# Patient Record
Sex: Male | Born: 1945
Health system: Southern US, Community
[De-identification: ages and names within clinical notes are randomized; demographics above are authoritative.]

## PROBLEM LIST (undated history)

## (undated) DIAGNOSIS — N189 Chronic kidney disease, unspecified: Secondary | ICD-10-CM

## (undated) DIAGNOSIS — M1990 Unspecified osteoarthritis, unspecified site: Secondary | ICD-10-CM

## (undated) DIAGNOSIS — J189 Pneumonia, unspecified organism: Secondary | ICD-10-CM

## (undated) DIAGNOSIS — E785 Hyperlipidemia, unspecified: Secondary | ICD-10-CM

## (undated) DIAGNOSIS — I4891 Unspecified atrial fibrillation: Secondary | ICD-10-CM

## (undated) DIAGNOSIS — K219 Gastro-esophageal reflux disease without esophagitis: Secondary | ICD-10-CM

## (undated) DIAGNOSIS — Z87442 Personal history of urinary calculi: Secondary | ICD-10-CM

## (undated) DIAGNOSIS — R51 Headache: Secondary | ICD-10-CM

## (undated) DIAGNOSIS — J302 Other seasonal allergic rhinitis: Secondary | ICD-10-CM

## (undated) DIAGNOSIS — Z9889 Other specified postprocedural states: Secondary | ICD-10-CM

## (undated) DIAGNOSIS — I2510 Atherosclerotic heart disease of native coronary artery without angina pectoris: Secondary | ICD-10-CM

## (undated) DIAGNOSIS — I10 Essential (primary) hypertension: Secondary | ICD-10-CM

## (undated) DIAGNOSIS — N200 Calculus of kidney: Secondary | ICD-10-CM

## (undated) DIAGNOSIS — Z8711 Personal history of peptic ulcer disease: Secondary | ICD-10-CM

## (undated) DIAGNOSIS — I701 Atherosclerosis of renal artery: Secondary | ICD-10-CM

## (undated) DIAGNOSIS — R0602 Shortness of breath: Secondary | ICD-10-CM

## (undated) DIAGNOSIS — R519 Headache, unspecified: Secondary | ICD-10-CM

## (undated) DIAGNOSIS — I6529 Occlusion and stenosis of unspecified carotid artery: Secondary | ICD-10-CM

## (undated) HISTORY — DX: Calculus of kidney: N20.0

## (undated) HISTORY — DX: Hyperlipidemia, unspecified: E78.5

## (undated) HISTORY — DX: Occlusion and stenosis of unspecified carotid artery: I65.29

## (undated) HISTORY — PX: LITHOTRIPSY: SUR834

## (undated) HISTORY — DX: Atherosclerosis of renal artery: I70.1

## (undated) HISTORY — DX: Other specified postprocedural states: Z98.890

---

## 2006-02-03 ENCOUNTER — Ambulatory Visit (HOSPITAL_COMMUNITY): Admission: RE | Admit: 2006-02-03 | Discharge: 2006-02-03 | Payer: Self-pay | Admitting: Family Medicine

## 2007-04-14 ENCOUNTER — Ambulatory Visit (HOSPITAL_COMMUNITY): Admission: RE | Admit: 2007-04-14 | Discharge: 2007-04-14 | Payer: Self-pay | Admitting: Family Medicine

## 2007-04-19 ENCOUNTER — Ambulatory Visit (HOSPITAL_COMMUNITY): Admission: RE | Admit: 2007-04-19 | Discharge: 2007-04-19 | Payer: Self-pay | Admitting: Family Medicine

## 2008-05-12 HISTORY — PX: RENAL ARTERY STENT: SHX2321

## 2009-02-27 ENCOUNTER — Ambulatory Visit (HOSPITAL_COMMUNITY): Admission: RE | Admit: 2009-02-27 | Discharge: 2009-02-27 | Payer: Self-pay | Admitting: Cardiovascular Disease

## 2009-03-02 ENCOUNTER — Ambulatory Visit (HOSPITAL_COMMUNITY): Admission: RE | Admit: 2009-03-02 | Discharge: 2009-03-03 | Payer: Self-pay | Admitting: Cardiovascular Disease

## 2009-03-02 HISTORY — PX: CARDIAC CATHETERIZATION: SHX172

## 2009-03-28 ENCOUNTER — Observation Stay (HOSPITAL_COMMUNITY): Admission: RE | Admit: 2009-03-28 | Discharge: 2009-03-29 | Payer: Self-pay | Admitting: Cardiovascular Disease

## 2009-03-28 HISTORY — PX: CORONARY ANGIOPLASTY WITH STENT PLACEMENT: SHX49

## 2010-08-14 LAB — CBC
HCT: 37.9 % — ABNORMAL LOW (ref 39.0–52.0)
Hemoglobin: 13.2 g/dL (ref 13.0–17.0)
MCHC: 34.7 g/dL (ref 30.0–36.0)
RBC: 4.13 MIL/uL — ABNORMAL LOW (ref 4.22–5.81)
WBC: 10.7 10*3/uL — ABNORMAL HIGH (ref 4.0–10.5)

## 2010-08-14 LAB — BASIC METABOLIC PANEL
BUN: 15 mg/dL (ref 6–23)
CO2: 26 mEq/L (ref 19–32)
Creatinine, Ser: 1.01 mg/dL (ref 0.4–1.5)
GFR calc Af Amer: >60 mL/min (ref >60)
GFR calc non Af Amer: >60 mL/min (ref >60)
Sodium: 138 mEq/L (ref 135–145)

## 2010-08-14 LAB — TROPONIN I: Troponin I: 0.05 ng/mL (ref 0.00–0.06)

## 2010-08-15 LAB — CBC
HCT: 43 % (ref 39.0–52.0)
MCHC: 34.5 g/dL (ref 30.0–36.0)
RBC: 4.7 MIL/uL (ref 4.22–5.81)
WBC: 10.8 10*3/uL — ABNORMAL HIGH (ref 4.0–10.5)

## 2010-08-15 LAB — CARDIAC PANEL(CRET KIN+CKTOT+MB+TROPI)
CK, MB: 1.8 ng/mL (ref 0.3–4.0)
Total CK: 136 U/L (ref 7–232)
Troponin I: 0.05 ng/mL (ref 0.00–0.06)

## 2010-08-15 LAB — BASIC METABOLIC PANEL
CO2: 28 mEq/L (ref 19–32)
Chloride: 101 mEq/L (ref 96–112)

## 2010-09-24 NOTE — Procedures (Signed)
NAME:  Steven Scott, Steven Scott NO.:  [id number]   MEDICAL RECORD NO.:  [id number]          PATIENT TYPE:  INP   LOCATION:  2508                         FACILITY:  [facility]   PHYSICIAN:  Quay Burow, M.D.   DATE OF BIRTH:  April 25, 1946   DATE OF PROCEDURE:  DATE OF DISCHARGE:                    PERIPHERAL VASCULAR INVASIVE PROCEDURE   PROCEDURES:  Abdominal aortogram, selective right and left renal artery  angiogram, right and left renal artery percutaneous transluminal  angioplasty and stenting.   [name] is a 65 year old gentleman with persistent hypertension and  bilateral renal artery stenosis by duplex ultrasound.  He has multiple  vascular risk factors.  He underwent cardiac catheterization with  attempted RCA PCI and stenting unsuccessfully.  He presents now for  angiography and renal artery PTA and stenting for treatment of  renovascular hypertension.   PROCEDURE DESCRIPTION:  The patient was brought to the second floor  [facility] angiographic suite in the postabsorptive state.  He had  already completed his cardiac cath lab and has been treated with  aspirin, Plavix, Pepcid and Angiomax with an ACT of 373.  A 5-French  pigtail was used for abdominal aortography.  A 5-French short right  Judkins was used for selective right and left renal artery angiography.  Visipaque dye was used for the entirety of the case.  Retrograde aortic  pressures were monitored throughout the case.   ANGIOGRAPHIC RESULTS:  1. Abdominal aorta:      a.     Renal arteries - right renal artery; 80% ostial with a 90%       pullback gradient; left renal artery - 80% proximal with a 75 mm       pullback gradient.      b.     Infrarenal abdominal aorta - normal.      c.     Iliac bifurcation - normal.   IMPRESSION:  High grade bilateral renal artery stenosis.   PROCEDURE DESCRIPTION:  Using a 6-French short right Judkins guide  catheter along with an 014-190 stabilizer wire and a 4  mm x 2 cm Aviator  balloon dilatation was performed on the left renal artery after the  stabilizer wire was passed into the subsegmental branch.  Following  this, a 6 mm x 15 mm Genesis Aviator balloon stent premount was then  deployed at 10 atmospheres resulting in reduction of an 80% stenosis to  0% residual.  Attention was then focused on the right renal artery and  using the same stabilizer wire and 4 x 2 Aviator predilatation was  performed stenting with a 5 x 15 Genesis Aviator resulting in reduction  of an 80% stenosis to 0% residual.   IMPRESSION:  Successful bilateral renal artery PTA and stenting of high  grade renal artery stenosis with 0% residual bilaterally.  The patient  tolerated the procedure well.  The guidewire and catheter were removed.  __________place.  The patient left the operating room in stable  condition.  He will be gently hydrated overnight,  treated with aspirin and Plavix and discharged home in the morning.  He  will get follow up renal  Doppler studies and will see me back in the  office in follow up to arrange for him to undergo repeat attempt at  distal RCA PCI stenting with Dr. Ellouise Newer.      Quay Burow, M.D.  Electronically Signed     JB/MEDQ  D:  03/02/2009  T:  03/02/2009  Job:  [id number]   cc:   Minna Merritts, MD  Bonne Dolores, M.D.  [facility]

## 2010-11-27 ENCOUNTER — Encounter (INDEPENDENT_AMBULATORY_CARE_PROVIDER_SITE_OTHER): Payer: Self-pay | Admitting: Internal Medicine

## 2010-12-18 ENCOUNTER — Encounter (HOSPITAL_COMMUNITY)
Admission: RE | Disposition: A | Discharge status: Home / Self Care | Payer: Self-pay | Source: Ambulatory Visit | Attending: Internal Medicine

## 2010-12-18 ENCOUNTER — Ambulatory Visit (HOSPITAL_COMMUNITY)
Admission: RE | Admit: 2010-12-18 | Discharge: 2010-12-18 | Disposition: A | Discharge status: Home / Self Care | Payer: Medicare Other | Source: Ambulatory Visit | Attending: Internal Medicine | Admitting: Internal Medicine

## 2010-12-18 ENCOUNTER — Other Ambulatory Visit (INDEPENDENT_AMBULATORY_CARE_PROVIDER_SITE_OTHER): Payer: Self-pay | Admitting: Internal Medicine

## 2010-12-18 ENCOUNTER — Encounter (INDEPENDENT_AMBULATORY_CARE_PROVIDER_SITE_OTHER): Payer: Self-pay | Admitting: Internal Medicine

## 2010-12-18 ENCOUNTER — Encounter (HOSPITAL_COMMUNITY): Payer: Self-pay | Admitting: *Deleted

## 2010-12-18 DIAGNOSIS — Z1211 Encounter for screening for malignant neoplasm of colon: Secondary | ICD-10-CM

## 2010-12-18 DIAGNOSIS — K5730 Diverticulosis of large intestine without perforation or abscess without bleeding: Secondary | ICD-10-CM | POA: Insufficient documentation

## 2010-12-18 DIAGNOSIS — D126 Benign neoplasm of colon, unspecified: Secondary | ICD-10-CM

## 2010-12-18 DIAGNOSIS — K644 Residual hemorrhoidal skin tags: Secondary | ICD-10-CM | POA: Insufficient documentation

## 2010-12-18 DIAGNOSIS — I10 Essential (primary) hypertension: Secondary | ICD-10-CM | POA: Insufficient documentation

## 2010-12-18 DIAGNOSIS — Z79899 Other long term (current) drug therapy: Secondary | ICD-10-CM | POA: Insufficient documentation

## 2010-12-18 DIAGNOSIS — Z7982 Long term (current) use of aspirin: Secondary | ICD-10-CM | POA: Insufficient documentation

## 2010-12-18 DIAGNOSIS — Z800 Family history of malignant neoplasm of digestive organs: Secondary | ICD-10-CM | POA: Insufficient documentation

## 2010-12-18 HISTORY — DX: Essential (primary) hypertension: I10

## 2010-12-18 HISTORY — DX: Atherosclerotic heart disease of native coronary artery without angina pectoris: I25.10

## 2010-12-18 HISTORY — PX: COLONOSCOPY: SHX5424

## 2010-12-18 SURGERY — COLONOSCOPY
Anesthesia: Moderate Sedation

## 2010-12-18 MED ORDER — MIDAZOLAM HCL 5 MG/5ML IJ SOLN
INTRAMUSCULAR | Status: DC | PRN
  Administered 2010-12-18 (×3): 2 mg via INTRAVENOUS

## 2010-12-18 MED ORDER — MIDAZOLAM HCL 5 MG/5ML IJ SOLN
INTRAMUSCULAR | Status: AC
  Filled 2010-12-18: qty 10

## 2010-12-18 MED ORDER — SODIUM CHLORIDE 0.45 % IV SOLN
Freq: Once | INTRAVENOUS | Status: AC
  Administered 2010-12-18: 11:00:00 via INTRAVENOUS

## 2010-12-18 MED ORDER — MEPERIDINE HCL 50 MG/ML IJ SOLN
INTRAMUSCULAR | Status: AC
  Filled 2010-12-18: qty 1

## 2010-12-18 MED ORDER — MEPERIDINE HCL 50 MG/ML IJ SOLN
INTRAMUSCULAR | Status: DC | PRN
  Administered 2010-12-18 (×2): 25 mg via INTRAVENOUS

## 2010-12-18 NOTE — H&P (Signed)
Steven Scott is an 65 y.o. male.   Chief Complaint: For colonoscopy HPI: Patient is 65 year old Caucasian male who is here for screening colonoscopy. This is his first exam. He denies abdominal pain rectal bleeding or diarrhea. He has intermittent constipation which uses MOM on prn basis. His mother had colon carcinoma in her late 80s and died of unrelated causes.  Past Medical History  Diagnosis Date  . Coronary artery disease   . Hypertension     Past Surgical History  Procedure Date  . Coronary angioplasty   . Kidney stone surgery     History reviewed. No pertinent family history. Social History:  reports that he has been smoking.  He does not have any smokeless tobacco history on file. He reports that he does not drink alcohol or use illicit drugs.  Allergies:  Allergies  Allergen Reactions  . Penicillins Rash    Medications Prior to Admission  Medication Dose Route Frequency Provider Last Rate Last Dose  . 0.45 % sodium chloride infusion   Intravenous Once Steven Houston, MD 20 mL/hr at 12/18/10 1126    . meperidine (DEMEROL) 50 MG/ML injection           . midazolam (VERSED) 5 MG/5ML injection            Medications Prior to Admission  Medication Sig Dispense Refill  . aspirin 81 MG tablet Take 81 mg by mouth daily.        . clopidogrel (PLAVIX) 75 MG tablet Take 75 mg by mouth daily.        . nebivolol (BYSTOLIC) 5 MG tablet Take 5 mg by mouth daily.        . Olmesartan-Amlodipine-HCTZ 20-5-12.5 MG TABS Take by mouth.        . simvastatin (ZOCOR) 20 MG tablet Take 20 mg by mouth at bedtime.        . nitroGLYCERIN (NITROSTAT) 0.4 MG SL tablet Place 0.4 mg under the tongue every 5 (five) minutes as needed.          No results found for this or any previous visit (from the past 48 hour(s)). No results found.  Review of Systems  Constitutional: Negative for weight loss.  Gastrointestinal: Positive for constipation (intermittent; takes MOM prn). Negative for  abdominal pain, diarrhea, blood in stool and melena.    Blood pressure 133/60, pulse 62, temperature 98 F (36.7 C), temperature source Oral, resp. rate 18, height 5\' 11"  (1.803 m), weight 232 lb (105.235 kg), SpO2 100.00%. Physical Exam  Constitutional: He appears well-developed and well-nourished.  HENT:  Mouth/Throat: Oropharynx is clear and moist.  Eyes: Conjunctivae are normal. No scleral icterus.  Neck: Neck supple. No thyromegaly present.  Cardiovascular: Normal rate, regular rhythm and normal heart sounds.   No murmur heard. Respiratory: Breath sounds normal.  GI: Soft. He exhibits no mass. There is no tenderness. There is no rebound.       protuberant  Musculoskeletal: He exhibits no edema.  Lymphadenopathy:    He has no cervical adenopathy.  Neurological: He is alert.  Skin: Skin is warm and dry.     Assessment/Plan Screening colonoscopy. Mother had CRC; she was in her late 102s. Procedure and risks reviewed with the patient and informed consent obtained  Steven Scott U 12/18/2010, 11:43 AM   4 colonoscopy

## 2010-12-18 NOTE — Op Note (Signed)
COLONOSCOPY PROCEDURE REPORT  PATIENT:  Steven Scott  MR#:  [id number] Birthdate:  1945/08/31, 65 y.o., male Endoscopist:  Dr. Rogene Houston, MD Referred By:  Dr. Elsie Lincoln. Procedure Date: 12/18/2010  Procedure:   Colonoscopy  Indications:  Screening colonoscopy. Mother had colon cancer in her 42s.  Informed Consent: The seizure and risks were reviewed with the patient. Questions were answered and informed consent was obtained.  Medications:  Demerol 50mg  IV Versed 6 mg IV  Description of procedure:  After a digital rectal exam was performed, that colonoscope was advanced from the anus through the rectum and colon to the area of the cecum, ileocecal valve and appendiceal orifice. The cecum was deeply intubated. These structures were well-seen and photographed for the record. From the level of the cecum and ileocecal valve, the scope was slowly and cautiously withdrawn. The mucosal surfaces were carefully surveyed utilizing scope tip to flexion to facilitate fold flattening as needed. The scope was pulled down into the rectum where a thorough exam including retroflexion was performed.  Findings:   Prep satisfactory. 10 mm sessile polyp at cecum; located behind a fold next to ileocecal valve. Saline assisted piecemeal polypectomy performed. Polypectomy complete. Attempted to place Hemoclip at polypectomy site but unable to do so; 2 clips were opened. Another small cecal polyp ablated via cold biopsy. Another small polyp ablated via cold biopsy from proximal transverse colon. Moderate number of diverticula at sigmoid colon. External hemorrhoids.  Therapeutic/Diagnostic Maneuvers Performed:  See above.  Complications:  None  Cecal Withdrawal Time:  39 minutes  Impression:  10 mm sessile polyp snared from cecum as described above. Polypectomy complete. 2 small polyps ablated via cold biopsy; one from the cecum and second one from the transverse colon. Moderate sigmoid  diverticulosis. External hemorrhoids.  Recommendations:  Resume aspirin on 12/19/2010. Resume Plavix on 12/21/2010. High fiber diet. Physician will contact you with biopsy results.  [name] U  12/18/2010 12:42 PM  CC: Dr.Graysin MCGOUGH.

## 2010-12-18 NOTE — Brief Op Note (Signed)
BP on arrival to post op 106/47.

## 2010-12-24 ENCOUNTER — Encounter (INDEPENDENT_AMBULATORY_CARE_PROVIDER_SITE_OTHER): Payer: Self-pay | Admitting: *Deleted

## 2010-12-25 ENCOUNTER — Encounter (HOSPITAL_COMMUNITY): Payer: Self-pay | Admitting: Internal Medicine

## 2011-02-21 ENCOUNTER — Other Ambulatory Visit (HOSPITAL_COMMUNITY): Payer: Self-pay | Admitting: Cardiovascular Disease

## 2011-02-21 ENCOUNTER — Ambulatory Visit (HOSPITAL_COMMUNITY)
Admission: RE | Admit: 2011-02-21 | Discharge: 2011-02-21 | Disposition: A | Discharge status: Home / Self Care | Payer: Medicare Other | Source: Ambulatory Visit | Attending: Cardiovascular Disease | Admitting: Cardiovascular Disease

## 2011-02-21 DIAGNOSIS — J449 Chronic obstructive pulmonary disease, unspecified: Secondary | ICD-10-CM | POA: Insufficient documentation

## 2011-02-21 DIAGNOSIS — Z01811 Encounter for preprocedural respiratory examination: Secondary | ICD-10-CM

## 2011-02-21 DIAGNOSIS — J4489 Other specified chronic obstructive pulmonary disease: Secondary | ICD-10-CM | POA: Insufficient documentation

## 2011-02-21 DIAGNOSIS — Z01818 Encounter for other preprocedural examination: Secondary | ICD-10-CM | POA: Insufficient documentation

## 2011-02-27 ENCOUNTER — Ambulatory Visit (HOSPITAL_COMMUNITY)
Admission: RE | Admit: 2011-02-27 | Discharge: 2011-02-28 | Disposition: A | Discharge status: Home / Self Care | Payer: Medicare Other | Source: Ambulatory Visit | Attending: Cardiovascular Disease | Admitting: Cardiovascular Disease

## 2011-02-27 DIAGNOSIS — Z9861 Coronary angioplasty status: Secondary | ICD-10-CM | POA: Insufficient documentation

## 2011-02-27 DIAGNOSIS — F17200 Nicotine dependence, unspecified, uncomplicated: Secondary | ICD-10-CM | POA: Insufficient documentation

## 2011-02-27 DIAGNOSIS — I10 Essential (primary) hypertension: Secondary | ICD-10-CM | POA: Insufficient documentation

## 2011-02-27 DIAGNOSIS — I701 Atherosclerosis of renal artery: Secondary | ICD-10-CM | POA: Insufficient documentation

## 2011-02-27 DIAGNOSIS — R112 Nausea with vomiting, unspecified: Secondary | ICD-10-CM | POA: Insufficient documentation

## 2011-02-27 DIAGNOSIS — I2510 Atherosclerotic heart disease of native coronary artery without angina pectoris: Secondary | ICD-10-CM | POA: Insufficient documentation

## 2011-02-27 DIAGNOSIS — Z9889 Other specified postprocedural states: Secondary | ICD-10-CM

## 2011-02-27 DIAGNOSIS — E785 Hyperlipidemia, unspecified: Secondary | ICD-10-CM | POA: Insufficient documentation

## 2011-02-27 HISTORY — DX: Other specified postprocedural states: Z98.890

## 2011-02-28 LAB — BASIC METABOLIC PANEL
BUN: 25 mg/dL — ABNORMAL HIGH (ref 6–23)
CO2: 27 mEq/L (ref 19–32)
Chloride: 104 mEq/L (ref 96–112)
Creatinine, Ser: 1.2 mg/dL (ref 0.50–1.35)
Glucose, Bld: 105 mg/dL — ABNORMAL HIGH (ref 70–99)

## 2011-02-28 LAB — CBC
HCT: 41.7 % (ref 39.0–52.0)
Hemoglobin: 14.3 g/dL (ref 13.0–17.0)
MCV: 89.5 fL (ref 78.0–100.0)
RBC: 4.66 MIL/uL (ref 4.22–5.81)
WBC: 12 10*3/uL — ABNORMAL HIGH (ref 4.0–10.5)

## 2011-03-14 NOTE — Procedures (Signed)
NAME:  NERY, DASTRUP NO.:  [id number]  MEDICAL RECORD NO.:  [id number]  LOCATION:  2505                         FACILITY:  [facility]  PHYSICIAN:  Quay Burow, M.D.   DATE OF BIRTH:  1945/09/01  DATE OF PROCEDURE:  02/27/2011 DATE OF DISCHARGE:                   PERIPHERAL VASCULAR INVASIVE PROCEDURE   PROCEDURE:  Abdominal aortogram, selective right and left renal artery angiogram, right and left renal artery cutting balloon atherectomy.  SURGEON:  Quay Burow, MD  HISTORY:  Mr. Steven Scott is a very pleasant 65 year old mildly overweight married Caucasian male, father of 2, who I last saw in the office on February 12, 2011.  He has a history of persistent hypertension with bilateral renal artery stenosis and a positive Myoview with selective heart catheterization revealing high-grade distal dominant RCA stenosis. I stented both his renal arteries successfully but was unable to cross the distal right coronary artery which Dr. Claiborne Billings subsequently performed successfully on March 28, 2008, using Xience drug-eluting stents.  He clinically improved.  His lipid profile was excellent.  Recent Dopplers showed progression of renal artery stenosis suggesting high-grade in- stent restenosis which has been progressive.  He presents now for angiography and potential reintervention for renal preservation.  PROCEDURE DESCRIPTION #1:  The patient was brought to the second floor [facility] angiographic suite in the postabsorptive state.  He was premedicated with p.o. Valium, IV Versed, and fentanyl.  His right groin was prepped and shaved in the usual sterile fashion.  Xylocaine 1% was used for local anesthesia.  A 5-French sheath was inserted into the right femoral artery using standard Seldinger technique.  A 5-French tennis racket catheter was used for abdominal aortography.  A 5-French short right Judkins catheter was used for selective right and left renal artery  angiography.  Visipaque dye was used for the entirety of the case.  Retrograde aortic pressures were monitored during the case. Pullback gradients were performed across the right and left renal artery (40 mm right, 16 mm left).  ANGIOGRAPHIC RESULTS: 1. Right renal artery; 90% fairly focal right renal artery in-stent     restenosis with a 16 mm pullback gradient. 2. Left renal artery; 70% left renal artery "in-stent restenosis" with     a 40 mm pullback gradient.  PROCEDURE DESCRIPTION #2:  The patient received 5000 units of heparin intravenously with an ACT of 243.  Total of 94 mL contrast was used for the case.  Using a 6-French short JR-4 guide catheter with an 0.014, 300 mm long Spartacore wire and a 5 mm x 2 cm long angioscoped atherectomy catheter cutting balloon atherectomy was performed of both renal artery stents for "in-stent restenosis."  The balloons were inflated to 10 and 12 atmospheres, in each renal artery.  The final angiographic result was reduction of a 90% fairly focal left renal artery in-stent restenosis to less than 20% residual and a 70% left to less than 20% "in-stent restenosis" residual.  No complications.  The guidewire and catheter were removed.  The patient left the lab in stable condition.  Plans will be to hydrate him overnight, discharge him in the morning.  He will be treated with aspirin and Plavix.  Followup renal Dopplers will be obtained  and the patient will be seen back in the office.  He left the lab in stable condition.     Quay Burow, M.D.     JB/MEDQ  D:  02/27/2011  T:  02/27/2011  Job:  [id number]  cc:   Second Floor [facility] Leonides Grills, M.D.  Electronically Signed by Quay Burow M.D. on 03/14/2011 05:23:51 PM

## 2011-03-14 NOTE — Discharge Summary (Signed)
NAME:  Steven Scott, MAREZ NO.:  [id number]  MEDICAL RECORD NO.:  [id number]  LOCATION:  2505                         FACILITY:  [facility]  PHYSICIAN:  Quay Burow, M.D.   DATE OF BIRTH:  1945/09/11  DATE OF ADMISSION:  02/27/2011 DATE OF DISCHARGE:  02/28/2011                              DISCHARGE SUMMARY   DISCHARGE DIAGNOSES: 1. Renal artery disease, now with in-stent restenosis by Dopplers.     a.     Right renal artery with 90% focal in-stent restenosis      undergoing cutting balloon atherectomy for renal preservation.     b.     Left renal artery stenosis of 70% in-stent restenosis      undergoing cutting balloon atherectomy for renal preservation. 2. Coronary artery disease with history of stent to the right coronary     artery. 3. History of resistant hypertension. 4. Dyslipidemia, treated. 5. Nausea and vomiting last p.m., most likely for medications received     after cardiac cath, improved today. 6. Tobacco use with tobacco cessation consult.  DISCHARGE CONDITION:  Good.  DISCHARGE MEDICATIONS:  See medication reconciliation sheet, but no medication changes as the patient is on both aspirin and Plavix.  PROCEDURE:  PV angiogram, February 27, 2011 by Dr. Gwenlyn Found.  February 27, 2011, cutting balloon atherectomy of the bilateral renal artery stenosis, right renal artery 90% reduced to less than 20, left renal artery 70% stenosis reduced to less than 20.  HOSPITAL COURSE:  A 65 year old white male with a history of resistant hypertension and bilateral renal artery stenosis and a recently positive Myoview stress test leading to initial cardiac catheterization and Xience drug-eluting stent was placed to the RCA stenosis.  This was done back in 2009.  Recent renal Dopplers performed September 21 revealed progression of disease in both renal arteries suggesting high-grade in-stent restenosis within the right renal and moderate disease within the left.  He  was then set up for outpatient renal angiogram and potential Reintervention for renal preservation.  The patient presented, underwent procedure, cutting balloon atherectomy bilaterally.  Tolerated procedure well. Overnight, he had no complaints except for some nausea and vomiting.  By the morning of June 30, 2010, he was stable and ready for discharge. He was seen and evaluated by Dr. Debara Pickett.  LABORATORY DATA:  Sodium was 141, potassium 3.9, BUN 25, creatinine 1.20, glucose 105, calcium 9.4. Hemoglobin 14.3, hematocrit 41.7, WBC was 12, and platelets were 175.  PHYSICAL EXAMINATION:  VITAL SIGNS:  Blood pressure 144/64 pulse 67, respirations 16, temp 98.1, oxygen saturation 100% on room air. HEART:  Regular rate and rhythm, S1, S2. LUNGS:  Clear. ABDOMEN:  Soft, nontender, positive bowel sounds. EXTREMITIES:  Groin with mild ecchymosis, but no hematoma.  No bruits. The patient was stable for discharge.  DISCHARGE MEDICATIONS:  As stated.  DISCHARGE INSTRUCTIONS:  Increase activity slowly.  May shower.  Nolifting for 1 week.  No driving for 2 days.  Low-sodium, heart healthy diet.  Wash cath site with soap and water.  Call if any bleeding, swelling, or drainage.  1. Follow up with Dr. Gwenlyn Found in [place] on March 21, 2011 at 4:15  am. 2. Follow up Doppler of kidneys on March 12, 2011, at 11:30 am in     [place].     Otilio Carpen. Dorene Ar, N.P.   ______________________________ Quay Burow, M.D.    LRI/MEDQ  D:  02/28/2011  T:  02/28/2011  Job:  [id number]  cc:   Leonides Grills, M.D.  Electronically Signed by Cecilie Kicks N.P. on 02/28/2011 06:10:57 PM Electronically Signed by Quay Burow M.D. on 03/14/2011 05:23:48 PM

## 2011-06-17 DIAGNOSIS — I701 Atherosclerosis of renal artery: Secondary | ICD-10-CM | POA: Diagnosis not present

## 2011-06-17 DIAGNOSIS — I10 Essential (primary) hypertension: Secondary | ICD-10-CM | POA: Diagnosis not present

## 2011-08-14 DIAGNOSIS — I2510 Atherosclerotic heart disease of native coronary artery without angina pectoris: Secondary | ICD-10-CM | POA: Diagnosis not present

## 2011-08-14 DIAGNOSIS — I10 Essential (primary) hypertension: Secondary | ICD-10-CM | POA: Diagnosis not present

## 2011-08-14 DIAGNOSIS — E785 Hyperlipidemia, unspecified: Secondary | ICD-10-CM | POA: Diagnosis not present

## 2011-08-14 DIAGNOSIS — Z6834 Body mass index (BMI) 34.0-34.9, adult: Secondary | ICD-10-CM | POA: Diagnosis not present

## 2011-09-17 DIAGNOSIS — E782 Mixed hyperlipidemia: Secondary | ICD-10-CM | POA: Diagnosis not present

## 2011-09-17 DIAGNOSIS — I701 Atherosclerosis of renal artery: Secondary | ICD-10-CM | POA: Diagnosis not present

## 2011-09-17 DIAGNOSIS — I2510 Atherosclerotic heart disease of native coronary artery without angina pectoris: Secondary | ICD-10-CM | POA: Diagnosis not present

## 2011-09-17 DIAGNOSIS — I10 Essential (primary) hypertension: Secondary | ICD-10-CM | POA: Diagnosis not present

## 2012-01-02 DIAGNOSIS — I701 Atherosclerosis of renal artery: Secondary | ICD-10-CM | POA: Diagnosis not present

## 2012-01-20 DIAGNOSIS — L570 Actinic keratosis: Secondary | ICD-10-CM | POA: Diagnosis not present

## 2012-01-20 DIAGNOSIS — D485 Neoplasm of uncertain behavior of skin: Secondary | ICD-10-CM | POA: Diagnosis not present

## 2012-01-20 DIAGNOSIS — L905 Scar conditions and fibrosis of skin: Secondary | ICD-10-CM | POA: Diagnosis not present

## 2012-01-20 DIAGNOSIS — L408 Other psoriasis: Secondary | ICD-10-CM | POA: Diagnosis not present

## 2012-01-27 DIAGNOSIS — E785 Hyperlipidemia, unspecified: Secondary | ICD-10-CM | POA: Diagnosis not present

## 2012-01-27 DIAGNOSIS — Z7182 Exercise counseling: Secondary | ICD-10-CM | POA: Diagnosis not present

## 2012-01-27 DIAGNOSIS — Z713 Dietary counseling and surveillance: Secondary | ICD-10-CM | POA: Diagnosis not present

## 2012-01-27 DIAGNOSIS — I10 Essential (primary) hypertension: Secondary | ICD-10-CM | POA: Diagnosis not present

## 2012-02-06 DIAGNOSIS — Z23 Encounter for immunization: Secondary | ICD-10-CM | POA: Diagnosis not present

## 2012-04-19 DIAGNOSIS — I2510 Atherosclerotic heart disease of native coronary artery without angina pectoris: Secondary | ICD-10-CM | POA: Diagnosis not present

## 2012-04-19 DIAGNOSIS — I10 Essential (primary) hypertension: Secondary | ICD-10-CM | POA: Diagnosis not present

## 2012-04-19 DIAGNOSIS — I701 Atherosclerosis of renal artery: Secondary | ICD-10-CM | POA: Diagnosis not present

## 2012-04-19 DIAGNOSIS — E782 Mixed hyperlipidemia: Secondary | ICD-10-CM | POA: Diagnosis not present

## 2012-04-29 DIAGNOSIS — E782 Mixed hyperlipidemia: Secondary | ICD-10-CM | POA: Diagnosis not present

## 2012-04-29 DIAGNOSIS — Z79899 Other long term (current) drug therapy: Secondary | ICD-10-CM | POA: Diagnosis not present

## 2012-09-07 DIAGNOSIS — E669 Obesity, unspecified: Secondary | ICD-10-CM | POA: Diagnosis not present

## 2012-09-07 DIAGNOSIS — Z713 Dietary counseling and surveillance: Secondary | ICD-10-CM | POA: Diagnosis not present

## 2012-09-07 DIAGNOSIS — I10 Essential (primary) hypertension: Secondary | ICD-10-CM | POA: Diagnosis not present

## 2012-09-07 DIAGNOSIS — J019 Acute sinusitis, unspecified: Secondary | ICD-10-CM | POA: Diagnosis not present

## 2012-09-07 DIAGNOSIS — E785 Hyperlipidemia, unspecified: Secondary | ICD-10-CM | POA: Diagnosis not present

## 2012-09-07 DIAGNOSIS — Z7182 Exercise counseling: Secondary | ICD-10-CM | POA: Diagnosis not present

## 2012-09-26 ENCOUNTER — Encounter: Payer: Self-pay | Admitting: *Deleted

## 2012-10-20 ENCOUNTER — Other Ambulatory Visit (HOSPITAL_COMMUNITY): Payer: Self-pay | Admitting: Nephrology

## 2012-10-20 DIAGNOSIS — R809 Proteinuria, unspecified: Secondary | ICD-10-CM | POA: Diagnosis not present

## 2012-10-20 DIAGNOSIS — G4730 Sleep apnea, unspecified: Secondary | ICD-10-CM | POA: Diagnosis not present

## 2012-10-20 DIAGNOSIS — N289 Disorder of kidney and ureter, unspecified: Secondary | ICD-10-CM

## 2012-10-20 DIAGNOSIS — I10 Essential (primary) hypertension: Secondary | ICD-10-CM | POA: Diagnosis not present

## 2012-11-04 ENCOUNTER — Ambulatory Visit (HOSPITAL_COMMUNITY)
Admission: RE | Admit: 2012-11-04 | Discharge: 2012-11-04 | Disposition: A | Discharge status: Home / Self Care | Payer: Medicare Other | Source: Ambulatory Visit | Attending: Nephrology | Admitting: Nephrology

## 2012-11-04 DIAGNOSIS — N189 Chronic kidney disease, unspecified: Secondary | ICD-10-CM | POA: Insufficient documentation

## 2012-11-04 DIAGNOSIS — E559 Vitamin D deficiency, unspecified: Secondary | ICD-10-CM | POA: Diagnosis not present

## 2012-11-04 DIAGNOSIS — R809 Proteinuria, unspecified: Secondary | ICD-10-CM | POA: Diagnosis not present

## 2012-11-04 DIAGNOSIS — N289 Disorder of kidney and ureter, unspecified: Secondary | ICD-10-CM

## 2012-11-04 DIAGNOSIS — I10 Essential (primary) hypertension: Secondary | ICD-10-CM | POA: Diagnosis not present

## 2012-11-04 DIAGNOSIS — Z79899 Other long term (current) drug therapy: Secondary | ICD-10-CM | POA: Diagnosis not present

## 2012-11-04 DIAGNOSIS — I259 Chronic ischemic heart disease, unspecified: Secondary | ICD-10-CM | POA: Diagnosis not present

## 2012-11-05 ENCOUNTER — Ambulatory Visit (INDEPENDENT_AMBULATORY_CARE_PROVIDER_SITE_OTHER): Payer: Medicare Other | Admitting: Cardiovascular Disease

## 2012-11-05 ENCOUNTER — Encounter: Payer: Self-pay | Admitting: Cardiovascular Disease

## 2012-11-05 VITALS — BP 152/88 | HR 60 | Ht 71.0 in | Wt 253.0 lb

## 2012-11-05 DIAGNOSIS — E785 Hyperlipidemia, unspecified: Secondary | ICD-10-CM | POA: Diagnosis not present

## 2012-11-05 DIAGNOSIS — I2510 Atherosclerotic heart disease of native coronary artery without angina pectoris: Secondary | ICD-10-CM | POA: Insufficient documentation

## 2012-11-05 DIAGNOSIS — I10 Essential (primary) hypertension: Secondary | ICD-10-CM | POA: Diagnosis not present

## 2012-11-05 NOTE — Assessment & Plan Note (Signed)
Status post distal RCA PCI and stenting using his eye and struggling stent by Dr.Tom Claiborne Billings 03/28/2009. He had no significant residual disease.he denies chest pain or shortness of breath.

## 2012-11-05 NOTE — Progress Notes (Signed)
11/05/2012 Steven Scott   [birth year]  [id number]  Primary Physician Leonides Grills, MD Primary Cardiologist: Steven Harp MD Steven Scott   HPI:  The patient is a very pleasant 67 year old, moderately overweight, married Caucasian male, father of 2 who I last saw in the office 6 months ago. He has a history of persistent hypertension status post bilateral renal artery PTA and stenting with a positive Myoview, as well as heart catheterization that showed a high-grade distal RCA stenosis. I stented both his renal arteries successfully but was unable to cross his distal RCA, which Dr. Claiborne Billings subsequently performed successfully on March 28, 2008, with a Xience V drug-eluting stent with an excellent result. He clinically improved. He had an excellent lipid profile. Renal Dopplers performed January 31, 2011, showed progression of disease in both renal arteries suggesting high "in-stent restenosis." I performed angiography on him October 18 revealing 90% right and 70% left in-stent restenosis with 60 and 40-mm gradients, respectively. He underwent AngioSculpt cutting balloon atherectomy of both renal arteries with an excellent angiographic and followup Doppler result. His blood pressure has remained stable. He is otherwise asymptomatic. He was changed to generic antihypertensive medications off of Tribenzor and apparently his blood pressures have been more difficult to control since that time. He does admit to dietary indiscretion with regards to salt as well.   He apparently was sent to a nephrologist because of renal insufficiency. It sounds like renal ultrasound performed to rule out obstruction His last renal Doppler study in our office performed 01/02/12 revealed patent stents with normal renal sizes.      Current Outpatient Prescriptions  Medication Sig Dispense Refill  . aspirin 81 MG tablet Take 81 mg by mouth daily.      . clopidogrel (PLAVIX) 75 MG tablet Take 75 mg by  mouth daily.      . nebivolol (BYSTOLIC) 5 MG tablet Take 5 mg by mouth daily.        . nitroGLYCERIN (NITROSTAT) 0.4 MG SL tablet Place 0.4 mg under the tongue every 5 (five) minutes as needed.        . Olmesartan-Amlodipine-HCTZ (TRIBENZOR) 40-10-25 MG TABS Take 1 tablet by mouth daily.      . simvastatin (ZOCOR) 20 MG tablet Take 20 mg by mouth at bedtime.         No current facility-administered medications for this visit.    Allergies  Allergen Reactions  . Penicillins Rash    History   Social History  . Marital Status: Married    Spouse Name: N/A    Number of Children: N/A  . Years of Education: N/A   Occupational History  . Not on file.   Social History Main Topics  . Smoking status: Former Smoker -- 1.00 packs/day    Quit date: 11/06/2011  . Smokeless tobacco: Not on file  . Alcohol Use: No  . Drug Use: No  . Sexually Active: Not on file   Other Topics Concern  . Not on file   Social History Narrative  . No narrative on file     Review of Systems: General: negative for chills, fever, night sweats or weight changes.  Cardiovascular: negative for chest pain, dyspnea on exertion, edema, orthopnea, palpitations, paroxysmal nocturnal dyspnea or shortness of breath Dermatological: negative for rash Respiratory: negative for cough or wheezing Urologic: negative for hematuria Abdominal: negative for nausea, vomiting, diarrhea, bright red blood per rectum, melena, or hematemesis Neurologic: negative for visual changes, syncope, or dizziness All  other systems reviewed and are otherwise negative except as noted above.    Blood pressure 152/88, pulse 60, height 5\' 11"  (1.803 m), weight 253 lb (114.76 kg).  General appearance: alert and no distress Neck: no adenopathy, no carotid bruit, no JVD, supple, symmetrical, trachea midline and thyroid not enlarged, symmetric, no tenderness/mass/nodules Lungs: clear to auscultation bilaterally Heart: regular rate and rhythm,  S1, S2 normal, no murmur, click, rub or gallop Extremities: extremities normal, atraumatic, no cyanosis or edema  EKG sinus rhythm at 60 with lateral T-wave inversion unchanged from prior EKGs  ASSESSMENT AND PLAN:   Coronary artery disease Status post distal RCA PCI and stenting using his eye and struggling stent by Dr.Tom Claiborne Billings 03/28/2009. He had no significant residual disease.he denies chest pain or shortness of breath.  Essential hypertension Under good control on current medications  Hyperlipidemia On statin therapy followed by PCP      Steven Harp MD [facility] 11/05/2012 11:26 AM

## 2012-11-05 NOTE — Assessment & Plan Note (Signed)
On statin therapy followed by PCP

## 2012-11-05 NOTE — Patient Instructions (Addendum)
  We will see you back in follow up in 6 months with an extender and 12 months with Dr Gwenlyn Found  Dr Gwenlyn Found has ordered renal dopplers (ultrasound of your kidney arteries)

## 2012-11-05 NOTE — Assessment & Plan Note (Signed)
Under good control on current medications 

## 2012-11-17 DIAGNOSIS — G4730 Sleep apnea, unspecified: Secondary | ICD-10-CM | POA: Diagnosis not present

## 2012-11-17 DIAGNOSIS — N289 Disorder of kidney and ureter, unspecified: Secondary | ICD-10-CM | POA: Diagnosis not present

## 2012-11-17 DIAGNOSIS — H908 Mixed conductive and sensorineural hearing loss, unspecified: Secondary | ICD-10-CM | POA: Diagnosis not present

## 2012-11-17 DIAGNOSIS — H6690 Otitis media, unspecified, unspecified ear: Secondary | ICD-10-CM | POA: Diagnosis not present

## 2012-11-19 ENCOUNTER — Ambulatory Visit (HOSPITAL_COMMUNITY)
Admission: RE | Admit: 2012-11-19 | Discharge: 2012-11-19 | Disposition: A | Discharge status: Home / Self Care | Payer: Medicare Other | Source: Ambulatory Visit | Attending: Cardiovascular Disease | Admitting: Cardiovascular Disease

## 2012-11-19 DIAGNOSIS — I10 Essential (primary) hypertension: Secondary | ICD-10-CM

## 2012-11-19 NOTE — Progress Notes (Signed)
Renal Artery Duplex Completed. °Steven Scott ° °

## 2012-11-21 DIAGNOSIS — N189 Chronic kidney disease, unspecified: Secondary | ICD-10-CM | POA: Diagnosis not present

## 2012-11-21 DIAGNOSIS — R259 Unspecified abnormal involuntary movements: Secondary | ICD-10-CM | POA: Diagnosis not present

## 2012-11-21 DIAGNOSIS — G4761 Periodic limb movement disorder: Secondary | ICD-10-CM | POA: Diagnosis not present

## 2012-11-21 DIAGNOSIS — G4730 Sleep apnea, unspecified: Secondary | ICD-10-CM | POA: Diagnosis not present

## 2012-11-21 DIAGNOSIS — Z79899 Other long term (current) drug therapy: Secondary | ICD-10-CM | POA: Diagnosis not present

## 2012-11-21 DIAGNOSIS — Z87891 Personal history of nicotine dependence: Secondary | ICD-10-CM | POA: Diagnosis not present

## 2012-11-21 DIAGNOSIS — I129 Hypertensive chronic kidney disease with stage 1 through stage 4 chronic kidney disease, or unspecified chronic kidney disease: Secondary | ICD-10-CM | POA: Diagnosis not present

## 2012-11-21 DIAGNOSIS — Z6839 Body mass index (BMI) 39.0-39.9, adult: Secondary | ICD-10-CM | POA: Diagnosis not present

## 2012-11-21 DIAGNOSIS — G4710 Hypersomnia, unspecified: Secondary | ICD-10-CM | POA: Diagnosis not present

## 2012-11-29 DIAGNOSIS — R259 Unspecified abnormal involuntary movements: Secondary | ICD-10-CM | POA: Diagnosis not present

## 2012-11-29 DIAGNOSIS — G4710 Hypersomnia, unspecified: Secondary | ICD-10-CM | POA: Diagnosis not present

## 2012-12-01 DIAGNOSIS — R894 Abnormal immunological findings in specimens from other organs, systems and tissues: Secondary | ICD-10-CM | POA: Diagnosis not present

## 2012-12-01 DIAGNOSIS — I10 Essential (primary) hypertension: Secondary | ICD-10-CM | POA: Diagnosis not present

## 2012-12-01 DIAGNOSIS — I701 Atherosclerosis of renal artery: Secondary | ICD-10-CM | POA: Diagnosis not present

## 2012-12-08 ENCOUNTER — Encounter (HOSPITAL_COMMUNITY): Payer: Self-pay | Admitting: Pharmacy Technician

## 2012-12-08 ENCOUNTER — Encounter: Payer: Self-pay | Admitting: Cardiovascular Disease

## 2012-12-08 ENCOUNTER — Ambulatory Visit (INDEPENDENT_AMBULATORY_CARE_PROVIDER_SITE_OTHER): Payer: Medicare Other | Admitting: Cardiovascular Disease

## 2012-12-08 VITALS — BP 136/76 | HR 66 | Ht 71.0 in | Wt 255.5 lb

## 2012-12-08 DIAGNOSIS — I701 Atherosclerosis of renal artery: Secondary | ICD-10-CM | POA: Diagnosis not present

## 2012-12-08 DIAGNOSIS — Z79899 Other long term (current) drug therapy: Secondary | ICD-10-CM

## 2012-12-08 DIAGNOSIS — I2510 Atherosclerotic heart disease of native coronary artery without angina pectoris: Secondary | ICD-10-CM

## 2012-12-08 DIAGNOSIS — D689 Coagulation defect, unspecified: Secondary | ICD-10-CM

## 2012-12-08 DIAGNOSIS — I10 Essential (primary) hypertension: Secondary | ICD-10-CM

## 2012-12-08 DIAGNOSIS — Z01810 Encounter for preprocedural cardiovascular examination: Secondary | ICD-10-CM

## 2012-12-08 NOTE — Assessment & Plan Note (Signed)
Status post bilateral renal artery stenting and subsequent cutting balloon atherectomy for "in-stent restenosis. Recent renal Dopplers performed 11/19/12 suggest progression of disease on the left. His creatinine has increased to 2. Based on this as well as labile hypertension I'm going to admit him the night before his procedure for hydration and hold his try benzoyl or prior to that. We will attempt revascularization of his left renal artery. I may implant a "covered stent".

## 2012-12-08 NOTE — Assessment & Plan Note (Signed)
Status post stenting of his distal RCA by Dr. Nona Dell 03/28/09. He denies chest pain or shortness of breath.

## 2012-12-08 NOTE — Patient Instructions (Addendum)
Your physician has requested that you have a peripheral vascular angiogram. This exam is performed at the hospital. During this exam IV contrast is used to look at arterial blood flow. Please review the information sheet given for details.  Your physician recommends that you return for lab work in:  7 days before procedure  A chest x-ray takes a picture of the organs and structures inside the chest, including the heart, lungs, and blood vessels. This test can show several things, including, whether the heart is enlarges; whether fluid is building up in the lungs; and whether pacemaker / defibrillator leads are still in place.  Go to hospital night before for hydration

## 2012-12-08 NOTE — Progress Notes (Signed)
12/08/2012 Steven Scott   10/21/1945  SN:[id number]  Primary Physician Leonides Grills, MD Primary Cardiologist: [name] MD [name]  HPI:  The patient is a very pleasant 67 year old, moderately overweight, married Caucasian male, father of 2 who I last saw in the office 6 months ago. He has a history of persistent hypertension status post bilateral renal artery PTA and stenting with a positive Myoview, as well as heart catheterization that showed a high-grade distal RCA stenosis. I stented both his renal arteries successfully but was unable to cross his distal RCA, which Dr. Claiborne Billings subsequently performed successfully on March 28, 2008, with a Xience V drug-eluting stent with an excellent result. He clinically improved. He had an excellent lipid profile. Renal Dopplers performed January 31, 2011, showed progression of disease in both renal arteries suggesting high "in-stent restenosis." I performed angiography on him October 18 revealing 90% right and 70% left in-stent restenosis with 60 and 40-mm gradients, respectively. He underwent AngioSculpt cutting balloon atherectomy of both renal arteries with an excellent angiographic and followup Doppler result. His blood pressure has remained stable. He is otherwise asymptomatic. He was changed to generic antihypertensive medications off of Tribenzor and apparently his blood pressures have been more difficult to control since that time. He does admit to dietary indiscretion with regards to salt as well.   I saw [name] 04/19/12. He denies chest pain or shortness of breath. Apparently his creatinine has slowly increased now to close to 2. Recent renal Dopplers performed in our office 11/19/12 suggest rapid progression of the in-stent restenosis and throat within the left renal artery stent with a renal/aortic ratio of 7.33. Based on this, I will arrange for him to be admitted the day before his upcoming procedure for hydration.  We'll hold his TriBenzor  and attempt revascularization of his left renal artery.      Current Outpatient Prescriptions  Medication Sig Dispense Refill  . aspirin 81 MG tablet Take 81 mg by mouth daily.      . clopidogrel (PLAVIX) 75 MG tablet Take 75 mg by mouth daily.      . nebivolol (BYSTOLIC) 5 MG tablet Take 5 mg by mouth daily.        . nitroGLYCERIN (NITROSTAT) 0.4 MG SL tablet Place 0.4 mg under the tongue every 5 (five) minutes as needed.        Marland Kitchen ofloxacin (FLOXIN) 0.3 % otic solution Place 10 drops into the right ear daily.      . Olmesartan-Amlodipine-HCTZ (TRIBENZOR) 40-10-25 MG TABS Take 1 tablet by mouth daily.      . simvastatin (ZOCOR) 20 MG tablet Take 20 mg by mouth at bedtime.         No current facility-administered medications for this visit.    Allergies  Allergen Reactions  . Penicillins Rash    History   Social History  . Marital Status: Married    Spouse Name: N/A    Number of Children: N/A  . Years of Education: N/A   Occupational History  . Not on file.   Social History Main Topics  . Smoking status: Former Smoker -- 2.00 packs/day for 50 years    Types: Cigarettes    Quit date: 02/24/2011  . Smokeless tobacco: Never Used  . Alcohol Use: No  . Drug Use: No  . Sexually Active: Not on file   Other Topics Concern  . Not on file   Social History Narrative  . No narrative on file  Review of Systems: General: negative for chills, fever, night sweats or weight changes.  Cardiovascular: negative for chest pain, dyspnea on exertion, edema, orthopnea, palpitations, paroxysmal nocturnal dyspnea or shortness of breath Dermatological: negative for rash Respiratory: negative for cough or wheezing Urologic: negative for hematuria Abdominal: negative for nausea, vomiting, diarrhea, bright red blood per rectum, melena, or hematemesis Neurologic: negative for visual changes, syncope, or dizziness All other systems reviewed and are otherwise  negative except as noted above.    Blood pressure 136/76, pulse 66, height 5\' 11"  (1.803 m), weight 255 lb 8 oz (115.894 kg).  General appearance: alert and no distress Neck: no adenopathy, no carotid bruit, no JVD, supple, symmetrical, trachea midline and thyroid not enlarged, symmetric, no tenderness/mass/nodules Lungs: clear to auscultation bilaterally Heart: regular rate and rhythm, S1, S2 normal, no murmur, click, rub or gallop Extremities: extremities normal, atraumatic, no cyanosis or edema  EKG not performed today  ASSESSMENT AND PLAN:   Renal artery stenosis Status post bilateral renal artery stenting and subsequent cutting balloon atherectomy for "in-stent restenosis. Recent renal Dopplers performed 11/19/12 suggest progression of disease on the left. His creatinine has increased to 2. Based on this as well as labile hypertension I'm going to admit him the night before his procedure for hydration and hold his try benzoyl or prior to that. We will attempt revascularization of his left renal artery. I may implant a "covered stent".  Coronary artery disease Status post stenting of his distal RCA by Dr. Nona Dell 03/28/09. He denies chest pain or shortness of breath.      [name] MD FACP,FACC,FAHA, [facility] 12/08/2012 1:26 PM

## 2012-12-09 ENCOUNTER — Ambulatory Visit (HOSPITAL_COMMUNITY)
Admission: RE | Admit: 2012-12-09 | Discharge: 2012-12-09 | Disposition: A | Discharge status: Home / Self Care | Payer: Medicare Other | Source: Ambulatory Visit | Attending: Cardiovascular Disease | Admitting: Cardiovascular Disease

## 2012-12-09 DIAGNOSIS — Z01810 Encounter for preprocedural cardiovascular examination: Secondary | ICD-10-CM

## 2012-12-09 DIAGNOSIS — J438 Other emphysema: Secondary | ICD-10-CM | POA: Diagnosis not present

## 2012-12-09 DIAGNOSIS — Z01818 Encounter for other preprocedural examination: Secondary | ICD-10-CM | POA: Insufficient documentation

## 2012-12-09 DIAGNOSIS — Z79899 Other long term (current) drug therapy: Secondary | ICD-10-CM | POA: Diagnosis not present

## 2012-12-09 DIAGNOSIS — D689 Coagulation defect, unspecified: Secondary | ICD-10-CM | POA: Diagnosis not present

## 2012-12-09 LAB — CBC
HCT: 40.2 % (ref 39.0–52.0)
Hemoglobin: 13.9 g/dL (ref 13.0–17.0)
RDW: 14.7 % (ref 11.5–15.5)
WBC: 8.6 10*3/uL (ref 4.0–10.5)

## 2012-12-09 LAB — COMPREHENSIVE METABOLIC PANEL
Albumin: 4.1 g/dL (ref 3.5–5.2)
BUN: 25 mg/dL — ABNORMAL HIGH (ref 6–23)
CO2: 29 mEq/L (ref 19–32)
Calcium: 9.4 mg/dL (ref 8.4–10.5)
Chloride: 106 mEq/L (ref 96–112)
Creat: 1.74 mg/dL — ABNORMAL HIGH (ref 0.50–1.35)
Potassium: 5 mEq/L (ref 3.5–5.3)

## 2012-12-10 LAB — PROTIME-INR
INR: 1.12 (ref <1.50)
Prothrombin Time: 14.4 seconds (ref 11.6–15.2)

## 2012-12-10 LAB — APTT: aPTT: 41 seconds — ABNORMAL HIGH (ref 24–37)

## 2012-12-13 ENCOUNTER — Telehealth: Payer: Self-pay | Admitting: Cardiovascular Disease

## 2012-12-13 ENCOUNTER — Ambulatory Visit (HOSPITAL_COMMUNITY)
Admission: RE | Admit: 2012-12-13 | Discharge: 2012-12-15 | Disposition: A | Discharge status: Home / Self Care | Payer: Medicare Other | Source: Ambulatory Visit | Attending: Cardiovascular Disease | Admitting: Cardiovascular Disease

## 2012-12-13 ENCOUNTER — Encounter (HOSPITAL_COMMUNITY): Payer: Self-pay | Admitting: General Practice

## 2012-12-13 DIAGNOSIS — I10 Essential (primary) hypertension: Secondary | ICD-10-CM | POA: Diagnosis not present

## 2012-12-13 DIAGNOSIS — I701 Atherosclerosis of renal artery: Principal | ICD-10-CM | POA: Insufficient documentation

## 2012-12-13 DIAGNOSIS — E785 Hyperlipidemia, unspecified: Secondary | ICD-10-CM | POA: Insufficient documentation

## 2012-12-13 DIAGNOSIS — Y831 Surgical operation with implant of artificial internal device as the cause of abnormal reaction of the patient, or of later complication, without mention of misadventure at the time of the procedure: Secondary | ICD-10-CM | POA: Insufficient documentation

## 2012-12-13 DIAGNOSIS — Z79899 Other long term (current) drug therapy: Secondary | ICD-10-CM | POA: Insufficient documentation

## 2012-12-13 DIAGNOSIS — I2510 Atherosclerotic heart disease of native coronary artery without angina pectoris: Secondary | ICD-10-CM

## 2012-12-13 DIAGNOSIS — I70209 Unspecified atherosclerosis of native arteries of extremities, unspecified extremity: Secondary | ICD-10-CM | POA: Insufficient documentation

## 2012-12-13 DIAGNOSIS — E663 Overweight: Secondary | ICD-10-CM | POA: Insufficient documentation

## 2012-12-13 HISTORY — DX: Shortness of breath: R06.02

## 2012-12-13 MED ORDER — SODIUM CHLORIDE 0.9 % IJ SOLN
3.0000 mL | Freq: Two times a day (BID) | INTRAMUSCULAR | Status: DC
  Administered 2012-12-13: 3 mL via INTRAVENOUS

## 2012-12-13 MED ORDER — SODIUM CHLORIDE 0.9 % IJ SOLN: 3.0000 mL | INTRAMUSCULAR | Status: DC | PRN

## 2012-12-13 MED ORDER — NITROGLYCERIN 0.4 MG SL SUBL: 0.4000 mg | SUBLINGUAL_TABLET | SUBLINGUAL | Status: DC | PRN

## 2012-12-13 MED ORDER — SIMVASTATIN 20 MG PO TABS
20.0000 mg | ORAL_TABLET | Freq: Every day | ORAL | Status: DC
Start: 2012-12-13 — End: 2012-12-15
  Administered 2012-12-13 – 2012-12-14 (×2): 20 mg via ORAL
  Filled 2012-12-13 (×4): qty 1

## 2012-12-13 MED ORDER — ASPIRIN 81 MG PO CHEW
324.0000 mg | CHEWABLE_TABLET | ORAL | Status: AC
  Administered 2012-12-14: 324 mg via ORAL
  Filled 2012-12-13: qty 4

## 2012-12-13 MED ORDER — OFLOXACIN 0.3 % OT SOLN: 10.0000 [drp] | Freq: Every day | OTIC | Status: DC

## 2012-12-13 MED ORDER — CLOPIDOGREL BISULFATE 75 MG PO TABS
75.0000 mg | ORAL_TABLET | Freq: Every day | ORAL | Status: DC
  Administered 2012-12-13 – 2012-12-14 (×2): 75 mg via ORAL
  Filled 2012-12-13 (×2): qty 1

## 2012-12-13 MED ORDER — OFLOXACIN 0.3 % OT SOLN
5.0000 [drp] | Freq: Every day | OTIC | Status: DC
  Administered 2012-12-14: 5 [drp] via OTIC
  Filled 2012-12-13: qty 5

## 2012-12-13 MED ORDER — SODIUM CHLORIDE 0.9 % IV SOLN
INTRAVENOUS | Status: DC
  Administered 2012-12-13: 22:00:00 via INTRAVENOUS

## 2012-12-13 MED ORDER — ASPIRIN EC 81 MG PO TBEC
81.0000 mg | DELAYED_RELEASE_TABLET | Freq: Every day | ORAL | Status: DC
  Administered 2012-12-14: 81 mg via ORAL
  Filled 2012-12-13 (×3): qty 1

## 2012-12-13 MED ORDER — HYDRALAZINE HCL 20 MG/ML IJ SOLN
10.0000 mg | Freq: Four times a day (QID) | INTRAMUSCULAR | Status: DC | PRN
  Filled 2012-12-13: qty 1

## 2012-12-13 MED ORDER — NEBIVOLOL HCL 5 MG PO TABS
5.0000 mg | ORAL_TABLET | Freq: Every day | ORAL | Status: DC
  Administered 2012-12-14 – 2012-12-15 (×2): 5 mg via ORAL
  Filled 2012-12-13 (×4): qty 1

## 2012-12-13 MED ORDER — SODIUM CHLORIDE 0.9 % IV SOLN: 250.0000 mL | INTRAVENOUS | Status: DC | PRN

## 2012-12-13 MED ORDER — SODIUM CHLORIDE 0.9 % IJ SOLN
3.0000 mL | Freq: Two times a day (BID) | INTRAMUSCULAR | Status: DC
  Administered 2012-12-14: 3 mL via INTRAVENOUS

## 2012-12-13 NOTE — Telephone Encounter (Signed)
Having procedure tomorrow-question about how to take his medicine.

## 2012-12-13 NOTE — Telephone Encounter (Signed)
Returned call.  Pt stated he has a procedure in the morning.  Pt wanted to know if he can eat or drink anything tonight and about his medicines.  Reviewed letter for procedure.  Pt informed he is NOT to eat or drink anything after midnight and can take his meds w/ a sip of water.  Pt stated he is not diabetic or taking any diabetic meds.  Stated he does have a note NOT to take Tribenzor the day of the surgery and advised to comply.  Stated that was written in on the letter.  Pt advised to follow instruction on letter he has.  Pt verbalized understanding and agreed w/ plan.

## 2012-12-13 NOTE — Progress Notes (Signed)
    Subjective: The patient currently denies nausea, vomiting, fever, chest pain, shortness of breath, orthopnea, dizziness, PND, cough, congestion, abdominal pain, hematochezia, melena, lower extremity edema.   Objective: Vital signs in last 24 hours: Temp:  [98.3 F (36.8 C)] 98.3 F (36.8 C) (08/04 1817) Pulse Rate:  [71] 71 (08/04 1817) Resp:  [18] 18 (08/04 1817) BP: (187)/(84) 187/84 mmHg (08/04 1817) SpO2:  [100 %] 100 % (08/04 1817) Weight:  [255 lb 8 oz (115.894 kg)] 255 lb 8 oz (115.894 kg) (08/04 1817)    Intake/Output from previous day:   Intake/Output this shift:    Medications Current Facility-Administered Medications  Medication Dose Route Frequency Provider Last Rate Last Dose  . clopidogrel (PLAVIX) tablet 75 mg  75 mg Oral QHS Tarri Fuller, PA-C      . hydrALAZINE (APRESOLINE) injection 10 mg  10 mg Intravenous Q6H PRN Tarri Fuller, PA-C        PE: General appearance: alert, cooperative and no distress Lungs: mild rhonchi left side, cleared with cough. Heart: regular rate and rhythm, S1, S2 normal, no murmur, click, rub or gallop Extremities: No LEE Pulses: 2+ and symmetric Skin: Warma nad dry Neurologic: Grossly normal     Assessment/Plan  Active Problems:   Hyperlipidemia   Essential hypertension   Renal artery stenosis  Plan:  Patient admitted for hydration-138ml/hr NS.  Renal angiogram and possible intervention tomorrow.  Will add IV hydralazine, 10mg  Q6hr PRN for elevated BP.  NPO after MN.    LOS: 0 days    Steven Scott 12/13/2012 6:34 PM

## 2012-12-14 ENCOUNTER — Encounter (HOSPITAL_COMMUNITY)
Admission: RE | Disposition: A | Discharge status: Home / Self Care | Payer: Self-pay | Source: Ambulatory Visit | Attending: Cardiovascular Disease

## 2012-12-14 ENCOUNTER — Other Ambulatory Visit: Payer: Self-pay

## 2012-12-14 DIAGNOSIS — I2510 Atherosclerotic heart disease of native coronary artery without angina pectoris: Secondary | ICD-10-CM

## 2012-12-14 DIAGNOSIS — I701 Atherosclerosis of renal artery: Secondary | ICD-10-CM

## 2012-12-14 DIAGNOSIS — I10 Essential (primary) hypertension: Secondary | ICD-10-CM | POA: Diagnosis not present

## 2012-12-14 DIAGNOSIS — E785 Hyperlipidemia, unspecified: Secondary | ICD-10-CM | POA: Diagnosis not present

## 2012-12-14 DIAGNOSIS — E663 Overweight: Secondary | ICD-10-CM | POA: Diagnosis not present

## 2012-12-14 HISTORY — PX: RENAL ANGIOGRAM: SHX5509

## 2012-12-14 HISTORY — PX: PERCUTANEOUS STENT INTERVENTION: SHX5500

## 2012-12-14 LAB — CBC
HCT: 38.9 % — ABNORMAL LOW (ref 39.0–52.0)
Platelets: 188 10*3/uL (ref 150–400)
RBC: 4.44 MIL/uL (ref 4.22–5.81)
RDW: 14 % (ref 11.5–15.5)
WBC: 9.2 10*3/uL (ref 4.0–10.5)

## 2012-12-14 LAB — BASIC METABOLIC PANEL
Chloride: 105 mEq/L (ref 96–112)
Creatinine, Ser: 1.73 mg/dL — ABNORMAL HIGH (ref 0.50–1.35)
GFR calc Af Amer: 45 mL/min — ABNORMAL LOW (ref >90)
Sodium: 140 mEq/L (ref 135–145)

## 2012-12-14 LAB — POCT ACTIVATED CLOTTING TIME
Activated Clotting Time: 211 seconds
Activated Clotting Time: 252 seconds

## 2012-12-14 SURGERY — RENAL ANGIOGRAM
Anesthesia: LOCAL

## 2012-12-14 MED ORDER — CLOPIDOGREL BISULFATE 75 MG PO TABS: 75.0000 mg | ORAL_TABLET | Freq: Every day | ORAL | Status: DC

## 2012-12-14 MED ORDER — SODIUM CHLORIDE 0.9 % IV SOLN: INTRAVENOUS | Status: AC

## 2012-12-14 MED ORDER — ASPIRIN EC 325 MG PO TBEC
325.0000 mg | DELAYED_RELEASE_TABLET | Freq: Every day | ORAL | Status: DC
  Administered 2012-12-15: 09:00:00 325 mg via ORAL
  Filled 2012-12-14: qty 1

## 2012-12-14 MED ORDER — ONDANSETRON HCL 4 MG/2ML IJ SOLN: 4.0000 mg | Freq: Four times a day (QID) | INTRAMUSCULAR | Status: DC | PRN

## 2012-12-14 MED ORDER — ONDANSETRON HCL 4 MG/2ML IJ SOLN
4.0000 mg | Freq: Four times a day (QID) | INTRAMUSCULAR | Status: DC | PRN
  Filled 2012-12-14: qty 2

## 2012-12-14 MED ORDER — HYDRALAZINE HCL 20 MG/ML IJ SOLN
10.0000 mg | INTRAMUSCULAR | Status: DC
  Administered 2012-12-14 (×2): via INTRAVENOUS
  Filled 2012-12-14: qty 1

## 2012-12-14 MED ORDER — LIDOCAINE HCL (PF) 1 % IJ SOLN
INTRAMUSCULAR | Status: AC
  Filled 2012-12-14: qty 30

## 2012-12-14 MED ORDER — MIDAZOLAM HCL 2 MG/2ML IJ SOLN
INTRAMUSCULAR | Status: AC
  Filled 2012-12-14: qty 2

## 2012-12-14 MED ORDER — ACETAMINOPHEN 325 MG PO TABS
650.0000 mg | ORAL_TABLET | ORAL | Status: DC | PRN
  Administered 2012-12-14: 650 mg via ORAL
  Filled 2012-12-14: qty 2

## 2012-12-14 MED ORDER — HEPARIN SODIUM (PORCINE) 1000 UNIT/ML IJ SOLN
INTRAMUSCULAR | Status: AC
  Filled 2012-12-14: qty 1

## 2012-12-14 MED ORDER — FENTANYL CITRATE 0.05 MG/ML IJ SOLN
INTRAMUSCULAR | Status: AC
  Filled 2012-12-14: qty 2

## 2012-12-14 NOTE — H&P (Signed)
   Pt was reexamined and existing H & P reviewed. No changes found.  [name], MD [facility] 12/14/2012 1:25 PM

## 2012-12-14 NOTE — CV Procedure (Signed)
Steven Scott is a 67 y.o. male    SN:[id number] LOCATION:  FACILITY: [facility]  PHYSICIAN: Quay Burow, M.D. [birth year]   DATE OF PROCEDURE:  12/14/2012  DATE OF DISCHARGE:   CARDIAC CATHETERIZATION     History obtained from chart review.The patient is a very pleasant 67 year old, moderately overweight, married Caucasian male, father of 2 who I last saw in the office 6 months ago. He has a history of persistent hypertension status post bilateral renal artery PTA and stenting with a positive Myoview, as well as heart catheterization that showed a high-grade distal RCA stenosis. I stented both his renal arteries successfully but was unable to cross his distal RCA, which Dr. Claiborne Billings subsequently performed successfully on March 28, 2008, with a Xience V drug-eluting stent with an excellent result. He clinically improved. He had an excellent lipid profile. Renal Dopplers performed January 31, 2011, showed progression of disease in both renal arteries suggesting high "in-stent restenosis." I performed angiography on him October 18 revealing 90% right and 70% left in-stent restenosis with 60 and 40-mm gradients, respectively. He underwent AngioSculpt cutting balloon atherectomy of both renal arteries with an excellent angiographic and followup Doppler result. His blood pressure has remained stable. He is otherwise asymptomatic. He was changed to generic antihypertensive medications off of Tribenzor and apparently his blood pressures have been more difficult to control since that time. He does admit to dietary indiscretion with regards to salt as well.  I saw Mr. Allman 04/19/12. He denies chest pain or shortness of breath. Apparently his creatinine has slowly increased now to close to 2. Recent renal Dopplers performed in our office 11/19/12 suggest rapid progression of the in-stent restenosis and throat within the left renal artery stent with a renal/aortic ratio of 7.33. Based on this, I will arrange for  him to be admitted the day before his upcoming procedure for hydration. We'll hold his TriBenzor and attempt revascularization of his left renal artery.    PROCEDURE DESCRIPTION:    The patient was brought to the second floor [facility] cath lab in the postabsorptive state. He was premedicated with Valium 5 mg by mouth, IV Versed and fentanyl. His right groinwas prepped and shaved in usual sterile fashion. Xylocaine 1% was used for local anesthesia. A 5 French sheath was inserted into the right common femoral  artery using standard Seldinger technique. The patient received 5000 units  of heparin  intravenously.  The ending ACT was 211. Total of 54 cc of contrast was administered to the patient.    HEMODYNAMICS:    AO SYSTOLIC/AO DIASTOLIC: 123XX123   Left renal artery: 100/78  ANGIOGRAPHIC RESULTS:   1. Right renal artery-60-70% ostial  2: Left renal artery-80-90% mid in-stent restenosis  IMPRESSION:bilateral renal artery in-stent restenosis by angiography with a normal left renal artery duplex, worsening renal function and difficult to control hypertension. We'll proceed with PTA and stenting using an iCast  covered stent.  Procedure description: The 5 French sheath was exchanged for a 6 [place] [facility] [place] 45 cm sheath. I was able to get across the left renal artery stenosis with a 5 [place] JR 4 catheter and a [place] core wire. I then performed PTCA with a 5 mm x 20 mm long angiosculpt  balloon at nominal pressures with an obvious waist that was relieved at high pressure. Following this I removed the diagnostic right Judkins catheter and placed a long 5 Rosen wire across the lesion into the distal main renal artery. I withdrew the  [place] wire and carefully placed a 5 mm x 16 mm long iCast  covered stent across the previously stented segment and deployed at 12-14 atmospheres. A postoperative 6 mm x 2 cm balloon up to 16 atmospheres resulting reduction of an 80-90% in-stent restenosis  and to 0% residual. The patient tolerated the procedure well. The guidewire was removed. The long sheath was exchanged for a short 6 [place] sheath. The patient left the lab in stable condition.   Overall impression: Successful left renal artery PTA and presenting with an iCast  covered stent for physiologically significant left renal artery in-stent restenosis in the setting of worsening renal function and difficult to control hypertension. Patient will be hydrated overnight and discharged home in the morning on aspirin Plavix. Will repeat his renal Doppler studies efforts you have any.  [name] MD, [facility] 12/14/2012 2:32 PM

## 2012-12-14 NOTE — Progress Notes (Signed)
    Subjective: No complaints.  Objective: Vital signs in last 24 hours: Temp:  [97.8 F (36.6 C)-98.3 F (36.8 C)] 98.1 F (36.7 C) (08/05 0502) Pulse Rate:  [61-71] 61 (08/05 0502) Resp:  [18] 18 (08/05 0502) BP: (149-187)/(60-84) 170/61 mmHg (08/05 0502) SpO2:  [98 %-100 %] 99 % (08/05 0502) Weight:  [252 lb 6.4 oz (114.488 kg)-255 lb 8 oz (115.894 kg)] 252 lb 6.4 oz (114.488 kg) (08/05 0502)    Intake/Output from previous day: 08/04 0701 - 08/05 0700 In: 3 [I.V.:3] Out: 400 [Urine:400] Intake/Output this shift:    Medications Current Facility-Administered Medications  Medication Dose Route Frequency Provider Last Rate Last Dose  . 0.9 %  sodium chloride infusion  250 mL Intravenous PRN Tarri Fuller, PA-C      . 0.9 %  sodium chloride infusion   Intravenous Continuous Tarri Fuller, PA-C 100 mL/hr at 12/13/12 2143    . aspirin EC tablet 81 mg  81 mg Oral Q breakfast Tarri Fuller, PA-C      . clopidogrel (PLAVIX) tablet 75 mg  75 mg Oral QHS Tarri Fuller, PA-C   75 mg at 12/13/12 2039  . hydrALAZINE (APRESOLINE) injection 10 mg  10 mg Intravenous Q6H PRN Tarri Fuller, PA-C      . nebivolol (BYSTOLIC) tablet 5 mg  5 mg Oral Q breakfast Tarri Fuller, PA-C      . nitroGLYCERIN (NITROSTAT) SL tablet 0.4 mg  0.4 mg Sublingual Q5 min PRN Tarri Fuller, PA-C      . ofloxacin (FLOXIN) 0.3 % otic solution 5 drop  5 drop Right Ear Daily [name], MD      . simvastatin (ZOCOR) tablet 20 mg  20 mg Oral QHS Tarri Fuller, PA-C   20 mg at 12/13/12 2040  . sodium chloride 0.9 % injection 3 mL  3 mL Intravenous Q12H Tarri Fuller, PA-C      . sodium chloride 0.9 % injection 3 mL  3 mL Intravenous Q12H Tarri Fuller, PA-C   3 mL at 12/13/12 2144  . sodium chloride 0.9 % injection 3 mL  3 mL Intravenous PRN Tarri Fuller, PA-C        PE: General appearance: alert, cooperative and no distress  Lungs: mild rhonchi left side, cleared with cough.  Heart: regular rate and rhythm, S1, S2 normal, no  murmur, click, rub or gallop  Extremities: No LEE  Pulses: 2+ and symmetric  Skin: Warma nad dry  Neurologic: Grossly normal   Lab Results:   Recent Labs  12/14/12 0600  WBC 9.2  HGB 13.3  HCT 38.9*  PLT 188   BMET  Recent Labs  12/14/12 0600  NA 140  K 3.9  CL 105  CO2 23  GLUCOSE 96  BUN 34*  CREATININE 1.73*  CALCIUM 8.9    Assessment/Plan   Active Problems:   Hyperlipidemia   Essential hypertension   Renal artery stenosis  Plan:  Renal angiogram today with possible intervention.  BP elevated. Hydralazine ordered.  Hydrated overnight with 161ml/hr NS.  SCr stable.     LOS: 1 day    HAGER, BRYAN 12/14/2012 7:52 AM   Agree with note written by Luisa Dago [facility]  Renal fxn stable. For Renal angio +/- re intervention today. Admitted last night for hydration because of CRI.   [name] 12/14/2012 7:59 AM

## 2012-12-15 ENCOUNTER — Other Ambulatory Visit: Payer: Self-pay | Admitting: Physician Assistant

## 2012-12-15 ENCOUNTER — Telehealth (HOSPITAL_COMMUNITY): Payer: Self-pay | Admitting: Physician Assistant

## 2012-12-15 DIAGNOSIS — I701 Atherosclerosis of renal artery: Secondary | ICD-10-CM | POA: Diagnosis not present

## 2012-12-15 DIAGNOSIS — I10 Essential (primary) hypertension: Secondary | ICD-10-CM | POA: Diagnosis not present

## 2012-12-15 DIAGNOSIS — I2510 Atherosclerotic heart disease of native coronary artery without angina pectoris: Secondary | ICD-10-CM | POA: Diagnosis not present

## 2012-12-15 LAB — BASIC METABOLIC PANEL
BUN: 29 mg/dL — ABNORMAL HIGH (ref 6–23)
Chloride: 107 mEq/L (ref 96–112)
GFR calc Af Amer: 49 mL/min — ABNORMAL LOW (ref >90)
Potassium: 4.4 mEq/L (ref 3.5–5.1)

## 2012-12-15 LAB — CBC
HCT: 37.9 % — ABNORMAL LOW (ref 39.0–52.0)
Hemoglobin: 13 g/dL (ref 13.0–17.0)
WBC: 9.9 10*3/uL (ref 4.0–10.5)

## 2012-12-15 MED ORDER — ASPIRIN 325 MG PO TBEC: 325.0000 mg | DELAYED_RELEASE_TABLET | Freq: Every day | ORAL | Status: DC

## 2012-12-15 NOTE — Progress Notes (Signed)
Subjective:  No CP/SOB or groin pain.  Objective:  Temp:  [97.4 F (36.3 C)-98.3 F (36.8 C)] 97.7 F (36.5 C) (08/06 0821) Pulse Rate:  [66-103] 67 (08/06 0821) Resp:  [15-19] 18 (08/06 0821) BP: (105-190)/(49-98) 169/55 mmHg (08/06 0821) SpO2:  [94 %-99 %] 98 % (08/06 0821) Weight:  [253 lb 8.5 oz (115 kg)] 253 lb 8.5 oz (115 kg) (08/06 0019) Weight change: -1 lb 15.5 oz (-0.894 kg)  Intake/Output from previous day: 08/05 0701 - 08/06 0700 In: 1721.3 [P.O.:840; I.V.:881.3] Out: 600 [Urine:600]  Intake/Output from this shift:    Physical Exam: General appearance: alert and no distress Neck: no adenopathy, no carotid bruit, no JVD, supple, symmetrical, trachea midline and thyroid not enlarged, symmetric, no tenderness/mass/nodules Lungs: clear to auscultation bilaterally Heart: regular rate and rhythm, S1, S2 normal, no murmur, click, rub or gallop Extremities: extremities normal, atraumatic, no cyanosis or edema and Right groin OK  Lab Results: Results for orders placed during the hospital encounter of 12/13/12 (from the past 48 hour(s))  BASIC METABOLIC PANEL     Status: Abnormal   Collection Time    12/14/12  6:00 AM      Result Value Range   Sodium 140  135 - 145 mEq/L   Potassium 3.9  3.5 - 5.1 mEq/L   Chloride 105  96 - 112 mEq/L   CO2 23  19 - 32 mEq/L   Glucose, Bld 96  70 - 99 mg/dL   BUN 34 (*) 6 - 23 mg/dL   Creatinine, Ser 1.73 (*) 0.50 - 1.35 mg/dL   Calcium 8.9  8.4 - 10.5 mg/dL   GFR calc non Af Amer 39 (*) >90 mL/min   GFR calc Af Amer 45 (*) >90 mL/min   Comment:            The eGFR has been calculated     using the CKD EPI equation.     This calculation has not been     validated in all clinical     situations.     eGFR's persistently     <90 mL/min signify     possible Chronic Kidney Disease.  CBC     Status: Abnormal   Collection Time    12/14/12  6:00 AM      Result Value Range   WBC 9.2  4.0 - 10.5 K/uL   RBC 4.44  4.22 - 5.81  MIL/uL   Hemoglobin 13.3  13.0 - 17.0 g/dL   HCT 38.9 (*) 39.0 - 52.0 %   MCV 87.6  78.0 - 100.0 fL   MCH 30.0  26.0 - 34.0 pg   MCHC 34.2  30.0 - 36.0 g/dL   RDW 14.0  11.5 - 15.5 %   Platelets 188  150 - 400 K/uL  POCT ACTIVATED CLOTTING TIME     Status: None   Collection Time    12/14/12  1:50 PM      Result Value Range   Activated Clotting Time 252    POCT ACTIVATED CLOTTING TIME     Status: None   Collection Time    12/14/12  2:16 PM      Result Value Range   Activated Clotting Time 211    POCT ACTIVATED CLOTTING TIME     Status: None   Collection Time    12/14/12  4:06 PM      Result Value Range   Activated Clotting Time [id number]    BASIC METABOLIC  PANEL     Status: Abnormal   Collection Time    12/15/12  4:34 AM      Result Value Range   Sodium 140  135 - 145 mEq/L   Potassium 4.4  3.5 - 5.1 mEq/L   Chloride 107  96 - 112 mEq/L   CO2 23  19 - 32 mEq/L   Glucose, Bld 89  70 - 99 mg/dL   BUN 29 (*) 6 - 23 mg/dL   Creatinine, Ser 1.61 (*) 0.50 - 1.35 mg/dL   Calcium 8.9  8.4 - 10.5 mg/dL   GFR calc non Af Amer 42 (*) >90 mL/min   GFR calc Af Amer 49 (*) >90 mL/min   Comment:            The eGFR has been calculated     using the CKD EPI equation.     This calculation has not been     validated in all clinical     situations.     eGFR's persistently     <90 mL/min signify     possible Chronic Kidney Disease.  CBC     Status: Abnormal   Collection Time    12/15/12  4:34 AM      Result Value Range   WBC 9.9  4.0 - 10.5 K/uL   RBC 4.33  4.22 - 5.81 MIL/uL   Hemoglobin 13.0  13.0 - 17.0 g/dL   HCT 37.9 (*) 39.0 - 52.0 %   MCV 87.5  78.0 - 100.0 fL   MCH 30.0  26.0 - 34.0 pg   MCHC 34.3  30.0 - 36.0 g/dL   RDW 14.1  11.5 - 15.5 %   Platelets 197  150 - 400 K/uL    Imaging: Imaging results have been reviewed  Assessment/Plan:   1. Active Problems: 2.   Hyperlipidemia 3.   Essential hypertension 4.   Renal artery stenosis 5.   Time Spent Directly with  Patient:  20 minutes  Length of Stay:  LOS: 2 days   S/P Left renal re PTA and stenting for ISR using an iCast covered stent. Exam benign, labs OK (Scr actually better), BP still borderline. OK for D/C home. Needs renal dopplers then ROV with me.  [name] 12/15/2012, 8:39 AM

## 2012-12-15 NOTE — Discharge Summary (Signed)
Physician Discharge Summary  Patient ID: Steven Scott MRN: [id number] DOB/AGE: October 11, 1945 67 y.o.  Admit date: 12/13/2012 Discharge date: 12/15/2012  Admission Diagnoses:  Renal artery stenosis  Discharge Diagnoses:  Active Problems:   Hyperlipidemia   Essential hypertension   Renal artery stenosis   Discharged Condition: stable  Hospital Course:   The patient is a very pleasant 67 year old, moderately overweight, married Caucasian male, father of 2 who was last seen in the office 68 months ago by Dr. Gwenlyn Found. He has a history of persistent hypertension status post bilateral renal artery PTA and stenting with a positive Myoview, as well as heart catheterization that showed a high-grade distal RCA stenosis. Dr. Gwenlyn Found stented both his renal arteries successfully but was unable to cross his distal RCA, which Dr. Claiborne Billings subsequently performed successfully on March 28, 2008, with a Xience V drug-eluting stent with an excellent result. He clinically improved. He had an excellent lipid profile. Renal Dopplers performed January 31, 2011, showed progression of disease in both renal arteries suggesting high "in-stent restenosis." Dr. Gwenlyn Found performed angiography on him October 18 revealing 90% right and 70% left in-stent restenosis with 60 and 40-mm gradients, respectively. He underwent AngioSculpt cutting balloon atherectomy of both renal arteries with an excellent angiographic and followup Doppler result. His blood pressure has remained stable. He is otherwise asymptomatic. He was changed to generic antihypertensive medications off of Tribenzor and apparently his blood pressures have been more difficult to control since that time. He does admit to dietary indiscretion with regards to salt as well.  He denies chest pain or shortness of breath. Apparently his creatinine has slowly increased now to close to 2. Recent renal Dopplers performed in our office 11/19/12 suggest rapid progression of the in-stent  restenosis and throat within the left renal artery stent with a renal/aortic ratio of 7.33.     The patient was admitted for attempt at revascularization of his left renal artery.  TriBenzor was held prior.  He was hydrated with NS the night before and then underwent successful left renal artery PTA and presenting with an iCast covered stent for physiologically significant left renal artery in-stent restenosis in the setting of worsening renal function and difficult to control hypertension.  He will have folllow up renal dopplers in about two weeks and then see Dr. Gwenlyn Found.  The patient was seen by Dr. Gwenlyn Found who felt he was stable for DC home.   Consults: None  Significant Diagnostic Studies:  PROCEDURE DESCRIPTION:  The patient was brought to the second floor  Cardiac cath lab in the postabsorptive state. He was premedicated with Valium 5 mg by mouth, IV Versed and fentanyl. His right groinwas prepped and shaved in usual sterile fashion. Xylocaine 1% was used for local anesthesia. A 5 French sheath was inserted into the right common femoral  artery using standard Seldinger technique. The patient received 5000 units of heparin intravenously. The ending ACT was 211. Total of 54 cc of contrast was administered to the patient.  HEMODYNAMICS:  AO SYSTOLIC/AO DIASTOLIC: 123XX123  Left renal artery: 100/78  ANGIOGRAPHIC RESULTS:  1. Right renal artery-60-70% ostial  2: Left renal artery-80-90% mid in-stent restenosis  IMPRESSION:bilateral renal artery in-stent restenosis by angiography with a normal left renal artery duplex, worsening renal function and difficult to control hypertension. We'll proceed with PTA and stenting using an iCast covered stent.  Procedure description: The 5 French sheath was exchanged for a 6 [place] [facility] [place] 45 cm sheath. I was able to get  across the left renal artery stenosis with a 5 [place] JR 4 catheter and a [place] core wire. I then performed PTCA with a 5 mm x 20 mm  long angiosculpt balloon at nominal pressures with an obvious waist that was relieved at high pressure. Following this I removed the diagnostic right Judkins catheter and placed a long 5 Rosen wire across the lesion into the distal main renal artery. I withdrew the [place] wire and carefully placed a 5 mm x 16 mm long iCast covered stent across the previously stented segment and deployed at 12-14 atmospheres. A postoperative 6 mm x 2 cm balloon up to 16 atmospheres resulting reduction of an 80-90% in-stent restenosis and to 0% residual. The patient tolerated the procedure well. The guidewire was removed. The long sheath was exchanged for a short 6 [place] sheath. The patient left the lab in stable condition.  Overall impression: Successful left renal artery PTA and presenting with an iCast covered stent for physiologically significant left renal artery in-stent restenosis in the setting of worsening renal function and difficult to control hypertension. Patient will be hydrated overnight and discharged home in the morning on aspirin Plavix. Will repeat his renal Doppler studies efforts you have any.  [name] MD, [facility]  12/14/2012   Treatments: See above  Discharge Exam: Blood pressure 169/55, pulse 67, temperature 97.7 F (36.5 C), temperature source Oral, resp. rate 18, height 5\' 11"  (1.803 m), weight 253 lb 8.5 oz (115 kg), SpO2 98.00%.   Disposition: 01-Home or Self Care      Discharge Orders   Future Orders Complete By Expires     Diet - low sodium heart healthy  As directed     Discharge instructions  As directed     Comments:      No lifting more than a half gallon of milk or driving for three days.    Increase activity slowly  As directed         Medication List         aspirin 325 MG EC tablet  Take 1 tablet (325 mg total) by mouth daily.     clobetasol cream 0.05 %  Commonly known as:  TEMOVATE  Apply 1 application topically daily as needed (Psoriasis).      clopidogrel 75 MG tablet  Commonly known as:  PLAVIX  Take 75 mg by mouth at bedtime.     nebivolol 5 MG tablet  Commonly known as:  BYSTOLIC  Take 5 mg by mouth daily with breakfast.     nitroGLYCERIN 0.4 MG SL tablet  Commonly known as:  NITROSTAT  Place 0.4 mg under the tongue every 5 (five) minutes as needed for chest pain.     ofloxacin 0.3 % otic solution  Commonly known as:  FLOXIN  Place 10 drops into the right ear daily.     simvastatin 20 MG tablet  Commonly known as:  ZOCOR  Take 20 mg by mouth at bedtime.     TRIBENZOR 40-10-25 MG Tabs  Generic drug:  Olmesartan-Amlodipine-HCTZ  Take 1 tablet by mouth daily with breakfast.       Greater than 27mins was spent completing Discharge.  SignedTarri Fuller 12/15/2012, 9:01 AM

## 2012-12-22 ENCOUNTER — Ambulatory Visit (HOSPITAL_COMMUNITY)
Admission: RE | Admit: 2012-12-22 | Discharge: 2012-12-22 | Disposition: A | Discharge status: Home / Self Care | Payer: Medicare Other | Source: Ambulatory Visit | Attending: Physician Assistant | Admitting: Physician Assistant

## 2012-12-22 DIAGNOSIS — I701 Atherosclerosis of renal artery: Secondary | ICD-10-CM | POA: Insufficient documentation

## 2012-12-22 NOTE — Progress Notes (Signed)
Renal Duplex Limited completed status post LRA stent. Oda Cogan, BS, RDMS, RVT

## 2012-12-23 IMAGING — CR DG CHEST 2V
2 series · 2 of 2 positions shown · non-contrast
Comparison: 02/27/2009

CLINICAL DATA: Preop renal angiogram

CHEST - 2 VIEW

[view not recorded (1 of 2)]
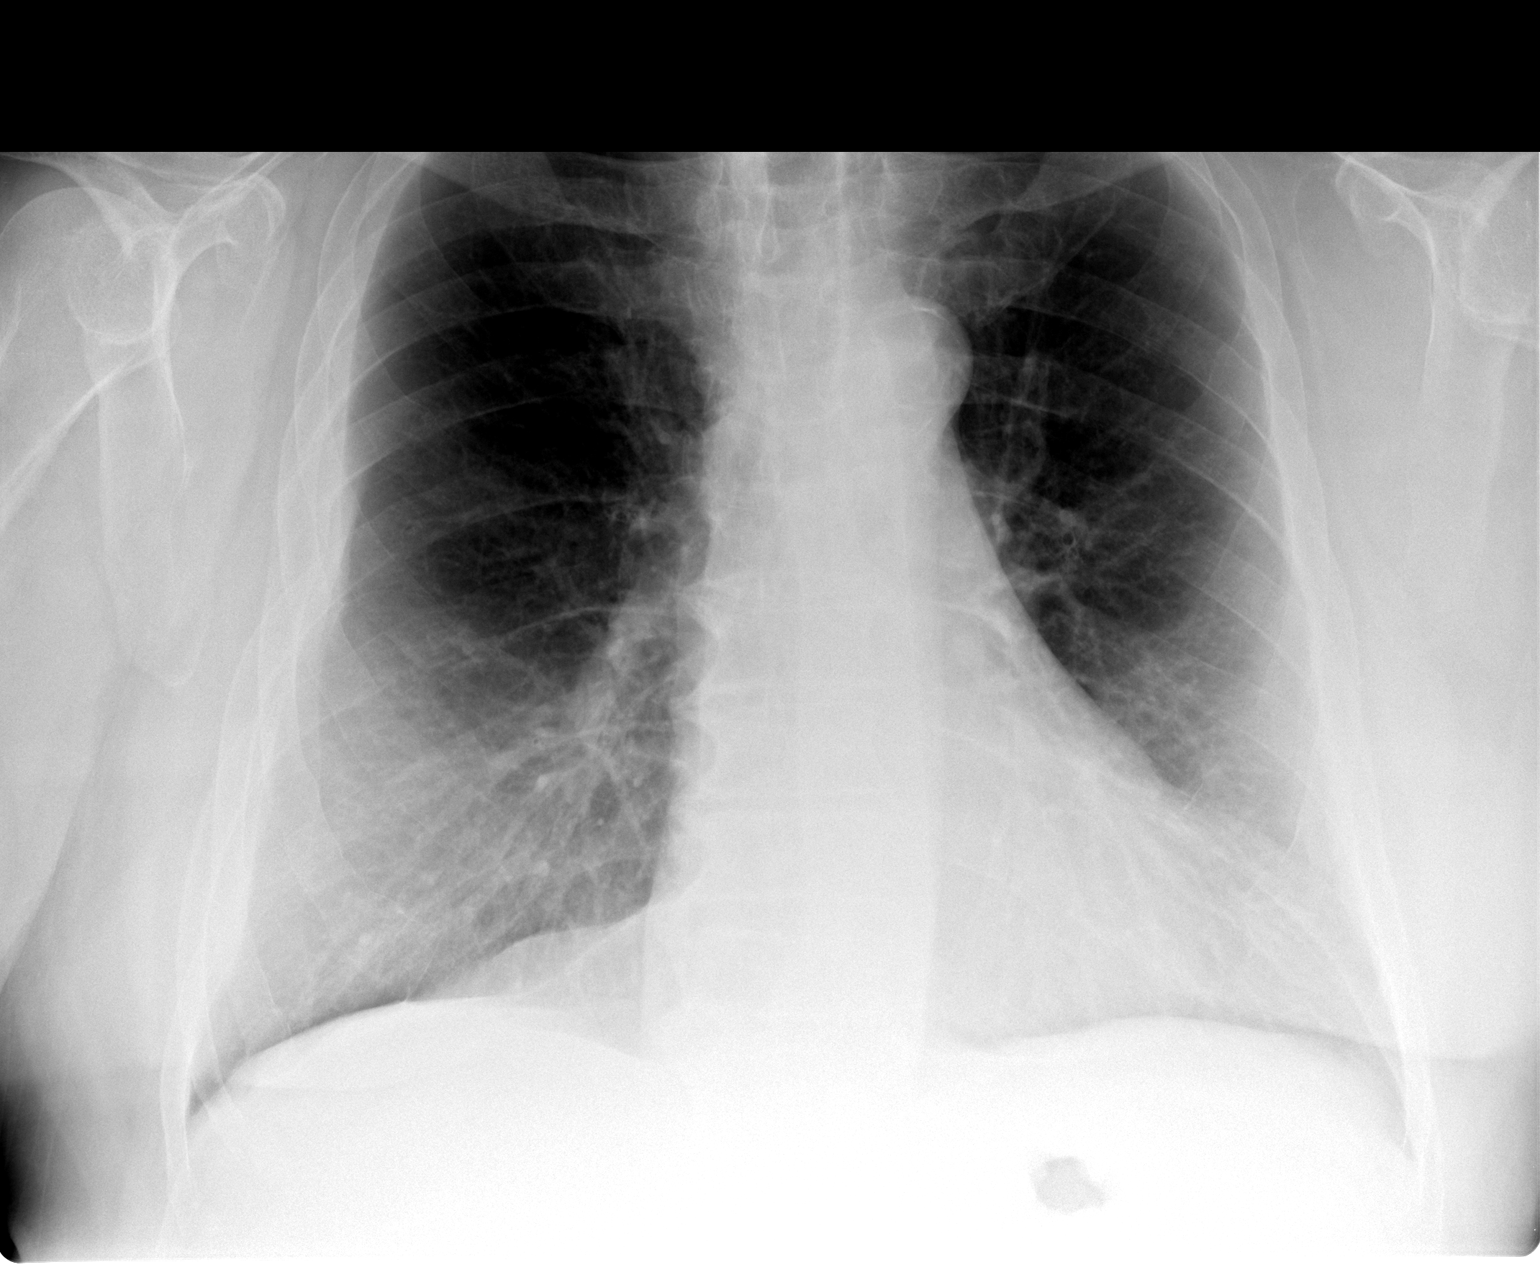

[view not recorded (2 of 2)]
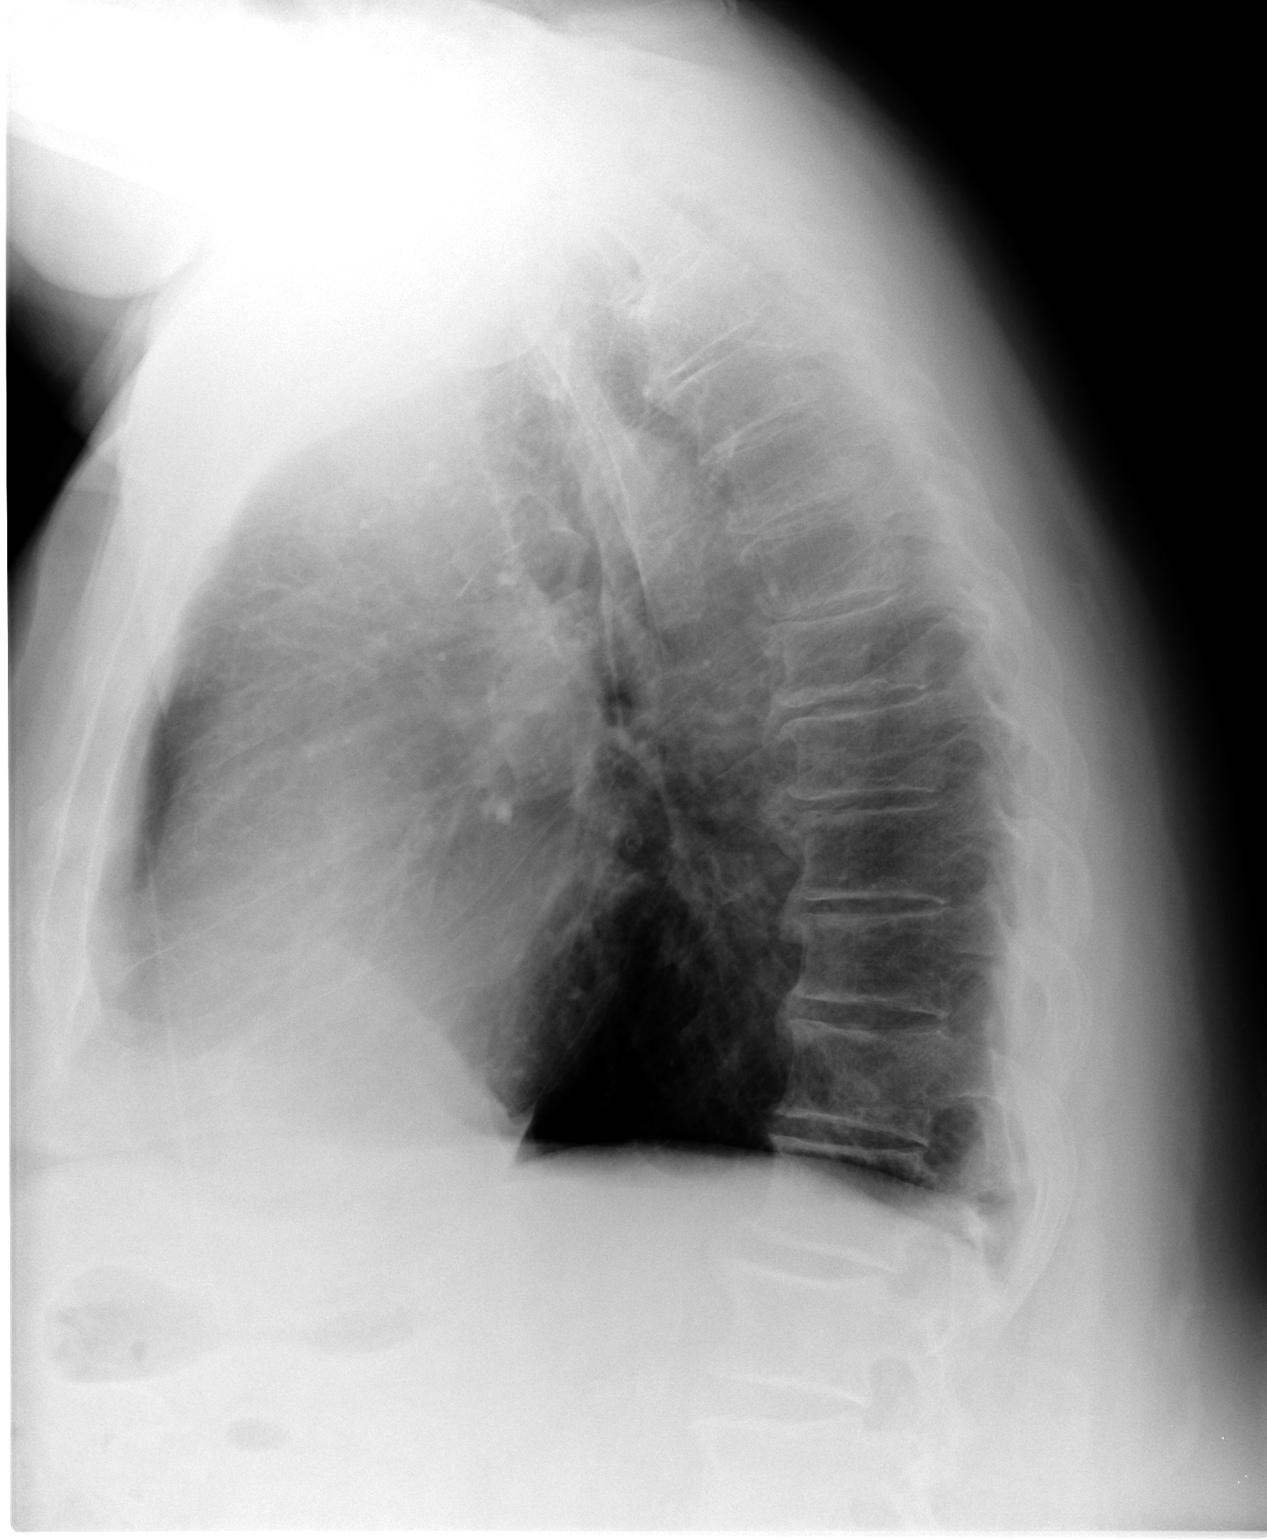

[2 of 2 positions shown; findings below may reference images not displayed]

FINDINGS: COPD with hyperinflation of the lungs.  The heart size
upper normal.  Negative for heart failure.  Negative for pneumonia
or mass lesion.  Lungs are clear.
IMPRESSION: COPD.  No active cardiopulmonary disease.

## 2012-12-27 ENCOUNTER — Other Ambulatory Visit: Payer: Self-pay | Admitting: *Deleted

## 2012-12-27 MED ORDER — OLMESARTAN-AMLODIPINE-HCTZ 40-10-25 MG PO TABS: 1.0000 | ORAL_TABLET | Freq: Every day | ORAL | Status: DC

## 2012-12-27 NOTE — Telephone Encounter (Signed)
Rx was sent to pharmacy electronically. 

## 2012-12-30 ENCOUNTER — Telehealth: Payer: Self-pay | Admitting: *Deleted

## 2012-12-30 ENCOUNTER — Encounter: Payer: Self-pay | Admitting: *Deleted

## 2012-12-30 DIAGNOSIS — I701 Atherosclerosis of renal artery: Secondary | ICD-10-CM

## 2012-12-30 NOTE — Telephone Encounter (Signed)
Message copied by Chauncy Lean on Thu Dec 30, 2012 12:24 PM ------      Message from: [name]      Created: Tue Dec 28, 2012  8:24 PM       Improved velocities Left renal artery s/p stent. Repeat 6 months ------

## 2013-01-12 DIAGNOSIS — I10 Essential (primary) hypertension: Secondary | ICD-10-CM | POA: Diagnosis not present

## 2013-01-12 DIAGNOSIS — Z79899 Other long term (current) drug therapy: Secondary | ICD-10-CM | POA: Diagnosis not present

## 2013-01-12 DIAGNOSIS — D649 Anemia, unspecified: Secondary | ICD-10-CM | POA: Diagnosis not present

## 2013-01-12 DIAGNOSIS — R809 Proteinuria, unspecified: Secondary | ICD-10-CM | POA: Diagnosis not present

## 2013-01-12 DIAGNOSIS — E559 Vitamin D deficiency, unspecified: Secondary | ICD-10-CM | POA: Diagnosis not present

## 2013-01-19 DIAGNOSIS — D649 Anemia, unspecified: Secondary | ICD-10-CM | POA: Diagnosis not present

## 2013-01-19 DIAGNOSIS — I509 Heart failure, unspecified: Secondary | ICD-10-CM | POA: Diagnosis not present

## 2013-01-19 DIAGNOSIS — R809 Proteinuria, unspecified: Secondary | ICD-10-CM | POA: Diagnosis not present

## 2013-01-25 DIAGNOSIS — L570 Actinic keratosis: Secondary | ICD-10-CM | POA: Diagnosis not present

## 2013-01-25 DIAGNOSIS — L408 Other psoriasis: Secondary | ICD-10-CM | POA: Diagnosis not present

## 2013-01-28 ENCOUNTER — Other Ambulatory Visit: Payer: Self-pay | Admitting: *Deleted

## 2013-01-28 MED ORDER — SIMVASTATIN 20 MG PO TABS: 20.0000 mg | ORAL_TABLET | Freq: Every day | ORAL | Status: DC

## 2013-02-01 DIAGNOSIS — Z23 Encounter for immunization: Secondary | ICD-10-CM | POA: Diagnosis not present

## 2013-02-07 ENCOUNTER — Telehealth: Payer: Self-pay | Admitting: *Deleted

## 2013-02-07 NOTE — Telephone Encounter (Signed)
Dr Gwenlyn Found reviewed the chart on Steven Scott and did give permission for asa and plavix to be held 7-10 days prior to the biopsy.  Form was signed and faxed back

## 2013-02-08 ENCOUNTER — Encounter: Payer: Self-pay | Admitting: Cardiovascular Disease

## 2013-02-15 DIAGNOSIS — Z130 Encounter for screening for diseases of the blood and blood-forming organs and certain disorders involving the immune mechanism: Secondary | ICD-10-CM | POA: Diagnosis not present

## 2013-02-15 DIAGNOSIS — R809 Proteinuria, unspecified: Secondary | ICD-10-CM | POA: Diagnosis not present

## 2013-02-15 DIAGNOSIS — N183 Chronic kidney disease, stage 3 unspecified: Secondary | ICD-10-CM | POA: Diagnosis not present

## 2013-02-17 DIAGNOSIS — I129 Hypertensive chronic kidney disease with stage 1 through stage 4 chronic kidney disease, or unspecified chronic kidney disease: Secondary | ICD-10-CM | POA: Diagnosis not present

## 2013-02-17 DIAGNOSIS — I10 Essential (primary) hypertension: Secondary | ICD-10-CM | POA: Diagnosis not present

## 2013-02-17 DIAGNOSIS — N183 Chronic kidney disease, stage 3 unspecified: Secondary | ICD-10-CM | POA: Diagnosis not present

## 2013-02-17 DIAGNOSIS — I7090 Unspecified atherosclerosis: Secondary | ICD-10-CM | POA: Diagnosis not present

## 2013-02-17 DIAGNOSIS — N189 Chronic kidney disease, unspecified: Secondary | ICD-10-CM | POA: Diagnosis not present

## 2013-02-17 DIAGNOSIS — Z130 Encounter for screening for diseases of the blood and blood-forming organs and certain disorders involving the immune mechanism: Secondary | ICD-10-CM | POA: Diagnosis not present

## 2013-02-17 DIAGNOSIS — R809 Proteinuria, unspecified: Secondary | ICD-10-CM | POA: Diagnosis not present

## 2013-02-17 DIAGNOSIS — I2510 Atherosclerotic heart disease of native coronary artery without angina pectoris: Secondary | ICD-10-CM | POA: Diagnosis not present

## 2013-02-18 DIAGNOSIS — I129 Hypertensive chronic kidney disease with stage 1 through stage 4 chronic kidney disease, or unspecified chronic kidney disease: Secondary | ICD-10-CM | POA: Diagnosis not present

## 2013-02-25 ENCOUNTER — Other Ambulatory Visit: Payer: Self-pay | Admitting: *Deleted

## 2013-02-25 MED ORDER — CLOPIDOGREL BISULFATE 75 MG PO TABS: 75.0000 mg | ORAL_TABLET | Freq: Every day | ORAL | Status: DC

## 2013-02-25 NOTE — Telephone Encounter (Signed)
Rx was sent to pharmacy electronically. 

## 2013-03-09 DIAGNOSIS — I129 Hypertensive chronic kidney disease with stage 1 through stage 4 chronic kidney disease, or unspecified chronic kidney disease: Secondary | ICD-10-CM | POA: Diagnosis not present

## 2013-03-09 DIAGNOSIS — N189 Chronic kidney disease, unspecified: Secondary | ICD-10-CM | POA: Diagnosis not present

## 2013-03-09 DIAGNOSIS — R809 Proteinuria, unspecified: Secondary | ICD-10-CM | POA: Diagnosis not present

## 2013-03-09 DIAGNOSIS — Z79899 Other long term (current) drug therapy: Secondary | ICD-10-CM | POA: Diagnosis not present

## 2013-03-09 DIAGNOSIS — D649 Anemia, unspecified: Secondary | ICD-10-CM | POA: Diagnosis not present

## 2013-03-09 DIAGNOSIS — E559 Vitamin D deficiency, unspecified: Secondary | ICD-10-CM | POA: Diagnosis not present

## 2013-03-12 DIAGNOSIS — E669 Obesity, unspecified: Secondary | ICD-10-CM | POA: Diagnosis not present

## 2013-03-12 DIAGNOSIS — I10 Essential (primary) hypertension: Secondary | ICD-10-CM | POA: Diagnosis not present

## 2013-03-12 DIAGNOSIS — R809 Proteinuria, unspecified: Secondary | ICD-10-CM | POA: Diagnosis not present

## 2013-05-03 ENCOUNTER — Encounter: Payer: Self-pay | Admitting: Cardiology

## 2013-05-03 ENCOUNTER — Ambulatory Visit (INDEPENDENT_AMBULATORY_CARE_PROVIDER_SITE_OTHER): Payer: Medicare Other | Admitting: Cardiology

## 2013-05-03 VITALS — BP 156/80 | HR 63 | Ht 71.0 in | Wt 241.0 lb

## 2013-05-03 DIAGNOSIS — I10 Essential (primary) hypertension: Secondary | ICD-10-CM | POA: Diagnosis not present

## 2013-05-03 DIAGNOSIS — N183 Chronic kidney disease, stage 3 unspecified: Secondary | ICD-10-CM | POA: Diagnosis not present

## 2013-05-03 DIAGNOSIS — I2510 Atherosclerotic heart disease of native coronary artery without angina pectoris: Secondary | ICD-10-CM | POA: Diagnosis not present

## 2013-05-03 DIAGNOSIS — E785 Hyperlipidemia, unspecified: Secondary | ICD-10-CM | POA: Diagnosis not present

## 2013-05-03 DIAGNOSIS — I701 Atherosclerosis of renal artery: Secondary | ICD-10-CM

## 2013-05-03 NOTE — Assessment & Plan Note (Addendum)
Last Scr 1.6,  Aug 2014

## 2013-05-03 NOTE — Progress Notes (Signed)
05/03/2013 Steven Scott   25-Jan-1946  [id number]  Primary Physicia Leonides Grills, MD Primary Cardiologist: Dr Gwenlyn Found  HPI:  67 y/o followed by Dr Gwenlyn Found with CAD and PVD. He recently had ISR of his Lt RA and this was stented in August. Follow up dopplers showed patent renal arteries. He was due for a 6 month follow up but he received a letter asking him to come now. He has had no problems. I believe this letter was an error. He will see Dr Judithann Sauger in [place] after his 6 month dopplers. The pt was not charged for today's visit.   Current Outpatient Prescriptions  Medication Sig Dispense Refill  . aspirin EC 325 MG EC tablet Take 1 tablet (325 mg total) by mouth daily.  30 tablet  0  . clobetasol cream (TEMOVATE) [id number] % Apply 1 application topically daily as needed (Psoriasis).      . clopidogrel (PLAVIX) 75 MG tablet Take 1 tablet (75 mg total) by mouth at bedtime.  30 tablet  10  . nebivolol (BYSTOLIC) 5 MG tablet Take 5 mg by mouth daily with breakfast.       . nitroGLYCERIN (NITROSTAT) 0.4 MG SL tablet Place 0.4 mg under the tongue every 5 (five) minutes as needed for chest pain.       Marland Kitchen ofloxacin (FLOXIN) 0.3 % otic solution Place 10 drops into the right ear daily.      . Olmesartan-Amlodipine-HCTZ (TRIBENZOR) 40-10-25 MG TABS Take 1 tablet by mouth daily with breakfast.  30 tablet  11  . simvastatin (ZOCOR) 20 MG tablet Take 1 tablet (20 mg total) by mouth at bedtime.  30 tablet  11   No current facility-administered medications for this visit.    Allergies  Allergen Reactions  . Penicillins Rash    History   Social History  . Marital Status: Married    Spouse Name: N/A    Number of Children: N/A  . Years of Education: N/A   Occupational History  . Not on file.   Social History Main Topics  . Smoking status: Former Smoker -- 2.00 packs/day for 50 years    Types: Cigarettes    Quit date: 02/24/2011  . Smokeless tobacco: Never Used  . Alcohol Use: Yes     Comment:  12/13/2012 "last alcohol was ~ 4 yr ago"  . Drug Use: No  . Sexual Activity: Not Currently   Other Topics Concern  . Not on file   Social History Narrative  . No narrative on file      Blood pressure 156/80, pulse 63, height 5\' 11"  (1.803 m), weight 241 lb (109.317 kg).  Not done    ASSESSMENT AND PLAN:   Renal artery stenosis- Bilat PTA 10/10, 10/12, and Lt RA PTA 8/14 S/P Lt RA PTA 12/14/12. Follow up dopplers 12/22/12 patent.  Essential hypertension .  Chronic renal insufficiency, stage III (moderate) Last Scr 1.6,  Aug 2014  Coronary artery disease- RCA DES 11/10 No angina  Hyperlipidemia Primary follows, last LDL 47 April 2014   PLAN  No charge. Dopplers in February then see Dr Dellia Beckwith KPA-C 05/03/2013 1:38 PM

## 2013-05-03 NOTE — Patient Instructions (Signed)
Your physician recommends that you schedule a follow-up appointment in: 2 months after your renal dopplers

## 2013-05-03 NOTE — Assessment & Plan Note (Signed)
S/P Lt RA PTA 12/14/12. Follow up dopplers 12/22/12 patent.

## 2013-05-03 NOTE — Assessment & Plan Note (Signed)
Primary follows, last LDL 47 April 2014

## 2013-05-03 NOTE — Assessment & Plan Note (Signed)
No angina 

## 2013-05-04 ENCOUNTER — Encounter: Payer: Self-pay | Admitting: Cardiology

## 2013-05-27 ENCOUNTER — Encounter (HOSPITAL_COMMUNITY): Payer: Self-pay | Admitting: *Deleted

## 2013-06-20 ENCOUNTER — Other Ambulatory Visit: Payer: Self-pay

## 2013-06-20 MED ORDER — SIMVASTATIN 20 MG PO TABS: 20.0000 mg | ORAL_TABLET | Freq: Every day | ORAL | Status: DC

## 2013-06-20 MED ORDER — CLOPIDOGREL BISULFATE 75 MG PO TABS: 75.0000 mg | ORAL_TABLET | Freq: Every day | ORAL | Status: DC

## 2013-06-20 MED ORDER — OLMESARTAN-AMLODIPINE-HCTZ 40-10-25 MG PO TABS: 1.0000 | ORAL_TABLET | Freq: Every day | ORAL | Status: DC

## 2013-06-20 NOTE — Telephone Encounter (Signed)
Rx(s) was sent to pharmacy electronically.

## 2013-07-05 ENCOUNTER — Encounter (HOSPITAL_COMMUNITY): Payer: Medicare Other

## 2013-07-12 ENCOUNTER — Ambulatory Visit (HOSPITAL_COMMUNITY)
Admission: RE | Admit: 2013-07-12 | Discharge: 2013-07-12 | Disposition: A | Discharge status: Home / Self Care | Payer: Medicare Other | Source: Ambulatory Visit | Attending: Cardiovascular Disease | Admitting: Cardiovascular Disease

## 2013-07-12 DIAGNOSIS — I701 Atherosclerosis of renal artery: Secondary | ICD-10-CM | POA: Diagnosis not present

## 2013-07-12 DIAGNOSIS — I10 Essential (primary) hypertension: Secondary | ICD-10-CM | POA: Diagnosis not present

## 2013-07-12 NOTE — Progress Notes (Signed)
Renal Duplex Completed. Steven Scott, BS, RDMS, RVT  

## 2013-07-14 DIAGNOSIS — Z79899 Other long term (current) drug therapy: Secondary | ICD-10-CM | POA: Diagnosis not present

## 2013-07-14 DIAGNOSIS — E559 Vitamin D deficiency, unspecified: Secondary | ICD-10-CM | POA: Diagnosis not present

## 2013-07-14 DIAGNOSIS — N189 Chronic kidney disease, unspecified: Secondary | ICD-10-CM | POA: Diagnosis not present

## 2013-07-14 DIAGNOSIS — D649 Anemia, unspecified: Secondary | ICD-10-CM | POA: Diagnosis not present

## 2013-07-14 DIAGNOSIS — R809 Proteinuria, unspecified: Secondary | ICD-10-CM | POA: Diagnosis not present

## 2013-07-19 ENCOUNTER — Telehealth: Payer: Self-pay | Admitting: *Deleted

## 2013-07-19 DIAGNOSIS — I701 Atherosclerosis of renal artery: Secondary | ICD-10-CM

## 2013-07-19 NOTE — Telephone Encounter (Signed)
Message copied by Chauncy Lean on Tue Jul 19, 2013  1:13 PM ------      Message from: [name]      Created: Sun Jul 17, 2013  5:58 PM       Right renal arteries velocities increased. Repeat in 6 months ------

## 2013-07-19 NOTE — Telephone Encounter (Signed)
Order placed for repeat renal dopplers in 6 months

## 2013-07-20 DIAGNOSIS — E559 Vitamin D deficiency, unspecified: Secondary | ICD-10-CM | POA: Diagnosis not present

## 2013-07-20 DIAGNOSIS — N183 Chronic kidney disease, stage 3 unspecified: Secondary | ICD-10-CM | POA: Diagnosis not present

## 2013-07-20 DIAGNOSIS — E669 Obesity, unspecified: Secondary | ICD-10-CM | POA: Diagnosis not present

## 2013-07-20 DIAGNOSIS — I10 Essential (primary) hypertension: Secondary | ICD-10-CM | POA: Diagnosis not present

## 2013-09-01 ENCOUNTER — Ambulatory Visit (INDEPENDENT_AMBULATORY_CARE_PROVIDER_SITE_OTHER): Payer: Medicare Other | Admitting: Cardiovascular Disease

## 2013-09-01 ENCOUNTER — Encounter: Payer: Self-pay | Admitting: Cardiovascular Disease

## 2013-09-01 VITALS — BP 172/90 | HR 58 | Ht 71.0 in | Wt 231.7 lb

## 2013-09-01 DIAGNOSIS — I10 Essential (primary) hypertension: Secondary | ICD-10-CM

## 2013-09-01 DIAGNOSIS — E785 Hyperlipidemia, unspecified: Secondary | ICD-10-CM | POA: Diagnosis not present

## 2013-09-01 DIAGNOSIS — I2510 Atherosclerotic heart disease of native coronary artery without angina pectoris: Secondary | ICD-10-CM | POA: Diagnosis not present

## 2013-09-01 DIAGNOSIS — Z79899 Other long term (current) drug therapy: Secondary | ICD-10-CM

## 2013-09-01 DIAGNOSIS — I701 Atherosclerosis of renal artery: Secondary | ICD-10-CM

## 2013-09-01 NOTE — Patient Instructions (Signed)
  We will see you back in follow up in 6 months with [name] and Dr Gwenlyn Found in 1 year.   Dr Gwenlyn Found has ordered blood work to be done, fasting.

## 2013-09-01 NOTE — Assessment & Plan Note (Signed)
History of coronary artery disease status post distal RCA stenting by Dr. Nona Dell using a Xience V drug-eluting stent 04/27/08 after I was unable to cross this. He's done well since. He denies chest pain or shortness of breath.

## 2013-09-01 NOTE — Assessment & Plan Note (Addendum)
His blood pressure is elevated today but usually at home he measures it on a weekly basis it ranges 130- 150/70-80.

## 2013-09-01 NOTE — Assessment & Plan Note (Signed)
On statin therapy. We will recheck a lipid and liver profile 

## 2013-09-01 NOTE — Assessment & Plan Note (Signed)
Post remote bilateral renal artery stenting with re\re intervention because of "in-stent restenosis with cutting balloons in 2012 ultimately restenting of his left renal artery with an ICast  covered stent in August of last year.his most recent renal Dopplers performed last month reveal a widely patent left renal artery stent with moderate progression in his right renal artery suggesting in-stent restenosis. We'll continue to follow this on a semiannual basis.

## 2013-09-01 NOTE — Progress Notes (Signed)
09/01/2013 Steven Scott   1946/03/22  [id number]  Primary Physician Steven Grills, MD Primary Cardiologist: Steven Harp MD [name]   HPI:  The patient is a very pleasant 68 year old, moderately overweight, married Caucasian male, father of 2 who I last saw in the office 6 months ago. He has a history of persistent hypertension status post bilateral renal artery PTA and stenting with a positive Myoview, as well as heart catheterization that showed a high-grade distal RCA stenosis. I stented both his renal arteries successfully but was unable to cross his distal RCA, which Steven Scott subsequently performed successfully on March 28, 2008, with a Xience V drug-eluting stent with an excellent result. He clinically improved. He had an excellent lipid profile. Renal Dopplers performed January 31, 2011, showed progression of disease in both renal arteries suggesting high "in-stent restenosis." I performed angiography on him October 18 revealing 90% right and 70% left in-stent restenosis with 60 and 40-mm gradients, respectively. He underwent AngioSculpt cutting balloon atherectomy of both renal arteries with an excellent angiographic and followup Doppler result. His blood pressure has remained stable. He is otherwise asymptomatic. He was changed to generic antihypertensive medications off of Tribenzor and apparently his blood pressures have been more difficult to control since that time. He does admit to dietary indiscretion with regards to salt as well.  I saw Steven Scott 04/19/12. He denies chest pain or shortness of breath. Apparently his creatinine has slowly increased now to close to 2. Recent renal Dopplers performed in our office 11/19/12 suggest rapid progression of the in-stent restenosis and throat within the left renal artery stent with a renal/aortic ratio of 7.33. Based on this, I age oriented him on 12/14/12 revealing an 80-90% mid in-stent restenosis" within the left  renal artery stent which I restented with a high cast covered stent. His right renal artery had 60-70% ostial stenosis at that time. Followup Dopplers performed last month revealed normalization of his left renal artery to aortic ratio with mild progression of disease on the right.    Current Outpatient Prescriptions  Medication Sig Dispense Refill  . aspirin EC 325 MG EC tablet Take 1 tablet (325 mg total) by mouth daily.  30 tablet  0  . cholecalciferol (VITAMIN D) 1000 UNITS tablet Take 1,000 Units by mouth daily.      . clobetasol cream (TEMOVATE) [id number] % Apply 1 application topically daily as needed (Psoriasis).      . clopidogrel (PLAVIX) 75 MG tablet Take 1 tablet (75 mg total) by mouth at bedtime.  90 tablet  3  . nebivolol (BYSTOLIC) 5 MG tablet Take 5 mg by mouth daily with breakfast.       . nitroGLYCERIN (NITROSTAT) 0.4 MG SL tablet Place 0.4 mg under the tongue every 5 (five) minutes as needed for chest pain.       . Olmesartan-Amlodipine-HCTZ (TRIBENZOR) 40-10-25 MG TABS Take 1 tablet by mouth daily with breakfast.  90 tablet  3  . simvastatin (ZOCOR) 20 MG tablet Take 1 tablet (20 mg total) by mouth at bedtime.  90 tablet  3   No current facility-administered medications for this visit.    Allergies  Allergen Reactions  . Penicillins Rash    History   Social History  . Marital Status: Married    Spouse Name: N/A    Number of Children: N/A  . Years of Education: N/A   Occupational History  . Not on file.   Social History Main  Topics  . Smoking status: Former Smoker -- 2.00 packs/day for 50 years    Types: Cigarettes    Quit date: 02/24/2011  . Smokeless tobacco: Never Used  . Alcohol Use: Yes     Comment: 12/13/2012 "last alcohol was ~ 4 yr ago"  . Drug Use: No  . Sexual Activity: Not Currently   Other Topics Concern  . Not on file   Social History Narrative  . No narrative on file     Review of Systems: General: negative for chills, fever, night sweats  or weight changes.  Cardiovascular: negative for chest pain, dyspnea on exertion, edema, orthopnea, palpitations, paroxysmal nocturnal dyspnea or shortness of breath Dermatological: negative for rash Respiratory: negative for cough or wheezing Urologic: negative for hematuria Abdominal: negative for nausea, vomiting, diarrhea, bright red blood per rectum, melena, or hematemesis Neurologic: negative for visual changes, syncope, or dizziness All other systems reviewed and are otherwise negative except as noted above.    Blood pressure 172/90, pulse 58, height 5\' 11"  (1.803 m), weight 231 lb 11.2 oz (105.098 kg).  General appearance: alert and no distress Neck: no adenopathy, no carotid bruit, no JVD, supple, symmetrical, trachea midline and thyroid not enlarged, symmetric, no tenderness/mass/nodules Lungs: clear to auscultation bilaterally Heart: regular rate and rhythm, S1, S2 normal, no murmur, click, rub or gallop Extremities: extremities normal, atraumatic, no cyanosis or edema  EKG not performed today  ASSESSMENT AND PLAN:   Renal artery stenosis- Bilat PTA 10/10, 10/12, and Lt RA PTA 8/14 Post remote bilateral renal artery stenting with re\re intervention because of "in-stent restenosis with cutting balloons in 2012 ultimately restenting of his left renal artery with an ICast  covered stent in August of last year.his most recent renal Dopplers performed last month reveal a widely patent left renal artery stent with moderate progression in his right renal artery suggesting in-stent restenosis. We'll continue to follow this on a semiannual basis.  Essential hypertension His blood pressure is elevated today but usually at home he measures it on a weekly basis it ranges 130- 150/70-80.  Hyperlipidemia On statin therapy. We will recheck a lipid and liver profile  Coronary artery disease- RCA DES 11/10 History of coronary artery disease status post distal RCA stenting by Steven Scott  using a Xience V drug-eluting stent 04/27/08 after I was unable to cross this. He's done well since. He denies chest pain or shortness of breath.      Steven Harp MD FACP,FACC,FAHA, [facility] 09/01/2013 9:44 AM

## 2013-09-20 DIAGNOSIS — Z79899 Other long term (current) drug therapy: Secondary | ICD-10-CM | POA: Diagnosis not present

## 2013-09-20 DIAGNOSIS — E785 Hyperlipidemia, unspecified: Secondary | ICD-10-CM | POA: Diagnosis not present

## 2013-09-20 LAB — LIPID PANEL
CHOL/HDL RATIO: 2.6 ratio
CHOLESTEROL: 93 mg/dL (ref 0–200)
HDL: 36 mg/dL — AB (ref >39)
LDL Cholesterol: 46 mg/dL (ref 0–99)
TRIGLYCERIDES: 57 mg/dL (ref <150)
VLDL: 11 mg/dL (ref 0–40)

## 2013-09-20 LAB — HEPATIC FUNCTION PANEL
ALBUMIN: 4 g/dL (ref 3.5–5.2)
ALK PHOS: 70 U/L (ref 39–117)
ALT: 11 U/L (ref 0–53)
AST: 13 U/L (ref 0–37)
BILIRUBIN TOTAL: 0.5 mg/dL (ref 0.2–1.2)
Bilirubin, Direct: 0.1 mg/dL (ref 0.0–0.3)
Indirect Bilirubin: 0.4 mg/dL (ref 0.2–1.2)
TOTAL PROTEIN: 6.3 g/dL (ref 6.0–8.3)

## 2013-09-21 ENCOUNTER — Encounter: Payer: Self-pay | Admitting: *Deleted

## 2013-10-20 DIAGNOSIS — I2510 Atherosclerotic heart disease of native coronary artery without angina pectoris: Secondary | ICD-10-CM | POA: Diagnosis not present

## 2013-10-20 DIAGNOSIS — I10 Essential (primary) hypertension: Secondary | ICD-10-CM | POA: Diagnosis not present

## 2013-10-20 DIAGNOSIS — N183 Chronic kidney disease, stage 3 unspecified: Secondary | ICD-10-CM | POA: Diagnosis not present

## 2013-10-20 DIAGNOSIS — Z6832 Body mass index (BMI) 32.0-32.9, adult: Secondary | ICD-10-CM | POA: Diagnosis not present

## 2013-11-28 DIAGNOSIS — N189 Chronic kidney disease, unspecified: Secondary | ICD-10-CM | POA: Diagnosis not present

## 2013-11-28 DIAGNOSIS — E559 Vitamin D deficiency, unspecified: Secondary | ICD-10-CM | POA: Diagnosis not present

## 2013-11-28 DIAGNOSIS — D649 Anemia, unspecified: Secondary | ICD-10-CM | POA: Diagnosis not present

## 2013-11-28 DIAGNOSIS — R809 Proteinuria, unspecified: Secondary | ICD-10-CM | POA: Diagnosis not present

## 2013-11-30 DIAGNOSIS — E559 Vitamin D deficiency, unspecified: Secondary | ICD-10-CM | POA: Diagnosis not present

## 2013-11-30 DIAGNOSIS — N183 Chronic kidney disease, stage 3 unspecified: Secondary | ICD-10-CM | POA: Diagnosis not present

## 2013-11-30 DIAGNOSIS — I10 Essential (primary) hypertension: Secondary | ICD-10-CM | POA: Diagnosis not present

## 2014-01-11 ENCOUNTER — Telehealth: Payer: Self-pay | Admitting: Cardiovascular Disease

## 2014-01-11 MED ORDER — OLMESARTAN-AMLODIPINE-HCTZ 40-10-25 MG PO TABS: 1.0000 | ORAL_TABLET | Freq: Every day | ORAL | Status: DC

## 2014-01-11 MED ORDER — NEBIVOLOL HCL 5 MG PO TABS: 5.0000 mg | ORAL_TABLET | Freq: Every day | ORAL | Status: DC

## 2014-01-11 MED ORDER — CLOPIDOGREL BISULFATE 75 MG PO TABS: 75.0000 mg | ORAL_TABLET | Freq: Every day | ORAL | Status: DC

## 2014-01-11 MED ORDER — SIMVASTATIN 20 MG PO TABS: 20.0000 mg | ORAL_TABLET | Freq: Every day | ORAL | Status: DC

## 2014-01-11 NOTE — Telephone Encounter (Signed)
Rx was sent to pharmacy electronically. Patient notified.  

## 2014-01-11 NOTE — Telephone Encounter (Signed)
Would like to change pharmacy,he will no longer use the mail order pharmacy.Please call his  Clopidogrel 75 mg #30,Simvastatin 20 mg #30,Tribenzor 40/10/25 mg [id number] and Bystolic 5 mg [id number] to [place].Please call this in today if possible.

## 2014-01-17 ENCOUNTER — Telehealth (HOSPITAL_COMMUNITY): Payer: Self-pay | Admitting: *Deleted

## 2014-01-19 ENCOUNTER — Ambulatory Visit (HOSPITAL_COMMUNITY)
Admission: RE | Admit: 2014-01-19 | Discharge: 2014-01-19 | Disposition: A | Discharge status: Home / Self Care | Payer: Medicare Other | Source: Ambulatory Visit | Attending: Cardiovascular Disease | Admitting: Cardiovascular Disease

## 2014-01-19 DIAGNOSIS — R141 Gas pain: Secondary | ICD-10-CM | POA: Diagnosis not present

## 2014-01-19 DIAGNOSIS — I701 Atherosclerosis of renal artery: Secondary | ICD-10-CM | POA: Insufficient documentation

## 2014-01-19 DIAGNOSIS — R9439 Abnormal result of other cardiovascular function study: Secondary | ICD-10-CM | POA: Insufficient documentation

## 2014-01-19 DIAGNOSIS — R142 Eructation: Secondary | ICD-10-CM

## 2014-01-19 DIAGNOSIS — R143 Flatulence: Secondary | ICD-10-CM

## 2014-01-19 NOTE — Progress Notes (Signed)
Renal Artery Duplex Completed. °Brianna L Mazza,RVT °

## 2014-01-24 DIAGNOSIS — D485 Neoplasm of uncertain behavior of skin: Secondary | ICD-10-CM | POA: Diagnosis not present

## 2014-01-24 DIAGNOSIS — L820 Inflamed seborrheic keratosis: Secondary | ICD-10-CM | POA: Diagnosis not present

## 2014-01-24 DIAGNOSIS — L408 Other psoriasis: Secondary | ICD-10-CM | POA: Diagnosis not present

## 2014-01-24 DIAGNOSIS — L570 Actinic keratosis: Secondary | ICD-10-CM | POA: Diagnosis not present

## 2014-01-27 DIAGNOSIS — Z23 Encounter for immunization: Secondary | ICD-10-CM | POA: Diagnosis not present

## 2014-01-31 ENCOUNTER — Encounter: Payer: Self-pay | Admitting: Cardiology

## 2014-01-31 ENCOUNTER — Telehealth: Payer: Self-pay | Admitting: Cardiovascular Disease

## 2014-01-31 ENCOUNTER — Ambulatory Visit (INDEPENDENT_AMBULATORY_CARE_PROVIDER_SITE_OTHER): Payer: Medicare Other | Admitting: Cardiology

## 2014-01-31 ENCOUNTER — Encounter: Payer: Self-pay | Admitting: Cardiovascular Disease

## 2014-01-31 VITALS — BP 152/60 | HR 64 | Ht 71.0 in | Wt 228.7 lb

## 2014-01-31 DIAGNOSIS — E669 Obesity, unspecified: Secondary | ICD-10-CM

## 2014-01-31 DIAGNOSIS — I2510 Atherosclerotic heart disease of native coronary artery without angina pectoris: Secondary | ICD-10-CM | POA: Diagnosis not present

## 2014-01-31 DIAGNOSIS — I701 Atherosclerosis of renal artery: Secondary | ICD-10-CM

## 2014-01-31 DIAGNOSIS — N183 Chronic kidney disease, stage 3 unspecified: Secondary | ICD-10-CM

## 2014-01-31 DIAGNOSIS — Z01812 Encounter for preprocedural laboratory examination: Secondary | ICD-10-CM

## 2014-01-31 DIAGNOSIS — E785 Hyperlipidemia, unspecified: Secondary | ICD-10-CM

## 2014-01-31 DIAGNOSIS — I10 Essential (primary) hypertension: Secondary | ICD-10-CM

## 2014-01-31 DIAGNOSIS — D689 Coagulation defect, unspecified: Secondary | ICD-10-CM

## 2014-01-31 NOTE — Assessment & Plan Note (Signed)
Controlled.  

## 2014-01-31 NOTE — Assessment & Plan Note (Signed)
On low dose statin

## 2014-01-31 NOTE — Assessment & Plan Note (Signed)
Last SCr was Aug 2014- 1.6

## 2014-01-31 NOTE — Assessment & Plan Note (Signed)
No angina 

## 2014-01-31 NOTE — Patient Instructions (Signed)
Dr. Gwenlyn Found has ordered a peripheral angiogram (renal) to be done at [facility].  This procedure is going to look at the bloodflow in your lower extremities.  If Dr. Gwenlyn Found is able to open up the arteries, you will have to spend one night in the hospital.  If he is not able to open the arteries, you will be able to go home that same day.    After the procedure, you will not be allowed to drive for 3 days or push, pull, or lift anything greater than 10 lbs for one week.    You will be required to have the following tests prior to the procedure:  1. Blood work-the blood work can be done no more than 7 days prior to the procedure.  It can be done at any [facility] lab.  There is one downstairs on the first floor of this building and one in the [place] ([address])  2. Chest Xray-the chest xray order has already been placed at the [place].     *[employer]

## 2014-01-31 NOTE — Progress Notes (Signed)
01/31/2014 Steven Scott   [birth year]  SN:[id number]  Primary Physicia Rocky Morel, MD Primary Cardiologist: Dr Gwenlyn Found  HPI: Pt is a pleasant 68 year old, moderately overweight, married Caucasian male, father of 2 from [place] who is followed by Dr Gwenlyn Found. He has a history of persistent hypertension status post bilateral renal artery PTA and stenting in 2009. At that time he also had a positive Myoview. Heart catheterization then that showed a high-grade distal RCA stenosis. Dr. Claiborne Billings subsequently performed successfully on March 28, 2008, with a Xience V drug-eluting stent with an excellent result. Myoview 2010 was low risk and he has not had recurrent angina.          Renal Dopplers performed in September 2014, showed progression of disease in both renal arteries suggesting LRA "in-stent restenosis." Dr Gwenlyn Found performed angiography on him February 27 2011 revealing 90% right and 70% left in-stent restenosis with 60 and 40-mm gradients, respectively. He underwent AngioSculpt cutting balloon atherectomy of both renal arteries with an excellent angiographic and followup Doppler result. In Oct 2014 he had Lt RA ISR treated with successful PTA and stenting 12/14/12. Dopplers in March were OK but dopplers done 01/19/14 showed 60-99% bilateral RA ISR.     Current Outpatient Prescriptions  Medication Sig Dispense Refill  . aspirin EC 325 MG EC tablet Take 1 tablet (325 mg total) by mouth daily.  30 tablet  0  . cholecalciferol (VITAMIN D) 1000 UNITS tablet Take 1,000 Units by mouth daily.      . clobetasol cream (TEMOVATE) AB-123456789 % Apply 1 application topically daily as needed (Psoriasis).      . clopidogrel (PLAVIX) 75 MG tablet Take 1 tablet (75 mg total) by mouth at bedtime.  30 tablet  7  . nebivolol (BYSTOLIC) 5 MG tablet Take 1 tablet (5 mg total) by mouth daily with breakfast.  30 tablet  7  . nitroGLYCERIN (NITROSTAT) 0.4 MG SL tablet Place 0.4 mg under the tongue every 5 (five) minutes  as needed for chest pain.       . Olmesartan-Amlodipine-HCTZ (TRIBENZOR) 40-10-25 MG TABS Take 1 tablet by mouth daily with breakfast.  30 tablet  7  . simvastatin (ZOCOR) 20 MG tablet Take 1 tablet (20 mg total) by mouth at bedtime.  30 tablet  7   No current facility-administered medications for this visit.    Allergies  Allergen Reactions  . Penicillins Rash    History   Social History  . Marital Status: Married    Spouse Name: N/A    Number of Children: N/A  . Years of Education: N/A   Occupational History  . Not on file.   Social History Main Topics  . Smoking status: Former Smoker -- 2.00 packs/day for 50 years    Types: Cigarettes    Quit date: 02/24/2011  . Smokeless tobacco: Never Used  . Alcohol Use: Yes     Comment: 12/13/2012 "last alcohol was ~ 4 yr ago"  . Drug Use: No  . Sexual Activity: Not Currently   Other Topics Concern  . Not on file   Social History Narrative  . No narrative on file     Review of Systems: General: negative for chills, fever, night sweats or weight changes.  Cardiovascular: negative for chest pain, dyspnea on exertion, edema, orthopnea, palpitations, paroxysmal nocturnal dyspnea or shortness of breath Dermatological: negative for rash Respiratory: negative for cough or wheezing Urologic: negative for hematuria Abdominal: negative for nausea, vomiting, diarrhea, bright  red blood per rectum, melena, or hematemesis Neurologic: negative for visual changes, syncope, or dizziness All other systems reviewed and are otherwise negative except as noted above.    Blood pressure 152/60, pulse 64, height 5\' 11"  (1.803 m), weight 228 lb 11.2 oz (103.738 kg).  General appearance: alert, cooperative, no distress and moderately obese Neck: no carotid bruit and no JVD Lungs: clear to auscultation bilaterally Heart: regular rate and rhythm Abdomen: truncal obesity Extremities: no edema Pulses: diminnished pulses Skin: cool pale and  dry Neurologic: Grossly normal  EKG NSR, NSST changes  ASSESSMENT AND PLAN:   Renal artery stenosis- Bilat PTA 10/10, 10/12, and Lt RA PTA 8/14 Lt RA ISR suggested by recent dopplers  Chronic renal insufficiency, stage III (moderate) Last SCr was Aug 2014- 1.6  Essential hypertension Controlled  Coronary artery disease- RCA DES 11/10 No angina  Obesity BMI 32- negative sleep study in the past  Hyperlipidemia On low dose statin   PLAN  Pt was seen by Dr Gwenlyn Found and myself in the office today. He will need to be set up for an OP PVA. He may require overnight admission secondary pending his labs to renal insufficiency.   Torrence Branagan KPA-C 01/31/2014 11:46 AM

## 2014-01-31 NOTE — Assessment & Plan Note (Signed)
BMI 32- negative sleep study in the past

## 2014-01-31 NOTE — Assessment & Plan Note (Signed)
Lt RA ISR suggested by recent dopplers

## 2014-01-31 NOTE — Telephone Encounter (Signed)
Spoke with Melina Schools regarding procedure date and time for this patient--Thursday 02/16/14 @ 7:30 am with Dr. Gwenlyn Found at [facility] voiced his understanding.

## 2014-02-01 ENCOUNTER — Other Ambulatory Visit: Payer: Self-pay | Admitting: *Deleted

## 2014-02-01 DIAGNOSIS — Z01818 Encounter for other preprocedural examination: Secondary | ICD-10-CM

## 2014-02-09 HISTORY — PX: RENAL ARTERY ANGIOPLASTY: SHX2317

## 2014-02-10 ENCOUNTER — Encounter (HOSPITAL_COMMUNITY): Payer: Self-pay | Admitting: Pharmacy Technician

## 2014-02-13 ENCOUNTER — Ambulatory Visit
Admission: RE | Admit: 2014-02-13 | Discharge: 2014-02-13 | Disposition: A | Discharge status: Home / Self Care | Payer: Medicare Other | Source: Ambulatory Visit | Attending: Cardiovascular Disease | Admitting: Cardiovascular Disease

## 2014-02-13 DIAGNOSIS — Z01818 Encounter for other preprocedural examination: Secondary | ICD-10-CM | POA: Diagnosis not present

## 2014-02-13 DIAGNOSIS — Z01812 Encounter for preprocedural laboratory examination: Secondary | ICD-10-CM

## 2014-02-13 DIAGNOSIS — I10 Essential (primary) hypertension: Secondary | ICD-10-CM | POA: Diagnosis not present

## 2014-02-13 LAB — COMPREHENSIVE METABOLIC PANEL
ALK PHOS: 70 U/L (ref 39–117)
ALT: 9 U/L (ref 0–53)
AST: 13 U/L (ref 0–37)
Albumin: 4.4 g/dL (ref 3.5–5.2)
BILIRUBIN TOTAL: 0.4 mg/dL (ref 0.2–1.2)
BUN: 37 mg/dL — AB (ref 6–23)
CO2: 27 mEq/L (ref 19–32)
Calcium: 9.4 mg/dL (ref 8.4–10.5)
Chloride: 105 mEq/L (ref 96–112)
Creat: 1.8 mg/dL — ABNORMAL HIGH (ref 0.50–1.35)
GLUCOSE: 94 mg/dL (ref 70–99)
Potassium: 4.2 mEq/L (ref 3.5–5.3)
SODIUM: 139 meq/L (ref 135–145)
Total Protein: 6.9 g/dL (ref 6.0–8.3)

## 2014-02-13 LAB — PROTIME-INR
INR: 1.12 (ref <1.50)
Prothrombin Time: 14.4 seconds (ref 11.6–15.2)

## 2014-02-13 LAB — CBC
HEMATOCRIT: 41.2 % (ref 39.0–52.0)
HEMOGLOBIN: 13.6 g/dL (ref 13.0–17.0)
MCH: 28.9 pg (ref 26.0–34.0)
MCHC: 33 g/dL (ref 30.0–36.0)
MCV: 87.5 fL (ref 78.0–100.0)
Platelets: 214 10*3/uL (ref 150–400)
RBC: 4.71 MIL/uL (ref 4.22–5.81)
RDW: 14.3 % (ref 11.5–15.5)
WBC: 7.8 10*3/uL (ref 4.0–10.5)

## 2014-02-13 LAB — APTT: aPTT: 39 seconds — ABNORMAL HIGH (ref 24–37)

## 2014-02-15 ENCOUNTER — Encounter (HOSPITAL_COMMUNITY): Payer: Self-pay | Admitting: Cardiology

## 2014-02-15 ENCOUNTER — Ambulatory Visit (HOSPITAL_COMMUNITY)
Admission: AD | Admit: 2014-02-15 | Discharge: 2014-02-17 | Disposition: A | Discharge status: Home / Self Care | Payer: Medicare Other | Source: Ambulatory Visit | Attending: Cardiovascular Disease | Admitting: Cardiovascular Disease

## 2014-02-15 DIAGNOSIS — Z79899 Other long term (current) drug therapy: Secondary | ICD-10-CM | POA: Insufficient documentation

## 2014-02-15 DIAGNOSIS — Z6832 Body mass index (BMI) 32.0-32.9, adult: Secondary | ICD-10-CM | POA: Insufficient documentation

## 2014-02-15 DIAGNOSIS — Y831 Surgical operation with implant of artificial internal device as the cause of abnormal reaction of the patient, or of later complication, without mention of misadventure at the time of the procedure: Secondary | ICD-10-CM | POA: Diagnosis not present

## 2014-02-15 DIAGNOSIS — I129 Hypertensive chronic kidney disease with stage 1 through stage 4 chronic kidney disease, or unspecified chronic kidney disease: Secondary | ICD-10-CM | POA: Diagnosis not present

## 2014-02-15 DIAGNOSIS — Z7902 Long term (current) use of antithrombotics/antiplatelets: Secondary | ICD-10-CM | POA: Insufficient documentation

## 2014-02-15 DIAGNOSIS — Z87891 Personal history of nicotine dependence: Secondary | ICD-10-CM | POA: Insufficient documentation

## 2014-02-15 DIAGNOSIS — Z01818 Encounter for other preprocedural examination: Secondary | ICD-10-CM

## 2014-02-15 DIAGNOSIS — Z7982 Long term (current) use of aspirin: Secondary | ICD-10-CM | POA: Insufficient documentation

## 2014-02-15 DIAGNOSIS — E785 Hyperlipidemia, unspecified: Secondary | ICD-10-CM | POA: Diagnosis not present

## 2014-02-15 DIAGNOSIS — E669 Obesity, unspecified: Secondary | ICD-10-CM | POA: Diagnosis not present

## 2014-02-15 DIAGNOSIS — T82858A Stenosis of vascular prosthetic devices, implants and grafts, initial encounter: Secondary | ICD-10-CM | POA: Insufficient documentation

## 2014-02-15 DIAGNOSIS — I701 Atherosclerosis of renal artery: Secondary | ICD-10-CM | POA: Diagnosis not present

## 2014-02-15 DIAGNOSIS — N183 Chronic kidney disease, stage 3 (moderate): Secondary | ICD-10-CM | POA: Insufficient documentation

## 2014-02-15 DIAGNOSIS — I10 Essential (primary) hypertension: Secondary | ICD-10-CM | POA: Diagnosis present

## 2014-02-15 LAB — BASIC METABOLIC PANEL
ANION GAP: 13 (ref 5–15)
BUN: 29 mg/dL — ABNORMAL HIGH (ref 6–23)
CALCIUM: 9.1 mg/dL (ref 8.4–10.5)
CO2: 23 meq/L (ref 19–32)
Chloride: 104 mEq/L (ref 96–112)
Creatinine, Ser: 1.51 mg/dL — ABNORMAL HIGH (ref 0.50–1.35)
GFR calc non Af Amer: 45 mL/min — ABNORMAL LOW (ref >90)
GFR, EST AFRICAN AMERICAN: 53 mL/min — AB (ref >90)
Glucose, Bld: 81 mg/dL (ref 70–99)
Potassium: 3.9 mEq/L (ref 3.7–5.3)
SODIUM: 140 meq/L (ref 137–147)

## 2014-02-15 MED ORDER — NEBIVOLOL HCL 5 MG PO TABS
5.0000 mg | ORAL_TABLET | Freq: Every day | ORAL | Status: DC
  Administered 2014-02-16 – 2014-02-17 (×2): 5 mg via ORAL
  Filled 2014-02-15 (×4): qty 1

## 2014-02-15 MED ORDER — SIMVASTATIN 20 MG PO TABS
20.0000 mg | ORAL_TABLET | Freq: Every day | ORAL | Status: DC
  Administered 2014-02-15: 20 mg via ORAL
  Filled 2014-02-15 (×3): qty 1

## 2014-02-15 MED ORDER — SODIUM CHLORIDE 0.9 % IV SOLN
INTRAVENOUS | Status: DC
  Administered 2014-02-15: 17:00:00 via INTRAVENOUS
  Administered 2014-02-16: 100 mL/h via INTRAVENOUS

## 2014-02-15 MED ORDER — CLOPIDOGREL BISULFATE 75 MG PO TABS
75.0000 mg | ORAL_TABLET | Freq: Every day | ORAL | Status: AC
  Administered 2014-02-15 – 2014-02-16 (×2): 75 mg via ORAL
  Filled 2014-02-15 (×3): qty 1

## 2014-02-15 MED ORDER — SODIUM CHLORIDE 0.9 % IV SOLN: 250.0000 mL | INTRAVENOUS | Status: DC | PRN

## 2014-02-15 MED ORDER — SODIUM CHLORIDE 0.9 % IV SOLN: INTRAVENOUS | Status: DC

## 2014-02-15 MED ORDER — ASPIRIN 81 MG PO CHEW
81.0000 mg | CHEWABLE_TABLET | ORAL | Status: AC
  Administered 2014-02-16: 81 mg via ORAL
  Filled 2014-02-15: qty 1

## 2014-02-15 MED ORDER — ASPIRIN 81 MG PO CHEW
81.0000 mg | CHEWABLE_TABLET | Freq: Once | ORAL | Status: AC
  Administered 2014-02-15: 81 mg via ORAL
  Filled 2014-02-15: qty 1

## 2014-02-15 MED ORDER — NITROGLYCERIN 0.4 MG SL SUBL: 0.4000 mg | SUBLINGUAL_TABLET | SUBLINGUAL | Status: DC | PRN

## 2014-02-15 MED ORDER — ACETAMINOPHEN 500 MG PO TABS
500.0000 mg | ORAL_TABLET | Freq: Four times a day (QID) | ORAL | Status: DC | PRN
  Administered 2014-02-15: 500 mg via ORAL
  Filled 2014-02-15: qty 1

## 2014-02-15 MED ORDER — SODIUM CHLORIDE 0.9 % IJ SOLN: 3.0000 mL | INTRAMUSCULAR | Status: DC | PRN

## 2014-02-15 MED ORDER — ASPIRIN 81 MG PO CHEW: 81.0000 mg | CHEWABLE_TABLET | ORAL | Status: DC

## 2014-02-15 MED ORDER — SODIUM CHLORIDE 0.9 % IJ SOLN: 3.0000 mL | Freq: Two times a day (BID) | INTRAMUSCULAR | Status: DC

## 2014-02-15 MED ORDER — ASPIRIN 81 MG PO CHEW: 81.0000 mg | CHEWABLE_TABLET | Freq: Every day | ORAL | Status: DC

## 2014-02-15 NOTE — Progress Notes (Signed)
  Patient Profile: 68 y/o male with h/o bilateral RAS, s/p multiple interventions, presents back for pre-cath hydration prior to undergoing repeat renal angiogram +/- intervention as recent doppler studies reveal left RA ISR.   Patient was seen and evaluated by [name], PA-C, in clinic on 01/31/14. See note for complete details.    Subjective: Roomed and stable. No complaints.    Objective: Vital signs in last 24 hours: Temp:  [97.3 F (36.3 C)-97.5 F (36.4 C)] 97.3 F (36.3 C) (10/07 1332) Pulse Rate:  [57-63] 57 (10/07 1332) Resp:  [18] 18 (10/07 1332) BP: (134-173)/(48-60) 173/60 mmHg (10/07 1332) SpO2:  [100 %] 100 % (10/07 1332) Weight:  [230 lb 8 oz (104.554 kg)] 230 lb 8 oz (104.554 kg) (10/07 1156) Last BM Date: 02/15/14  Intake/Output from previous day:   Intake/Output this shift:    Medications Current Facility-Administered Medications  Medication Dose Route Frequency Provider Last Rate Last Dose  . [START ON 02/16/2014] 0.9 %  sodium chloride infusion   Intravenous Continuous [name], MD      . Derrill Memo ON 02/16/2014] aspirin chewable tablet 81 mg  81 mg Oral Pre-Cath [name], MD      . sodium chloride 0.9 % injection 3 mL  3 mL Intravenous PRN [name], MD        PE: General appearance: alert, cooperative, no distress and moderately obese Lungs: clear to auscultation bilaterally Heart: regular rate and rhythm, S1, S2 normal, no murmur, click, rub or gallop Extremities: no LEE Pulses: 2+ and symmetric Skin: warm and dry Neurologic: Grossly normal  Lab Results:   Recent Labs  02/13/14 1110  WBC 7.8  HGB 13.6  HCT 41.2  PLT 214   BMET  Recent Labs  02/13/14 1110  NA 139  K 4.2  CL 105  CO2 27  GLUCOSE 94  BUN 37*  CREATININE 1.80*  CALCIUM 9.4   PT/INR  Recent Labs  02/13/14 1110  LABPROT 14.4  INR 1.12    Assessment/Plan  Active Problems:   Renal artery stenosis- Bilat PTA 10/10, 10/12, and Lt RA PTA  8/14   Chronic renal insufficiency, stage III (moderate)  1. Bilateral Renal Artery Disease: recent doppler study was abnormal and concerning for left RA ISR. Plan for diagnostic renal angiogram +/- intervention with Dr. Gwenlyn Found tomorrow. NPO at midnight. Pre-cath orders have been placed.   2. Chronic renal insufficiency: SCr on arrival is 1.80. Will start hydration with IVFs. Will hold ARB and HCTZ.     LOS: 0 days    Brittainy M. Rosita Fire, PA-C 02/15/2014 3:35 PM  Ron Parker

## 2014-02-16 ENCOUNTER — Encounter (HOSPITAL_COMMUNITY)
Admission: AD | Disposition: A | Discharge status: Home / Self Care | Payer: Self-pay | Source: Ambulatory Visit | Attending: Cardiovascular Disease

## 2014-02-16 DIAGNOSIS — I701 Atherosclerosis of renal artery: Secondary | ICD-10-CM | POA: Diagnosis not present

## 2014-02-16 HISTORY — PX: RENAL ANGIOGRAM: SHX5509

## 2014-02-16 HISTORY — PX: PERCUTANEOUS STENT INTERVENTION: SHX5500

## 2014-02-16 LAB — BASIC METABOLIC PANEL
ANION GAP: 10 (ref 5–15)
BUN: 27 mg/dL — ABNORMAL HIGH (ref 6–23)
CALCIUM: 9 mg/dL (ref 8.4–10.5)
CO2: 25 meq/L (ref 19–32)
Chloride: 107 mEq/L (ref 96–112)
Creatinine, Ser: 1.62 mg/dL — ABNORMAL HIGH (ref 0.50–1.35)
GFR calc non Af Amer: 42 mL/min — ABNORMAL LOW (ref >90)
GFR, EST AFRICAN AMERICAN: 48 mL/min — AB (ref >90)
Glucose, Bld: 100 mg/dL — ABNORMAL HIGH (ref 70–99)
Potassium: 4.6 mEq/L (ref 3.7–5.3)
Sodium: 142 mEq/L (ref 137–147)

## 2014-02-16 LAB — POCT ACTIVATED CLOTTING TIME
ACTIVATED CLOTTING TIME: 191 s
Activated Clotting Time: 259 seconds
Activated Clotting Time: 202 seconds
Activated Clotting Time: 157 seconds

## 2014-02-16 LAB — CBC
HEMATOCRIT: 40.2 % (ref 39.0–52.0)
Hemoglobin: 13.4 g/dL (ref 13.0–17.0)
MCH: 29.2 pg (ref 26.0–34.0)
MCHC: 33.3 g/dL (ref 30.0–36.0)
MCV: 87.6 fL (ref 78.0–100.0)
PLATELETS: 187 10*3/uL (ref 150–400)
RBC: 4.59 MIL/uL (ref 4.22–5.81)
RDW: 13.7 % (ref 11.5–15.5)
WBC: 7.9 10*3/uL (ref 4.0–10.5)

## 2014-02-16 LAB — PROTIME-INR
INR: 1.18 (ref 0.00–1.49)
PROTHROMBIN TIME: 15 s (ref 11.6–15.2)

## 2014-02-16 SURGERY — RENAL ANGIOGRAM
Anesthesia: LOCAL

## 2014-02-16 MED ORDER — HYDRALAZINE HCL 20 MG/ML IJ SOLN
10.0000 mg | INTRAMUSCULAR | Status: DC | PRN
  Administered 2014-02-17: 04:00:00 10 mg via INTRAVENOUS
  Filled 2014-02-16: qty 1

## 2014-02-16 MED ORDER — ONDANSETRON HCL 4 MG/2ML IJ SOLN
4.0000 mg | Freq: Four times a day (QID) | INTRAMUSCULAR | Status: DC | PRN
Start: 2014-02-16 — End: 2014-02-17
  Administered 2014-02-16 (×2): 4 mg via INTRAVENOUS
  Filled 2014-02-16: qty 2

## 2014-02-16 MED ORDER — CLOPIDOGREL BISULFATE 75 MG PO TABS: 75.0000 mg | ORAL_TABLET | Freq: Every day | ORAL | Status: DC

## 2014-02-16 MED ORDER — ACETAMINOPHEN 325 MG PO TABS
ORAL_TABLET | ORAL | Status: AC
  Filled 2014-02-16: qty 2

## 2014-02-16 MED ORDER — MORPHINE SULFATE 2 MG/ML IJ SOLN
INTRAMUSCULAR | Status: AC
  Filled 2014-02-16: qty 1

## 2014-02-16 MED ORDER — ACETAMINOPHEN 325 MG PO TABS: 650.0000 mg | ORAL_TABLET | ORAL | Status: DC | PRN

## 2014-02-16 MED ORDER — ONDANSETRON HCL 4 MG/2ML IJ SOLN
INTRAMUSCULAR | Status: AC
  Filled 2014-02-16: qty 2

## 2014-02-16 MED ORDER — SODIUM CHLORIDE 0.9 % IV SOLN
INTRAVENOUS | Status: AC
  Administered 2014-02-16: 75 mL/h via INTRAVENOUS

## 2014-02-16 MED ORDER — HEPARIN SODIUM (PORCINE) 1000 UNIT/ML IJ SOLN
INTRAMUSCULAR | Status: AC
  Filled 2014-02-16: qty 1

## 2014-02-16 MED ORDER — LIDOCAINE HCL (PF) 1 % IJ SOLN
INTRAMUSCULAR | Status: AC
  Filled 2014-02-16: qty 30

## 2014-02-16 MED ORDER — FENTANYL CITRATE 0.05 MG/ML IJ SOLN
INTRAMUSCULAR | Status: AC
  Filled 2014-02-16: qty 2

## 2014-02-16 MED ORDER — MORPHINE SULFATE 2 MG/ML IJ SOLN
2.0000 mg | INTRAMUSCULAR | Status: DC | PRN
  Administered 2014-02-16: 2 mg via INTRAVENOUS

## 2014-02-16 MED ORDER — HEPARIN (PORCINE) IN NACL 2-0.9 UNIT/ML-% IJ SOLN
INTRAMUSCULAR | Status: AC
  Filled 2014-02-16: qty 1000

## 2014-02-16 MED ORDER — PROMETHAZINE HCL 25 MG/ML IJ SOLN
6.2500 mg | Freq: Four times a day (QID) | INTRAMUSCULAR | Status: DC | PRN
  Filled 2014-02-16: qty 1

## 2014-02-16 MED ORDER — ASPIRIN EC 325 MG PO TBEC
325.0000 mg | DELAYED_RELEASE_TABLET | Freq: Every day | ORAL | Status: DC
  Filled 2014-02-16: qty 1

## 2014-02-16 MED ORDER — MIDAZOLAM HCL 2 MG/2ML IJ SOLN
INTRAMUSCULAR | Status: AC
  Filled 2014-02-16: qty 2

## 2014-02-16 MED ORDER — AMLODIPINE BESYLATE 10 MG PO TABS
10.0000 mg | ORAL_TABLET | Freq: Every day | ORAL | Status: DC
  Administered 2014-02-16: 21:00:00 10 mg via ORAL
  Filled 2014-02-16 (×2): qty 1

## 2014-02-16 NOTE — Progress Notes (Signed)
Pt states pain med was effective at level 4 on a scale of 10-10.  Pt transported to [facility] and report given to [employer].  Pt was able to void a total of 400cc of yellow urine

## 2014-02-16 NOTE — Interval H&P Note (Signed)
History and Physical Interval Note:  02/16/2014 7:52 AM  Steven Scott  has presented today for surgery, with the diagnosis of ras  The various methods of treatment have been discussed with the patient and family. After consideration of risks, benefits and other options for treatment, the patient has consented to  Procedure(s): RENAL ANGIOGRAM (N/A) as a surgical intervention .  The patient's history has been reviewed, patient examined, no change in status, stable for surgery.  I have reviewed the patient's chart and labs.  Questions were answered to the patient's satisfaction.     [name]

## 2014-02-16 NOTE — Care Management Note (Addendum)
  Page 1 of 1   02/16/2014     2:50:59 PM CARE MANAGEMENT NOTE 02/16/2014  Patient:  Steven, Scott   Account Number:  [id number]  Date Initiated:  02/16/2014  Documentation initiated by:  Charle Clear  Subjective/Objective Assessment:   Renal artery stenosis- Bilat PTA 10/10, 10/12, and Lt RA PTA 8/14     Action/Plan:   CM to follow for dipsosition needs   Anticipated DC Date:  02/17/2014   Anticipated DC Plan:  HOME/SELF CARE         Choice offered to / List presented to:             Status of service:  Completed, signed off Medicare Important Message given?   (If response is "NO", the following Medicare IM given date fields will be blank) Date Medicare IM given:   Medicare IM given by:   Date Additional Medicare IM given:   Additional Medicare IM given by:    Discharge Disposition:  HOME/SELF CARE  Per UR Regulation:    If discussed at Long Length of Stay Meetings, dates discussed:    Comments:  Steven Emond RN, BSN, MSHL, CCM  Nurse - Case Manager,  (Unit [phone]  02/16/2014 Meds Review:  Plavix Dispo Plan:  Home / Self care.

## 2014-02-16 NOTE — Progress Notes (Signed)
Pt complaint of nausea.  Zofran given as ordered.  ACT 157 .

## 2014-02-16 NOTE — Progress Notes (Signed)
Site area: Right groin 74fr sheath was removed  Site Prior to Removal:  Level 0  Pressure Applied For 20 MINUTES    Minutes Beginning at 1015am  Manual:   Yes.    Patient Status During Pull:  Stable  Nausea subsided  Post Pull Groin Site:  Level 0  Post Pull Instructions Given:  Yes.    Post Pull Pulses Present:  Yes.    Dressing Applied:  Yes.    Comments: VS remain stable during sheath removal.  Pt denis and any discomfort at this time.

## 2014-02-16 NOTE — CV Procedure (Signed)
Steven Scott is a 68 y.o. male    SN:[id number] LOCATION:  FACILITY: [facility]  PHYSICIAN: Steven Scott, M.D. Jan 29, 1946   DATE OF PROCEDURE:  02/16/2014  DATE OF DISCHARGE:     PV Angiogram/Intervention    History obtained from chart review.Pt is a pleasant 68 year old, moderately overweight, married Caucasian male, father of 2 from [place] who is followed by Steven Scott. He has a history of persistent hypertension status post bilateral renal artery PTA and stenting in 2009. At that time he also had a positive Myoview. Heart catheterization then that showed a high-grade distal RCA stenosis. Steven. Claiborne Scott subsequently performed successfully on March 28, 2008, with a Xience V drug-eluting stent with an excellent result. Myoview 2010 was low risk and he has not had recurrent angina.  Renal Dopplers performed in September 2014, showed progression of disease in both renal arteries suggesting LRA "in-stent restenosis." Steven Scott performed angiography on him February 27 2011 revealing 90% right and 70% left in-stent restenosis with 60 and 40-mm gradients, respectively. He underwent AngioSculpt cutting balloon atherectomy of both renal arteries with an excellent angiographic and followup Doppler result. In Oct 2014 he had Lt RA ISR treated with successful PTA and stenting 12/14/12. Dopplers in March were OK but dopplers done 01/19/14 showed 60-99% bilateral RA ISR. The patient presents now for angiography and potential intervention for ischemic nephropathy and renal preservation    PROCEDURE DESCRIPTION:   The patient was brought to the second floor [facility] cath lab in the postabsorptive state. He was premedicated with Valium 5 mg by mouth, IV Versed and fentanyl. His right groinwas prepped and shaved in usual sterile fashion. Xylocaine 1% was used for local anesthesia. A 6 French sheath was inserted into the right common femoral artery using standard Seldinger technique.a short 5 [place] crossover  catheters used for selective right and left renal artery angiography. Omnipaque dye was used for the entirety of the case (116 cc and this patient). Retrograde aortic pressure was monitored during case.   HEMODYNAMICS:    AO SYSTOLIC/AO DIASTOLIC: 0000000   Angiographic Data:   1: Left renal artery-75% hypodense lesion in the distal aspect of the previously placed left renal artery stent extending just beyond the stented segment  2: Right renal artery-85-90% ostial/proximal right renal artery stenosis  IMPRESSION:Steven Scott has high-grade bilateral "in-stent restenosis of his previously placed renal stents with increasing creatinine and elevated velocities. We'll proceed with re\re intervention using an ICast covered stent on the left than cutting balloon angioplasty on the right  Procedure Description:the patient received a total of 6000 units of heparin intravenously with an ACT of 289 at peak falling to 191 and by the end of the procedure. Visipaque allergies for the entirety of the case. Using a 6 [place] short right Judkins guide catheter the left renal artery stent was engaged and an 014 stabilizer wire was then placed across the lesion. This was predilated with a 4 mm x 2 cm balloon. Following this an endhole cath was used to suction out the 0.14  wire for an 0.35  Rosen wire. I then switched out the previously placed short 6 French sheath for a 45 cm 6 French [facility] sheath. I advanced the [facility] sheath into the ostium of the left renal artery and removed the previously placed guide catheter. I then placed a 5 mm x 16 mm long ICast covered stent in the distal aspect of the previously placed stent and deployed at nominal pressures. Post the  entire stented segment with a 6 mm x 2 cm balloon at high pressures resulting reduction of a 75% hypodense lesion to 0% residual with excellent flow and no dissection.  Following this engaged the ostium of the right renal artery with the 6 French short right  Judkins guide catheter and was able to get a sparkler wire across this. I predilated with a 4 mm by consummate balloon. I then performed angioscope cutting balloon arthrectomy with a 5 mm x 20 mm long Angiosculpt balloon dilatation catheter up to 16 atmospheres resulting reduction a 90% stenosis to 0% residual. The patient tolerated the procedure well. The long sheath was exchanged over an 035 wire for a short 6 [place] sheath. The patient left the lab in stable condition.  Final Impression: successful restenting of the left renal artery with an ICast covered stent and cutting balloon atherectomy of in-stent restenosis at the origin and proximal portion of the right renal artery stent. The patient tolerated the procedure well. The sheath will be removed once the ACT falls below 170 and pressure will be held. He will be hydrated overnight. He is already on dual antiplatelet therapy.the patient left the lab in stable condition. He will be hydrated overnight, discharged home in the morning and will obtain renal Doppler studies in her [place] office in one week followed by a return office visit with the mid-level provider in 2-3 weeks on that date I am in the office.    Steven Harp MD, [facility] 02/16/2014 9:37 AM

## 2014-02-16 NOTE — H&P (View-Only) (Signed)
01/31/2014 Steven Scott   July 02, 1945  SN:[id number]  Primary Physicia Rocky Morel, MD Primary Cardiologist: Dr Gwenlyn Found  HPI: Pt is a pleasant 68 year old, moderately overweight, married Caucasian male, father of 2 from [place] who is followed by Dr Gwenlyn Found. He has a history of persistent hypertension status post bilateral renal artery PTA and stenting in 2009. At that time he also had a positive Myoview. Heart catheterization then that showed a high-grade distal RCA stenosis. Dr. Claiborne Billings subsequently performed successfully on March 28, 2008, with a Xience V drug-eluting stent with an excellent result. Myoview 2010 was low risk and he has not had recurrent angina.          Renal Dopplers performed in September 2014, showed progression of disease in both renal arteries suggesting LRA "in-stent restenosis." Dr Gwenlyn Found performed angiography on him February 27 2011 revealing 90% right and 70% left in-stent restenosis with 60 and 40-mm gradients, respectively. He underwent AngioSculpt cutting balloon atherectomy of both renal arteries with an excellent angiographic and followup Doppler result. In Oct 2014 he had Lt RA ISR treated with successful PTA and stenting 12/14/12. Dopplers in March were OK but dopplers done 01/19/14 showed 60-99% bilateral RA ISR.     Current Outpatient Prescriptions  Medication Sig Dispense Refill  . aspirin EC 325 MG EC tablet Take 1 tablet (325 mg total) by mouth daily.  30 tablet  0  . cholecalciferol (VITAMIN D) 1000 UNITS tablet Take 1,000 Units by mouth daily.      . clobetasol cream (TEMOVATE) [id number] % Apply 1 application topically daily as needed (Psoriasis).      . clopidogrel (PLAVIX) 75 MG tablet Take 1 tablet (75 mg total) by mouth at bedtime.  30 tablet  7  . nebivolol (BYSTOLIC) 5 MG tablet Take 1 tablet (5 mg total) by mouth daily with breakfast.  30 tablet  7  . nitroGLYCERIN (NITROSTAT) 0.4 MG SL tablet Place 0.4 mg under the tongue every 5 (five) minutes  as needed for chest pain.       . Olmesartan-Amlodipine-HCTZ (TRIBENZOR) 40-10-25 MG TABS Take 1 tablet by mouth daily with breakfast.  30 tablet  7  . simvastatin (ZOCOR) 20 MG tablet Take 1 tablet (20 mg total) by mouth at bedtime.  30 tablet  7   No current facility-administered medications for this visit.    Allergies  Allergen Reactions  . Penicillins Rash    History   Social History  . Marital Status: Married    Spouse Name: N/A    Number of Children: N/A  . Years of Education: N/A   Occupational History  . Not on file.   Social History Main Topics  . Smoking status: Former Smoker -- 2.00 packs/day for 50 years    Types: Cigarettes    Quit date: 02/24/2011  . Smokeless tobacco: Never Used  . Alcohol Use: Yes     Comment: 12/13/2012 "last alcohol was ~ 4 yr ago"  . Drug Use: No  . Sexual Activity: Not Currently   Other Topics Concern  . Not on file   Social History Narrative  . No narrative on file     Review of Systems: General: negative for chills, fever, night sweats or weight changes.  Cardiovascular: negative for chest pain, dyspnea on exertion, edema, orthopnea, palpitations, paroxysmal nocturnal dyspnea or shortness of breath Dermatological: negative for rash Respiratory: negative for cough or wheezing Urologic: negative for hematuria Abdominal: negative for nausea, vomiting, diarrhea, bright  red blood per rectum, melena, or hematemesis Neurologic: negative for visual changes, syncope, or dizziness All other systems reviewed and are otherwise negative except as noted above.    Blood pressure 152/60, pulse 64, height 5\' 11"  (1.803 m), weight 228 lb 11.2 oz (103.738 kg).  General appearance: alert, cooperative, no distress and moderately obese Neck: no carotid bruit and no JVD Lungs: clear to auscultation bilaterally Heart: regular rate and rhythm Abdomen: truncal obesity Extremities: no edema Pulses: diminnished pulses Skin: cool pale and  dry Neurologic: Grossly normal  EKG NSR, NSST changes  ASSESSMENT AND PLAN:   Renal artery stenosis- Bilat PTA 10/10, 10/12, and Lt RA PTA 8/14 Lt RA ISR suggested by recent dopplers  Chronic renal insufficiency, stage III (moderate) Last SCr was Aug 2014- 1.6  Essential hypertension Controlled  Coronary artery disease- RCA DES 11/10 No angina  Obesity BMI 32- negative sleep study in the past  Hyperlipidemia On low dose statin   PLAN  Pt was seen by Dr Gwenlyn Found and myself in the office today. He will need to be set up for an OP PVA. He may require overnight admission secondary pending his labs to renal insufficiency.   Avagrace Botelho KPA-C 01/31/2014 11:46 AM

## 2014-02-17 ENCOUNTER — Telehealth (HOSPITAL_COMMUNITY): Payer: Self-pay | Admitting: *Deleted

## 2014-02-17 ENCOUNTER — Other Ambulatory Visit: Payer: Self-pay | Admitting: Physician Assistant

## 2014-02-17 ENCOUNTER — Encounter (HOSPITAL_COMMUNITY): Payer: Self-pay | Admitting: Physician Assistant

## 2014-02-17 DIAGNOSIS — I10 Essential (primary) hypertension: Secondary | ICD-10-CM | POA: Diagnosis not present

## 2014-02-17 DIAGNOSIS — I701 Atherosclerosis of renal artery: Secondary | ICD-10-CM | POA: Diagnosis not present

## 2014-02-17 LAB — CBC
HEMATOCRIT: 36.2 % — AB (ref 39.0–52.0)
HEMOGLOBIN: 12.2 g/dL — AB (ref 13.0–17.0)
MCH: 29.2 pg (ref 26.0–34.0)
MCHC: 33.7 g/dL (ref 30.0–36.0)
MCV: 86.6 fL (ref 78.0–100.0)
Platelets: 185 10*3/uL (ref 150–400)
RBC: 4.18 MIL/uL — AB (ref 4.22–5.81)
RDW: 13.7 % (ref 11.5–15.5)
WBC: 9.2 10*3/uL (ref 4.0–10.5)

## 2014-02-17 LAB — BASIC METABOLIC PANEL
ANION GAP: 14 (ref 5–15)
BUN: 26 mg/dL — ABNORMAL HIGH (ref 6–23)
CHLORIDE: 106 meq/L (ref 96–112)
CO2: 21 meq/L (ref 19–32)
Calcium: 8.5 mg/dL (ref 8.4–10.5)
Creatinine, Ser: 1.27 mg/dL (ref 0.50–1.35)
GFR calc Af Amer: 65 mL/min — ABNORMAL LOW (ref >90)
GFR calc non Af Amer: 56 mL/min — ABNORMAL LOW (ref >90)
GLUCOSE: 95 mg/dL (ref 70–99)
POTASSIUM: 3.9 meq/L (ref 3.7–5.3)
SODIUM: 141 meq/L (ref 137–147)

## 2014-02-17 NOTE — Discharge Summary (Signed)
CARDIOLOGY DISCHARGE SUMMARY   Patient ID: Steven Scott MRN: [id number] DOB/AGE: 69/20/1947 69 y.o.  Admit date: 02/15/2014 Discharge date: 02/17/2014  PCP: Rocky Morel, MD Primary Cardiologist: Dr. Gwenlyn Found  Primary Discharge Diagnosis:  Renal artery stenosis- Bilat PTA 10/10, 10/12, Lt RA PTA 8/14, L RA ICast stent 02/2014  Secondary Discharge Diagnosis:    Essential hypertension   Chronic renal insufficiency, stage III (moderate)   RAS (renal artery stenosis)  Procedures: Selective right and left renal artery angiography, successful restenting of the left renal artery with an ICast covered stent and cutting balloon atherectomy of in-stent restenosis at the origin and proximal portion of the right renal artery stent.   Hospital Course: Steven Scott is a 68 y.o. male with a history of CAD and PVD. He had Dopplers suggesting in-stent restenosis and was seen by Dr. Gwenlyn Found the office. PV catheterization with possible stenting was scheduled and he came to the hospital for the procedure on 10/08.  Procedure results are below. He had successful stenting of the left renal artery for in-stent restenosis and atherectomy of the right renal artery. He tolerated the procedure well.  On 10/09, he was seen by Dr. Angelena Form and all data were reviewed. His renal function is stable post-procedure. His Tribenzor had been held for the procedure, but his blood pressure is significantly elevated and he will restart this today.  Mr. Litherland was ambulating without chest pain or shortness of breath and his cath site was stable. He is considered stable for discharge, to follow up as an outpatient.   Labs:   Lab Results  Component Value Date   WBC 9.2 02/17/2014   HGB 12.2* 02/17/2014   HCT 36.2* 02/17/2014   MCV 86.6 02/17/2014   PLT 185 02/17/2014    Recent Labs Lab 02/13/14 1110  02/17/14 0410  NA 139  < > 141  K 4.2  < > 3.9  CL 105  < > 106  CO2 27  < > 21  BUN 37*  < > 26*    CREATININE 1.80*  < > 1.27  CALCIUM 9.4  < > 8.5  PROT 6.9  --   --   BILITOT 0.4  --   --   ALKPHOS 70  --   --   ALT 9  --   --   AST 13  --   --   GLUCOSE 94  < > 95  < > = values in this interval not displayed.   Recent Labs  02/16/14 0427  INR 1.18      Radiology: Dg Chest 2 View  02/13/2014   CLINICAL DATA:  Preop  for renal artery stenosis stent  EXAM: CHEST  2 VIEW  COMPARISON:  12/09/2012  FINDINGS: Cardiomediastinal silhouette is stable. No acute infiltrate or pleural effusion. No pulmonary edema. Degenerative changes thoracic spine.  IMPRESSION: No active cardiopulmonary disease.   Electronically Signed   By: Lahoma Crocker M.D.   On: 02/13/2014 10:59   PV Cath: 02/16/2014 PV angio and stent: 02/16/2014  Angiographic Data:  1: Left renal artery-75% hypodense lesion in the distal aspect of the previously placed left renal artery stent extending just beyond the stented segment  2: Right renal artery-85-90% ostial/proximal right renal artery stenosis  IMPRESSION:Mr. Steven Scott has high-grade bilateral "in-stent restenosis of his previously placed renal stents with increasing creatinine and elevated velocities. We'll proceed with re\re intervention using an ICast covered stent on the left than cutting balloon  angioplasty on the right  Final Impression: successful restenting of the left renal artery with an ICast covered stent and cutting balloon atherectomy of in-stent restenosis at the origin and proximal portion of the right renal artery stent. The patient tolerated the procedure well. The sheath will be removed once the ACT falls below 170 and pressure will be held. He will be hydrated overnight. He is already on dual antiplatelet therapy.the patient left the lab in stable condition. He will be hydrated overnight, discharged home in the morning and will obtain renal Doppler studies in her [facility] office in one week followed by a return office visit with the mid-level provider in 2-3  weeks on that date I am in the office.  FOLLOW UP PLANS AND APPOINTMENTS Allergies  Allergen Reactions  . Penicillins Rash     Medication List         acetaminophen 500 MG tablet  Commonly known as:  TYLENOL  Take 500 mg by mouth every 6 (six) hours as needed for moderate pain.     aspirin 325 MG EC tablet  Take 1 tablet (325 mg total) by mouth daily.     cholecalciferol 1000 UNITS tablet  Commonly known as:  VITAMIN D  Take 1,000 Units by mouth daily.     clobetasol cream 0.05 %  Commonly known as:  TEMOVATE  Apply 1 application topically daily as needed (Psoriasis).     clopidogrel 75 MG tablet  Commonly known as:  PLAVIX  Take 1 tablet (75 mg total) by mouth at bedtime.     nebivolol 5 MG tablet  Commonly known as:  BYSTOLIC  Take 1 tablet (5 mg total) by mouth daily with breakfast.     nitroGLYCERIN 0.4 MG SL tablet  Commonly known as:  NITROSTAT  Place 0.4 mg under the tongue every 5 (five) minutes as needed for chest pain.     Olmesartan-Amlodipine-HCTZ 40-10-25 MG Tabs  Commonly known as:  TRIBENZOR  Take 1 tablet by mouth daily with breakfast.     simvastatin 20 MG tablet  Commonly known as:  ZOCOR  Take 1 tablet (20 mg total) by mouth at bedtime.        Discharge Instructions   Diet - low sodium heart healthy    Complete by:  As directed      Increase activity slowly    Complete by:  As directed           Follow-up Information   Follow up with [name], MD. (Followup ultrasound studies and an office visit, the office will call with date and time.)    Specialty:  Cardiology   Contact information:   [address] [place] [address] [phone]       BRING ALL MEDICATIONS WITH YOU TO FOLLOW UP APPOINTMENTS  Time spent with patient to include physician time: 46 min Signed: Rosaria Ferries, PA-C 02/17/2014, 8:40 AM Co-Sign MD

## 2014-02-17 NOTE — Progress Notes (Signed)
Steven Scott Name: Steven Scott Date of Encounter: 02/17/2014  Principal Problem:   Renal artery stenosis- Bilat PTA 10/10, 10/12, and Lt RA PTA 8/14 Active Problems:   Essential hypertension   Chronic renal insufficiency, stage III (moderate)   RAS (renal artery stenosis)    Steven Scott Profile: 68 yo male w/ hx HTN, bilat RA stents 2009, DES RCA, admitted 10/07 for hydration prior to undergoing PV angiogram and stenting on 10/08.  SUBJECTIVE: No chest pain or SOB. Cath site OK.   OBJECTIVE Filed Vitals:   02/17/14 0004 02/17/14 0413 02/17/14 0416 02/17/14 0518  BP: 143/92 196/59 187/52 159/48  Pulse: 64 62    Temp: 97.8 F (36.6 C) 98 F (36.7 C)    TempSrc: Oral Oral    Resp: 18 20    Height:      Weight: 231 lb 7.7 oz (105 kg)     SpO2: 97% 97%      Intake/Output Summary (Last 24 hours) at 02/17/14 0718 Last data filed at 02/17/14 0400  Gross per 24 hour  Intake    850 ml  Output   1450 ml  Net   -600 ml   Filed Weights   02/15/14 1628 02/16/14 0403 02/17/14 0004  Weight: 230 lb 8 oz (104.554 kg) 228 lb 4.8 oz (103.556 kg) 231 lb 7.7 oz (105 kg)    PHYSICAL EXAM General: Well developed, well nourished, male in no acute distress. Head: Normocephalic, atraumatic.  Neck: Supple without bruits, JVD not elevated. Lungs:  Resp regular and unlabored, CTA. Heart: RRR, S1, S2, no S3, S4, or murmur; no rub. Abdomen: Soft, non-tender, non-distended, BS + x 4.  Extremities: No clubbing, cyanosis, edema. Bilateral fem bruits, no hematoma, ecchymosis right groin. Distal pulses intact Neuro: Alert and oriented X 3. Moves all extremities spontaneously. Psych: Normal affect.  LABS: CBC:  Recent Labs  02/16/14 0427 02/17/14 0410  WBC 7.9 9.2  HGB 13.4 12.2*  HCT 40.2 36.2*  MCV 87.6 86.6  PLT 187 185   INR:  Recent Labs  02/16/14 0427  INR [id number]   Basic Metabolic Panel:  Recent Labs  02/16/14 0427 02/17/14 0410  NA 142 141  K 4.6 3.9  CL 107 106    CO2 25 21  GLUCOSE 100* 95  BUN 27* 26*  CREATININE 1.62* 1.27  CALCIUM 9.0 8.5   TELE:   SR     PV angio and stent: 02/16/2014 Angiographic Data:  1: Left renal artery-75% hypodense lesion in the distal aspect of the previously placed left renal artery stent extending just beyond the stented segment  2: Right renal artery-85-90% ostial/proximal right renal artery stenosis  IMPRESSION:Steven Scott has high-grade bilateral "in-stent restenosis of his previously placed renal stents with increasing creatinine and elevated velocities. We'll proceed with re\re intervention using an ICast covered stent on the left than cutting balloon angioplasty on the right  Final Impression: successful restenting of the left renal artery with an ICast covered stent and cutting balloon atherectomy of in-stent restenosis at the origin and proximal portion of the right renal artery stent. The Steven Scott tolerated the procedure well. The sheath will be removed once the ACT falls below 170 and pressure will be held. Steven Scott will be hydrated overnight. Steven Scott is already on dual antiplatelet therapy.the Steven Scott left the lab in stable condition. Steven Scott will be hydrated overnight, discharged home in the morning and will obtain renal Doppler studies in her [facility] office in one week followed by  a return office visit with the mid-level provider in 2-3 weeks on that date I am in the office.  Current Medications:  . amLODipine  10 mg Oral Daily  . aspirin EC  325 mg Oral Daily  . nebivolol  5 mg Oral Q breakfast  . simvastatin  20 mg Oral QHS    Plavix 75 mg daily  ASSESSMENT AND PLAN: Principal Problem:   Renal artery stenosis- Bilat PTA 10/10, 10/12, and Lt RA PTA 8/14 - successful stenting of the L RA for ISR  Active Problems:   Chronic renal insufficiency, stage III (moderate) - BUN/Cr OK after cath.    RAS (renal artery stenosis) - see above.    HTN - on home nevibolol, also on Tribenzor 40/10/25. MD advise when OK to  restart.  Plan - d/c home today.  Signed, Rosaria Ferries , PA-C 7:18 AM 02/17/2014  I have personally seen and examined this Steven Scott with Rosaria Ferries, PA-C. I agree with the assessment and plan as outlined above. Steven Scott is s/p stenting of the left renal artery and cutting balloon angioplasty of the ISR in the right renal artery. Doing well this am. Labs reviewed. Vitals reviewed. I agree with the PE findings as above. Discharge home today. F/U renal artery dopplers in one week and f/u with Dr. Gwenlyn Found or PA/NP in 2-3 weeks. Restart home BP meds. Continue ASA and Plavix.   MCALHANY,CHRISTOPHER 02/17/2014 7:26 AM

## 2014-02-17 NOTE — Discharge Summary (Signed)
See full note this am. cdm 

## 2014-02-17 NOTE — Discharge Instructions (Signed)
PLEASE REMEMBER TO BRING ALL OF YOUR MEDICATIONS TO EACH OF YOUR FOLLOW-UP OFFICE VISITS. ° °PLEASE ATTEND ALL SCHEDULED FOLLOW-UP APPOINTMENTS.  ° °Activity: Increase activity slowly as tolerated. You may shower, but no soaking baths (or swimming) for 1 week. No driving for 2 days. No lifting over 5 lbs for 1 week. No sexual activity for 1 week.  ° °You May Return to Work: in 1 week (if applicable) ° °Wound Care: You may wash cath site gently with soap and water. Keep cath site clean and dry. If you notice pain, swelling, bleeding or pus at your cath site, please call [phone]. ° ° ° °Cardiac Cath Site Care °Refer to this sheet in the next few weeks. These instructions provide you with information on caring for yourself after your procedure. Your caregiver may also give you more specific instructions. Your treatment has been planned according to current medical practices, but problems sometimes occur. Call your caregiver if you have any problems or questions after your procedure. °HOME CARE INSTRUCTIONS °· You may shower 24 hours after the procedure. Remove the bandage (dressing) and gently wash the site with plain soap and water. Gently pat the site dry.  °· Do not apply powder or lotion to the site.  °· Do not sit in a bathtub, swimming pool, or whirlpool for 5 to 7 days.  °· No bending, squatting, or lifting anything over 10 pounds (4.5 kg) as directed by your caregiver.  °· Inspect the site at least twice daily.  °· Do not drive home if you are discharged the same day of the procedure. Have someone else drive you.  °· You may drive 24 hours after the procedure unless otherwise instructed by your caregiver.  °What to expect: °· Any bruising will usually fade within 1 to 2 weeks.  °· Blood that collects in the tissue (hematoma) may be painful to the touch. It should usually decrease in size and tenderness within 1 to 2 weeks.  °SEEK IMMEDIATE MEDICAL CARE IF: °· You have unusual pain at the site or down the  affected limb.  °· You have redness, warmth, swelling, or pain at the site.  °· You have drainage (other than a small amount of blood on the dressing).  °· You have chills.  °· You have a fever or persistent symptoms for more than 72 hours.  °· You have a fever and your symptoms suddenly get worse.  °· Your leg becomes pale, cool, tingly, or numb.  °· You have heavy bleeding from the site. Hold pressure on the site.  °Document Released: 05/31/2010 Document Revised: 04/17/2011 Document Reviewed: 05/31/2010 °ExitCare® Patient Information ©2012 [employer]. ° °

## 2014-02-20 ENCOUNTER — Encounter (HOSPITAL_COMMUNITY): Payer: Self-pay | Admitting: *Deleted

## 2014-02-23 ENCOUNTER — Telehealth: Payer: Self-pay | Admitting: Cardiovascular Disease

## 2014-02-23 NOTE — Telephone Encounter (Signed)
Closed encounter °

## 2014-03-02 ENCOUNTER — Ambulatory Visit (HOSPITAL_COMMUNITY)
Admission: RE | Admit: 2014-03-02 | Discharge: 2014-03-02 | Disposition: A | Discharge status: Home / Self Care | Payer: Medicare Other | Source: Ambulatory Visit | Attending: Cardiology | Admitting: Cardiology

## 2014-03-02 DIAGNOSIS — I701 Atherosclerosis of renal artery: Secondary | ICD-10-CM | POA: Diagnosis not present

## 2014-03-02 NOTE — Progress Notes (Signed)
Renal Duplex Completed for s/p bilateral PTA/Stenting. Oda Cogan, BS, RDMS, RVT

## 2014-03-03 DIAGNOSIS — Z6831 Body mass index (BMI) 31.0-31.9, adult: Secondary | ICD-10-CM | POA: Diagnosis not present

## 2014-03-03 DIAGNOSIS — Z0000 Encounter for general adult medical examination without abnormal findings: Secondary | ICD-10-CM | POA: Diagnosis not present

## 2014-03-03 DIAGNOSIS — H6123 Impacted cerumen, bilateral: Secondary | ICD-10-CM | POA: Diagnosis not present

## 2014-03-03 DIAGNOSIS — E782 Mixed hyperlipidemia: Secondary | ICD-10-CM | POA: Diagnosis not present

## 2014-03-03 DIAGNOSIS — I10 Essential (primary) hypertension: Secondary | ICD-10-CM | POA: Diagnosis not present

## 2014-03-07 DIAGNOSIS — R972 Elevated prostate specific antigen [PSA]: Secondary | ICD-10-CM | POA: Diagnosis not present

## 2014-03-07 DIAGNOSIS — Z0000 Encounter for general adult medical examination without abnormal findings: Secondary | ICD-10-CM | POA: Diagnosis not present

## 2014-03-07 DIAGNOSIS — E6609 Other obesity due to excess calories: Secondary | ICD-10-CM | POA: Diagnosis not present

## 2014-03-07 DIAGNOSIS — Z6831 Body mass index (BMI) 31.0-31.9, adult: Secondary | ICD-10-CM | POA: Diagnosis not present

## 2014-03-14 ENCOUNTER — Ambulatory Visit (INDEPENDENT_AMBULATORY_CARE_PROVIDER_SITE_OTHER): Payer: Medicare Other | Admitting: Cardiology

## 2014-03-14 ENCOUNTER — Encounter: Payer: Self-pay | Admitting: Cardiology

## 2014-03-14 VITALS — BP 168/70 | HR 60 | Ht 71.0 in | Wt 231.5 lb

## 2014-03-14 DIAGNOSIS — I701 Atherosclerosis of renal artery: Secondary | ICD-10-CM | POA: Diagnosis not present

## 2014-03-14 DIAGNOSIS — I2510 Atherosclerotic heart disease of native coronary artery without angina pectoris: Secondary | ICD-10-CM

## 2014-03-14 DIAGNOSIS — N189 Chronic kidney disease, unspecified: Secondary | ICD-10-CM

## 2014-03-14 DIAGNOSIS — I10 Essential (primary) hypertension: Secondary | ICD-10-CM | POA: Diagnosis not present

## 2014-03-14 DIAGNOSIS — E785 Hyperlipidemia, unspecified: Secondary | ICD-10-CM

## 2014-03-14 DIAGNOSIS — N183 Chronic kidney disease, stage 3 unspecified: Secondary | ICD-10-CM

## 2014-03-14 DIAGNOSIS — E669 Obesity, unspecified: Secondary | ICD-10-CM | POA: Diagnosis not present

## 2014-03-14 NOTE — Assessment & Plan Note (Signed)
No angina 

## 2014-03-14 NOTE — Assessment & Plan Note (Signed)
Negative sleep study one year ago

## 2014-03-14 NOTE — Patient Instructions (Signed)
Your physician recommends that you schedule a follow-up appointment in: 6 Months with Dr Berry    

## 2014-03-14 NOTE — Assessment & Plan Note (Signed)
Bilat RA PTA '09, Lt RA ISR- PTA Aug 2014, Bilateral RA PTA 02/16/14. F/U doppler document patency.

## 2014-03-14 NOTE — Assessment & Plan Note (Signed)
SCr 1.27 at discharge

## 2014-03-14 NOTE — Assessment & Plan Note (Signed)
Controlled.  

## 2014-03-14 NOTE — Assessment & Plan Note (Signed)
On statin.

## 2014-03-14 NOTE — Progress Notes (Signed)
03/14/2014 Steven Scott   24-Jun-1945  SN:[id number]  Primary Physicia Rocky Morel, MD Primary Cardiologist: Dr Gwenlyn Found  HPI:  Pt is a pleasant 68 year old, moderately overweight, married Caucasian male, father of 34, from [place] who is followed by Dr Gwenlyn Found. He has a history of persistent hypertension and RAS. He is status post bilateral renal artery PTA and stenting in 2009. At that time he also had a positive Myoview. Heart catheterization then that showed a high-grade distal RCA stenosis. Dr. Claiborne Billings subsequently intervned successfully on March 28, 2008, with a Xience V drug-eluting stent with an excellent result. Myoview 2010 was low risk and he has not had recurrent angina.  He had Lt RA ISR Rx'd with PTA Aug 2014. Dopplers in Sept 2015 suggested ISR and he had bilateral RA PTA with a Lt RAS DES, and Rt RA cutting balloon 02/16/14. Follow up dopplers showed patent renal arteries. He is doing well since.    Current Outpatient Prescriptions  Medication Sig Dispense Refill  . acetaminophen (TYLENOL) 500 MG tablet Take 500 mg by mouth every 6 (six) hours as needed for moderate pain.    Marland Kitchen aspirin EC 325 MG EC tablet Take 1 tablet (325 mg total) by mouth daily. 30 tablet 0  . cholecalciferol (VITAMIN D) 1000 UNITS tablet Take 1,000 Units by mouth daily.    . clobetasol cream (TEMOVATE) [id number] % Apply 1 application topically daily as needed (Psoriasis).    . clopidogrel (PLAVIX) 75 MG tablet Take 1 tablet (75 mg total) by mouth at bedtime. 30 tablet 7  . nebivolol (BYSTOLIC) 5 MG tablet Take 1 tablet (5 mg total) by mouth daily with breakfast. 30 tablet 7  . nitroGLYCERIN (NITROSTAT) 0.4 MG SL tablet Place 0.4 mg under the tongue every 5 (five) minutes as needed for chest pain.     . Olmesartan-Amlodipine-HCTZ (TRIBENZOR) 40-10-25 MG TABS Take 1 tablet by mouth daily with breakfast. 30 tablet 7  . simvastatin (ZOCOR) 20 MG tablet Take 1 tablet (20 mg total) by mouth at bedtime.  30 tablet 7   No current facility-administered medications for this visit.    Allergies  Allergen Reactions  . Penicillins Rash    History   Social History  . Marital Status: Married    Spouse Name: N/A    Number of Children: N/A  . Years of Education: N/A   Occupational History  . Not on file.   Social History Main Topics  . Smoking status: Former Smoker -- 2.00 packs/day for 50 years    Types: Cigarettes    Quit date: 02/24/2011  . Smokeless tobacco: Never Used  . Alcohol Use: No     Comment: 12/13/2012 "last alcohol was ~ 4 yr ago"  . Drug Use: No  . Sexual Activity: Not Currently   Other Topics Concern  . Not on file   Social History Narrative     Review of Systems: Negative sleep study one year ago Recent PSA elevated - he is to have Urology follow up General: negative for chills, fever, night sweats or weight changes.  Cardiovascular: negative for chest pain, dyspnea on exertion, edema, orthopnea, palpitations, paroxysmal nocturnal dyspnea or shortness of breath Dermatological: negative for rash Respiratory: negative for cough or wheezing Urologic: negative for hematuria Abdominal: negative for nausea, vomiting, diarrhea, bright red blood per rectum, melena, or hematemesis Neurologic: negative for visual changes, syncope, or dizziness All other systems reviewed and are otherwise negative except as noted above.  Blood pressure 168/70, pulse 60, height 5\' 11"  (1.803 m), weight 231 lb 8 oz (105.008 kg).  General appearance: alert, cooperative and no distress Lungs: clear to auscultation bilaterally Heart: regular rate and rhythm  EKG- NSR, Q in V2, NSST changes  ASSESSMENT AND PLAN:   Renal artery stenosis Bilat RA PTA '09, Lt RA ISR- PTA Aug 2014, Bilateral RA PTA 02/16/14. F/U doppler document patency.  Chronic renal insufficiency, stage III (moderate) SCr 1.27 at discharge  Essential hypertension Controlled  Coronary artery disease- RCA DES  11/10 No angina  Hyperlipidemia On statin  Obesity Negative sleep study one year ago   PLAN  Same Rx. F/U Dr Gwenlyn Found in 6 months.   Amit Leece KPA-C 03/14/2014 10:54 AM

## 2014-03-16 ENCOUNTER — Encounter: Payer: Self-pay | Admitting: *Deleted

## 2014-03-20 DIAGNOSIS — Z79899 Other long term (current) drug therapy: Secondary | ICD-10-CM | POA: Diagnosis not present

## 2014-03-20 DIAGNOSIS — R809 Proteinuria, unspecified: Secondary | ICD-10-CM | POA: Diagnosis not present

## 2014-03-20 DIAGNOSIS — N183 Chronic kidney disease, stage 3 (moderate): Secondary | ICD-10-CM | POA: Diagnosis not present

## 2014-03-20 DIAGNOSIS — E559 Vitamin D deficiency, unspecified: Secondary | ICD-10-CM | POA: Diagnosis not present

## 2014-03-20 DIAGNOSIS — I10 Essential (primary) hypertension: Secondary | ICD-10-CM | POA: Diagnosis not present

## 2014-03-20 DIAGNOSIS — D649 Anemia, unspecified: Secondary | ICD-10-CM | POA: Diagnosis not present

## 2014-03-22 DIAGNOSIS — E559 Vitamin D deficiency, unspecified: Secondary | ICD-10-CM | POA: Diagnosis not present

## 2014-03-22 DIAGNOSIS — E669 Obesity, unspecified: Secondary | ICD-10-CM | POA: Diagnosis not present

## 2014-03-22 DIAGNOSIS — N183 Chronic kidney disease, stage 3 (moderate): Secondary | ICD-10-CM | POA: Diagnosis not present

## 2014-03-22 DIAGNOSIS — I10 Essential (primary) hypertension: Secondary | ICD-10-CM | POA: Diagnosis not present

## 2014-04-20 ENCOUNTER — Encounter (HOSPITAL_COMMUNITY): Payer: Self-pay | Admitting: Cardiovascular Disease

## 2014-06-13 ENCOUNTER — Ambulatory Visit (INDEPENDENT_AMBULATORY_CARE_PROVIDER_SITE_OTHER): Payer: Medicare Other | Admitting: Urology

## 2014-06-13 DIAGNOSIS — N401 Enlarged prostate with lower urinary tract symptoms: Secondary | ICD-10-CM

## 2014-06-13 DIAGNOSIS — N529 Male erectile dysfunction, unspecified: Secondary | ICD-10-CM | POA: Diagnosis not present

## 2014-06-13 DIAGNOSIS — R351 Nocturia: Secondary | ICD-10-CM

## 2014-06-13 DIAGNOSIS — R972 Elevated prostate specific antigen [PSA]: Secondary | ICD-10-CM | POA: Diagnosis not present

## 2014-07-31 ENCOUNTER — Encounter (HOSPITAL_COMMUNITY): Payer: Self-pay | Admitting: *Deleted

## 2014-07-31 ENCOUNTER — Other Ambulatory Visit (HOSPITAL_COMMUNITY): Payer: Self-pay | Admitting: Cardiovascular Disease

## 2014-07-31 DIAGNOSIS — I129 Hypertensive chronic kidney disease with stage 1 through stage 4 chronic kidney disease, or unspecified chronic kidney disease: Secondary | ICD-10-CM | POA: Diagnosis not present

## 2014-07-31 DIAGNOSIS — I701 Atherosclerosis of renal artery: Secondary | ICD-10-CM

## 2014-07-31 DIAGNOSIS — E559 Vitamin D deficiency, unspecified: Secondary | ICD-10-CM | POA: Diagnosis not present

## 2014-07-31 DIAGNOSIS — N183 Chronic kidney disease, stage 3 (moderate): Secondary | ICD-10-CM | POA: Diagnosis not present

## 2014-07-31 DIAGNOSIS — Z79899 Other long term (current) drug therapy: Secondary | ICD-10-CM | POA: Diagnosis not present

## 2014-07-31 DIAGNOSIS — R809 Proteinuria, unspecified: Secondary | ICD-10-CM | POA: Diagnosis not present

## 2014-07-31 DIAGNOSIS — D649 Anemia, unspecified: Secondary | ICD-10-CM | POA: Diagnosis not present

## 2014-08-02 DIAGNOSIS — N183 Chronic kidney disease, stage 3 (moderate): Secondary | ICD-10-CM | POA: Diagnosis not present

## 2014-08-02 DIAGNOSIS — R809 Proteinuria, unspecified: Secondary | ICD-10-CM | POA: Diagnosis not present

## 2014-08-02 DIAGNOSIS — I10 Essential (primary) hypertension: Secondary | ICD-10-CM | POA: Diagnosis not present

## 2014-08-24 ENCOUNTER — Encounter (HOSPITAL_COMMUNITY): Payer: Self-pay | Admitting: *Deleted

## 2014-08-29 ENCOUNTER — Ambulatory Visit (HOSPITAL_COMMUNITY)
Admission: RE | Admit: 2014-08-29 | Discharge: 2014-08-29 | Disposition: A | Discharge status: Home / Self Care | Payer: Medicare Other | Source: Ambulatory Visit | Attending: Cardiovascular Disease | Admitting: Cardiovascular Disease

## 2014-08-29 DIAGNOSIS — I701 Atherosclerosis of renal artery: Secondary | ICD-10-CM | POA: Diagnosis not present

## 2014-08-29 NOTE — Progress Notes (Signed)
Renal Duplex Completed. Heiley Shaikh, BS, RDMS, RVT  

## 2014-08-31 ENCOUNTER — Telehealth: Payer: Self-pay | Admitting: *Deleted

## 2014-08-31 ENCOUNTER — Encounter: Payer: Self-pay | Admitting: *Deleted

## 2014-08-31 DIAGNOSIS — I701 Atherosclerosis of renal artery: Secondary | ICD-10-CM

## 2014-08-31 NOTE — Telephone Encounter (Signed)
Order placed for repeat renal doppler in 6 months

## 2014-08-31 NOTE — Telephone Encounter (Signed)
-----   Message from [name], MD sent at 08/31/2014  5:08 PM EDT ----- No change from prior study. Repeat in 6 months

## 2014-09-06 IMAGING — US US RENAL
1 series · 14 of 23 positions shown · non-contrast
Comparison: 01/27/2006

CLINICAL DATA: Chronic renal failure

RENAL/URINARY TRACT ULTRASOUND COMPLETE

[Series 1: us renal · 0.25mm/px · 14 of 23 slices shown]
[im 1/23]
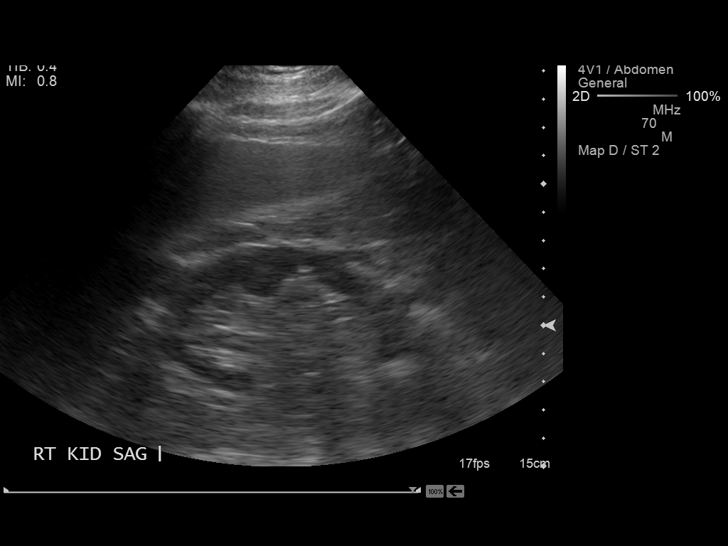
[im 3/23]
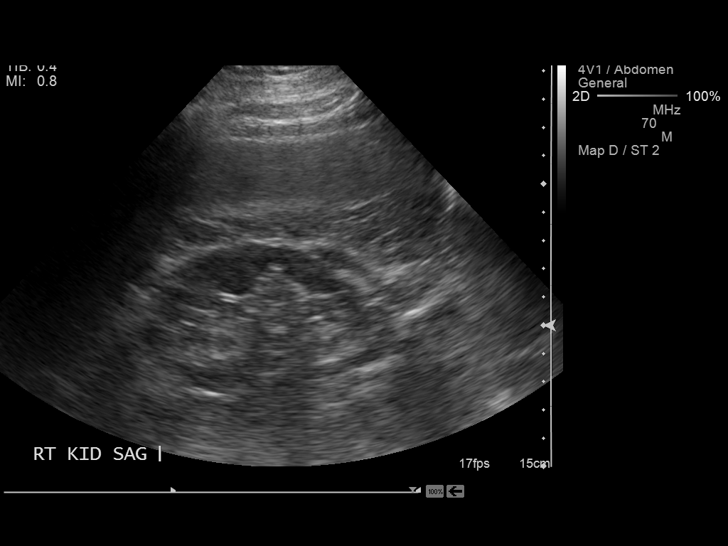
[im 5/23]
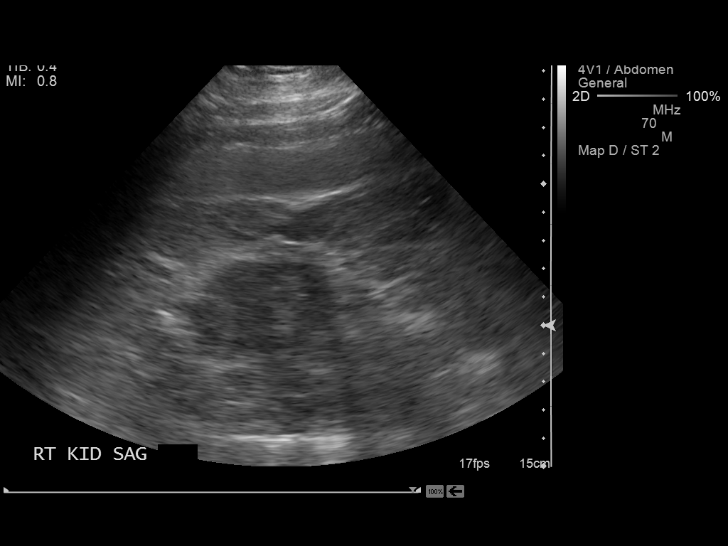
[im 6/23]
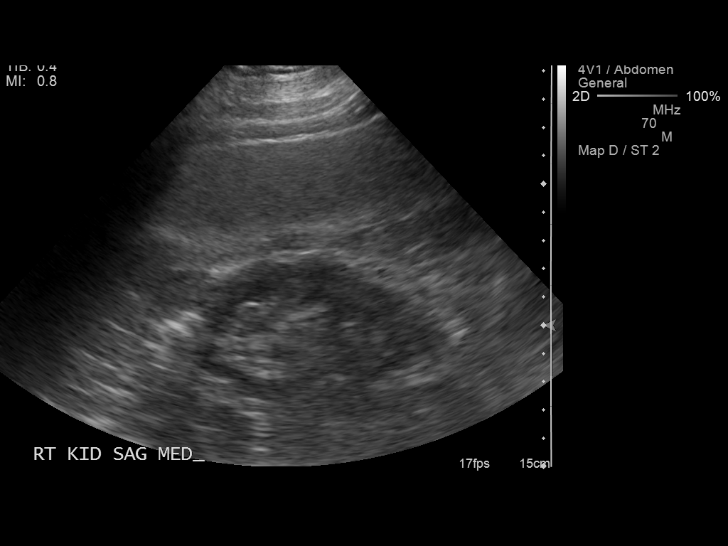
[im 8/23]
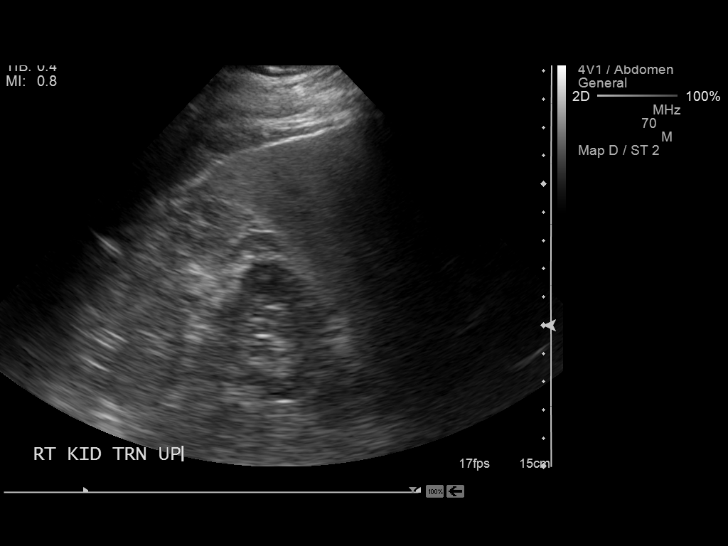
[im 10/23]
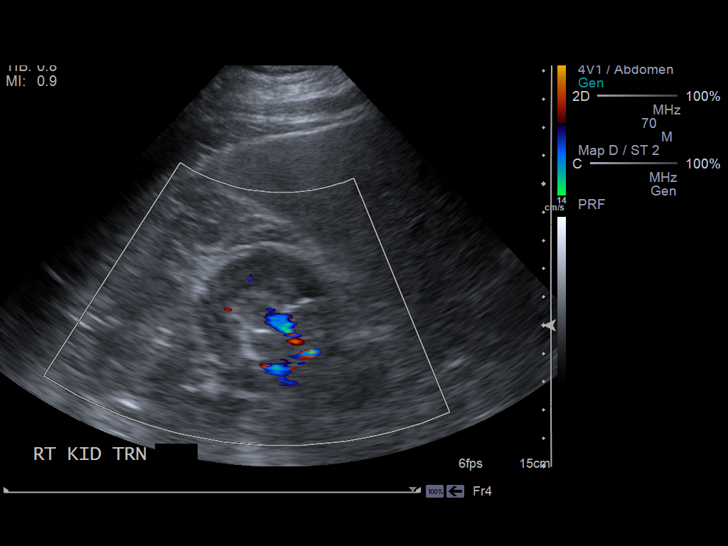
[im 11/23]
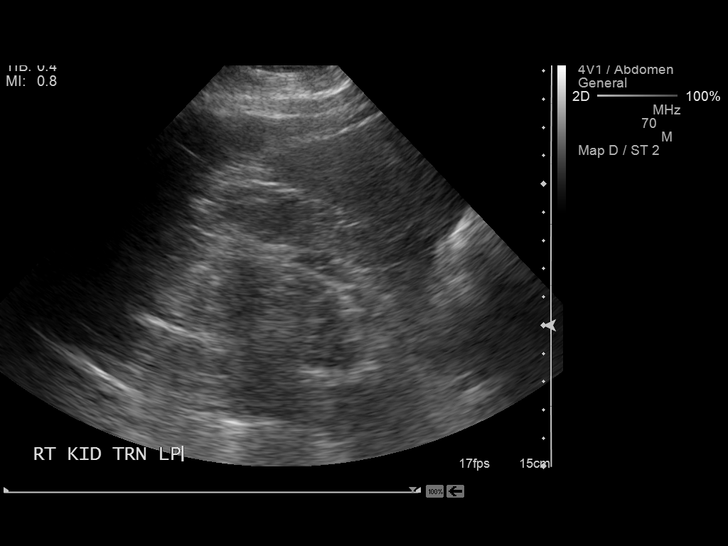
[im 13/23]
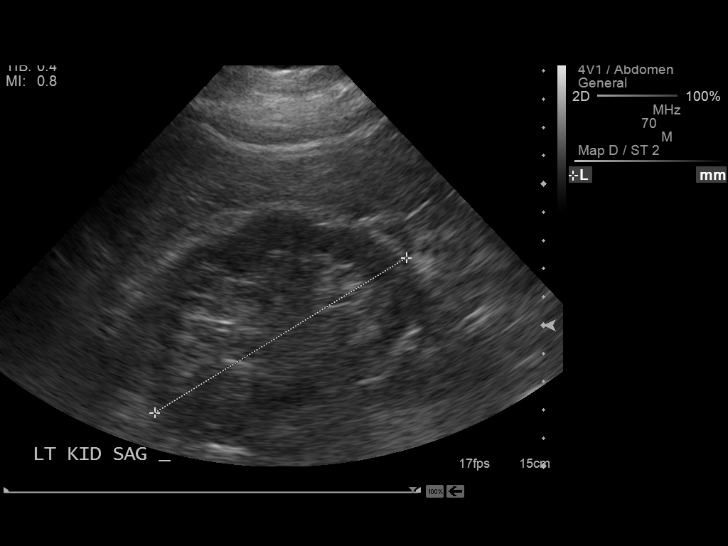
[im 14/23]
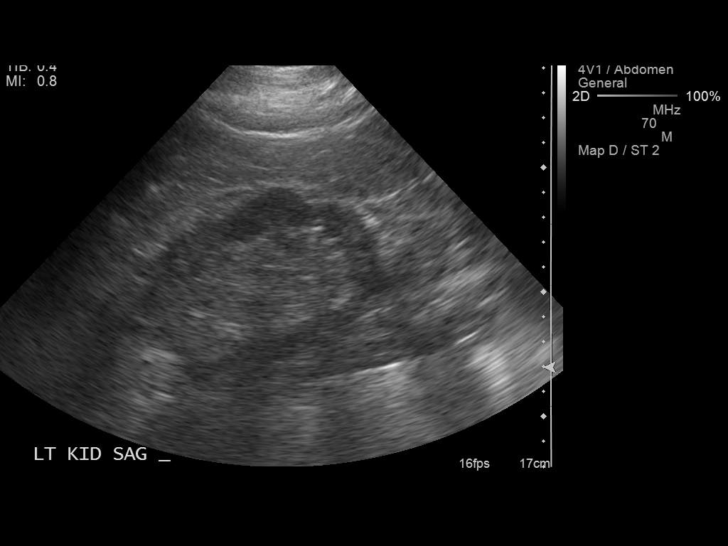
[im 16/23]
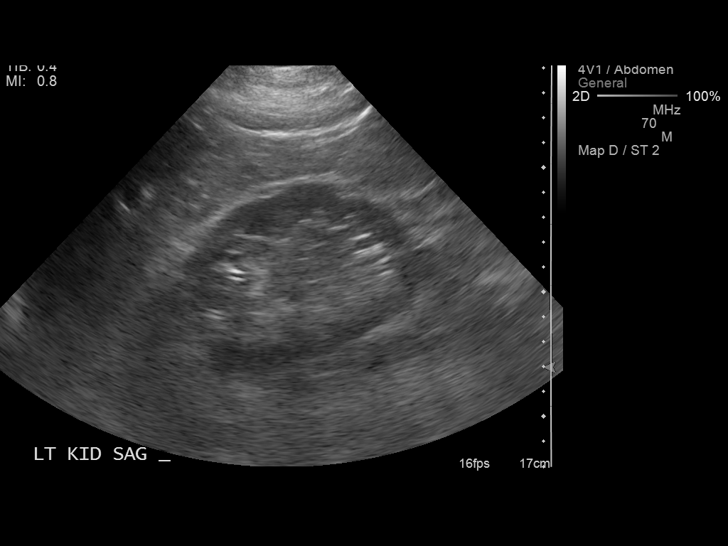
[im 18/23]
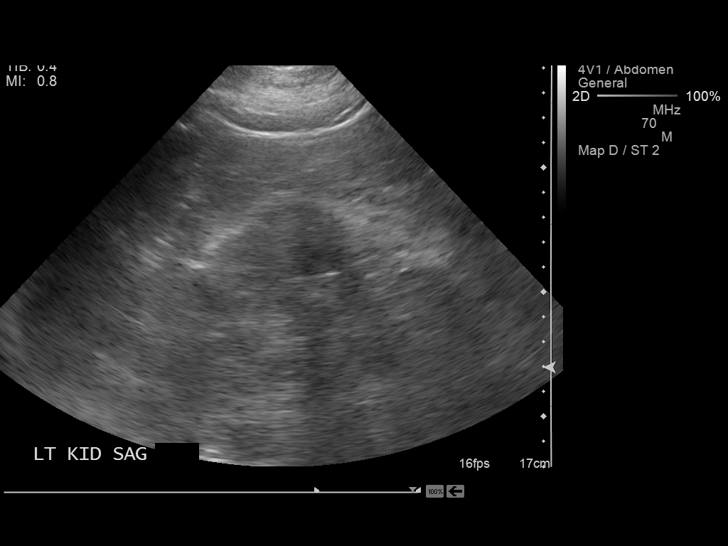
[im 19/23]
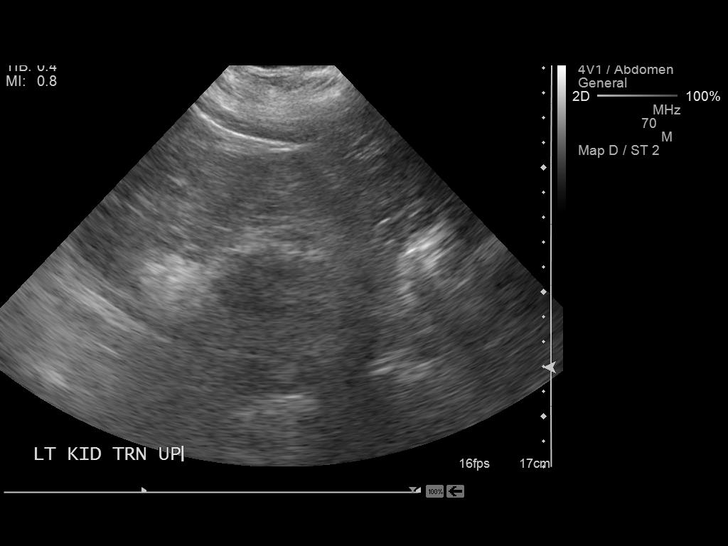
[im 21/23]
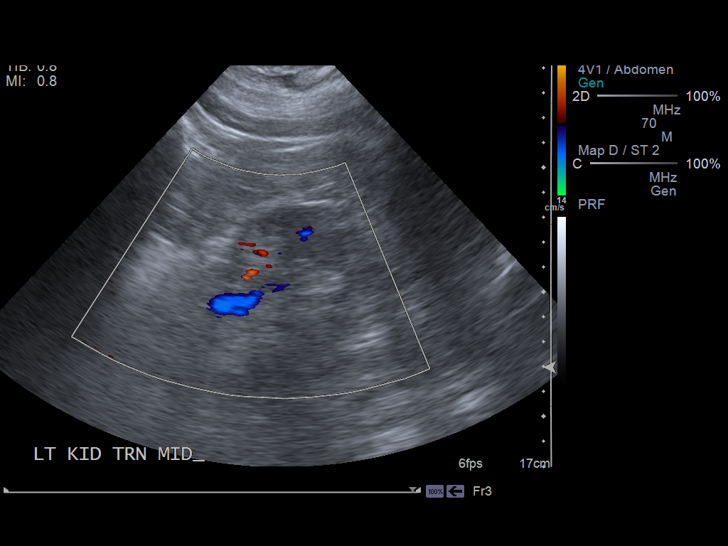
[im 23/23]
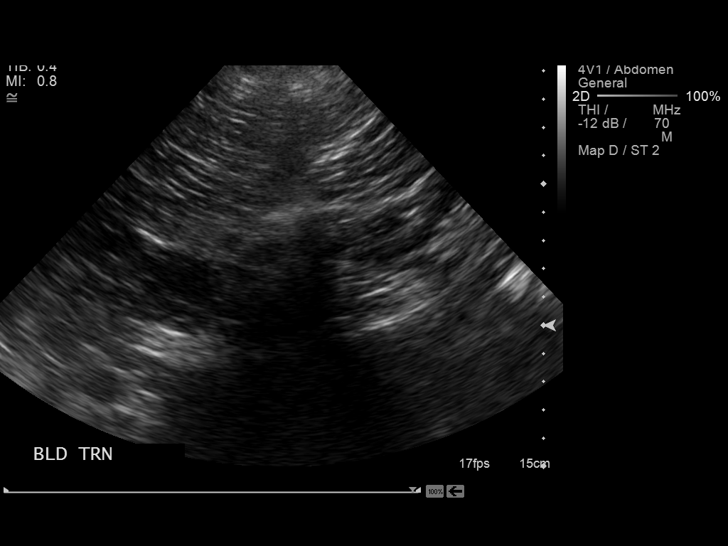

[14 of 23 positions shown; findings below may reference images not displayed]

FINDINGS: Incidental note is made of diffusely increased
echogenicity throughout the liver.

Right Kidney:  9.5 cm in length.  No hydronephrosis or mass.
Normal cortical echogenicity.  Moderate cortical thinning.

Left Kidney:  10.5 cm in length.  Normal cortical echogenicity.  No
hydronephrosis or mass.  Moderate cortical thinning.

Bladder:  Decompressed.
IMPRESSION: Chronic changes.  No evidence of hydronephrosis.

Incidental note is made of diffusely increased echogenicity
throughout the liver likely due to diffuse hepatic steatosis.

## 2014-09-11 ENCOUNTER — Other Ambulatory Visit: Payer: Self-pay | Admitting: Cardiovascular Disease

## 2014-09-11 NOTE — Telephone Encounter (Signed)
Rx(s) sent to pharmacy electronically.  

## 2014-09-26 ENCOUNTER — Ambulatory Visit (INDEPENDENT_AMBULATORY_CARE_PROVIDER_SITE_OTHER): Payer: Medicare Other | Admitting: Cardiovascular Disease

## 2014-09-26 ENCOUNTER — Encounter: Payer: Self-pay | Admitting: Cardiovascular Disease

## 2014-09-26 VITALS — BP 138/70 | HR 58 | Ht 71.0 in | Wt 232.5 lb

## 2014-09-26 DIAGNOSIS — I2510 Atherosclerotic heart disease of native coronary artery without angina pectoris: Secondary | ICD-10-CM | POA: Diagnosis not present

## 2014-09-26 DIAGNOSIS — I10 Essential (primary) hypertension: Secondary | ICD-10-CM

## 2014-09-26 DIAGNOSIS — N183 Chronic kidney disease, stage 3 unspecified: Secondary | ICD-10-CM

## 2014-09-26 DIAGNOSIS — I701 Atherosclerosis of renal artery: Secondary | ICD-10-CM

## 2014-09-26 DIAGNOSIS — I2583 Coronary atherosclerosis due to lipid rich plaque: Principal | ICD-10-CM

## 2014-09-26 DIAGNOSIS — N189 Chronic kidney disease, unspecified: Secondary | ICD-10-CM

## 2014-09-26 DIAGNOSIS — E785 Hyperlipidemia, unspecified: Secondary | ICD-10-CM

## 2014-09-26 NOTE — Assessment & Plan Note (Addendum)
History of hyperlipidemia on simvastatin 20 mg a day followed by his PCP 

## 2014-09-26 NOTE — Assessment & Plan Note (Signed)
History of bilateral renal artery stenosis status post stenting in 2010 2012. He had Doppler evidence of "in-stent restenosis with rising creatinine and underwent re-intervention by myself 02/15/14 performing an ICast covered  Stent in his left renal artery and performing Angiosculpt angioplasty of his right renal artery.his follow-up Dopplers performed 03/02/14 revealed excellent ultrasonographic result. His most recent Dopplers performed 08/29/14 did not visualize his proximal right renal artery because of rule out left lung bowel gas but he did have pulsus parvus and tardus blood flow in the mid and distal vessel suggesting high-grade proximal disease. His serum creatinine did improve significantly after bilateral renal artery intervention. I'm going to recheck renal Doppler studies in 3 months. He may require re-intervention on his right renal artery.

## 2014-09-26 NOTE — Assessment & Plan Note (Signed)
His last serum creatinine in the chart was 1.27 performed 02/17/14

## 2014-09-26 NOTE — Progress Notes (Signed)
09/26/2014 Steven Scott   1945-11-23  SN:[id number]  Primary Physician Rocky Morel, MD Primary Cardiologist: [name] MD Steven Scott   HPI:  The patient is a very pleasant 69 year old, moderately overweight, married Caucasian male, father of 2 who I last saw in the office 12 months ago. He has a history of persistent hypertension status post bilateral renal artery PTA and stenting with a positive Myoview, as well as heart catheterization that showed a high-grade distal RCA stenosis. I stented both his renal arteries successfully but was unable to cross his distal RCA, which Dr. Claiborne Billings subsequently performed successfully on March 28, 2008, with a Xience V drug-eluting stent with an excellent result. He clinically improved. He had an excellent lipid profile. Renal Dopplers performed January 31, 2011, showed progression of disease in both renal arteries suggesting high "in-stent restenosis." I performed angiography on him October 18 revealing 90% right and 70% left in-stent restenosis with 60 and 40-mm gradients, respectively. He underwent AngioSculpt cutting balloon atherectomy of both renal arteries with an excellent angiographic and followup Doppler result. His blood pressure has remained stable. He is otherwise asymptomatic. He was changed to generic antihypertensive medications off of Tribenzor and apparently his blood pressures have been more difficult to control since that time. He does admit to dietary indiscretion with regards to salt as well.  I saw Mr. [name] 04/19/12. He denies chest pain or shortness of breath. Apparently his creatinine has slowly increased now to close to 2. Recent renal Dopplers performed in our office 11/19/12 suggest rapid progression of the in-stent restenosis and throat within the left renal artery stent with a renal/aortic ratio of 7.33. Based on this, I re-angiogram him on 12/14/12 revealing an 80-90% mid in-stent restenosis" within  the left renal artery stent which I restented with a ICast  covered stent. His right renal artery had 60-70% ostial stenosis at that time. I performed cutting balloon angioplasty on the right renal artery in-stent restenosis.Followup Dopplers performed last month revealed normalization of his left renal artery to aortic ratio with mild progression of disease on the right. Since I saw him last recent Dopplers performed last month showed pulses parvus and tardus in the mid and distal right renal artery. The ostium was unable to be scan because of overlying bowel gas.   Current Outpatient Prescriptions  Medication Sig Dispense Refill  . acetaminophen (TYLENOL) 500 MG tablet Take 500 mg by mouth every 6 (six) hours as needed for moderate pain.    Marland Kitchen aspirin EC 325 MG EC tablet Take 1 tablet (325 mg total) by mouth daily. 30 tablet 0  . cholecalciferol (VITAMIN D) 1000 UNITS tablet Take 1,000 Units by mouth daily.    . clobetasol cream (TEMOVATE) AB-123456789 % Apply 1 application topically daily as needed (Psoriasis).    . clopidogrel (PLAVIX) 75 MG tablet Take 1 tablet (75 mg total) by mouth at bedtime. 30 tablet 6  . nebivolol (BYSTOLIC) 5 MG tablet Take 1 tablet (5 mg total) by mouth daily. 30 tablet 6  . nitroGLYCERIN (NITROSTAT) 0.4 MG SL tablet Place 0.4 mg under the tongue every 5 (five) minutes as needed for chest pain.     . Olmesartan-Amlodipine-HCTZ (TRIBENZOR) 40-10-25 MG TABS Take 1 tablet by mouth daily. 30 tablet 6  . simvastatin (ZOCOR) 20 MG tablet Take 1 tablet (20 mg total) by mouth at bedtime. 30 tablet 6   No current facility-administered medications for this visit.    Allergies  Allergen  Reactions  . Penicillins Rash    History   Social History  . Marital Status: Married    Spouse Name: N/A  . Number of Children: N/A  . Years of Education: N/A   Occupational History  . Not on file.   Social History Main Topics  . Smoking status: Former Smoker -- 2.00 packs/day for 50 years     Types: Cigarettes    Quit date: 02/24/2011  . Smokeless tobacco: Never Used  . Alcohol Use: No     Comment: 12/13/2012 "last alcohol was ~ 4 yr ago"  . Drug Use: No  . Sexual Activity: Not Currently   Other Topics Concern  . Not on file   Social History Narrative     Review of Systems: General: negative for chills, fever, night sweats or weight changes.  Cardiovascular: negative for chest pain, dyspnea on exertion, edema, orthopnea, palpitations, paroxysmal nocturnal dyspnea or shortness of breath Dermatological: negative for rash Respiratory: negative for cough or wheezing Urologic: negative for hematuria Abdominal: negative for nausea, vomiting, diarrhea, bright red blood per rectum, melena, or hematemesis Neurologic: negative for visual changes, syncope, or dizziness All other systems reviewed and are otherwise negative except as noted above.    Blood pressure 138/70, pulse 58, height 5\' 11"  (1.803 m), weight 232 lb 8 oz (105.461 kg).  General appearance: alert and no distress Neck: no adenopathy, no carotid bruit, no JVD, supple, symmetrical, trachea midline and thyroid not enlarged, symmetric, no tenderness/mass/nodules Lungs: clear to auscultation bilaterally Heart: regular rate and rhythm, S1, S2 normal, no murmur, click, rub or gallop Extremities: extremities normal, atraumatic, no cyanosis or edema  EKG sinus bradycardia 58 with evidence of lateral T-wave inversion unchanged from prior studies. I personally reviewed this EKG  ASSESSMENT AND PLAN:   Renal artery stenosis History of bilateral renal artery stenosis status post stenting in 2010 2012. He had Doppler evidence of "in-stent restenosis with rising creatinine and underwent re-intervention by myself 02/15/14 performing an ICast covered  Stent in his left renal artery and performing Angiosculpt angioplasty of his right renal artery.his follow-up Dopplers performed 03/02/14 revealed excellent ultrasonographic  result. His most recent Dopplers performed 08/29/14 did not visualize his proximal right renal artery because of rule out left lung bowel gas but he did have pulsus parvus and tardus blood flow in the mid and distal vessel suggesting high-grade proximal disease. His serum creatinine did improve significantly after bilateral renal artery intervention. I'm going to recheck renal Doppler studies in 3 months. He may require re-intervention on his right renal artery.   Hyperlipidemia History of hyperlipidemia on simvastatin 20 mg a day followed by his PCP   Essential hypertension History of hypertension blood pressure measured at 138/70. 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 Continue current meds at current dosing   Coronary artery disease- RCA DES 11/10 History of CAD status post RCA stenting by Dr. Claiborne Billings 1117/09 with a Xience 5 drug-eluting stent. He denies chest pain or shortness of breath.   Chronic renal insufficiency, stage III (moderate) His last serum creatinine in the chart was 1.27 performed 02/17/14       [name] MD [facility], [facility] 09/26/2014 9:58 AM

## 2014-09-26 NOTE — Assessment & Plan Note (Signed)
History of hypertension blood pressure measured at 138/70. 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 Continue current meds at current dosing

## 2014-09-26 NOTE — Patient Instructions (Signed)
We request that you follow-up in: 6 months with [name] [facility] and in 1 year with Dr Andria Rhein will receive a reminder letter in the mail two months in advance. If you don't receive a letter, please call our office to schedule the follow-up appointment.   Dr Gwenlyn Found has ordered a renal artery duplex (to be done in August 2016)- During this test, an ultrasound is used to evaluate blood flow to the kidneys. Allow one hour for this exam. Do not eat after midnight the day before and avoid carbonated beverages. Take your medications as you usually do.

## 2014-09-26 NOTE — Assessment & Plan Note (Addendum)
History of CAD status post RCA stenting by Dr. Claiborne Billings 1117/09 with a Xience 5 drug-eluting stent. He denies chest pain or shortness of breath.

## 2014-11-02 DIAGNOSIS — R809 Proteinuria, unspecified: Secondary | ICD-10-CM | POA: Diagnosis not present

## 2014-11-02 DIAGNOSIS — I129 Hypertensive chronic kidney disease with stage 1 through stage 4 chronic kidney disease, or unspecified chronic kidney disease: Secondary | ICD-10-CM | POA: Diagnosis not present

## 2014-11-02 DIAGNOSIS — D649 Anemia, unspecified: Secondary | ICD-10-CM | POA: Diagnosis not present

## 2014-11-02 DIAGNOSIS — Z79899 Other long term (current) drug therapy: Secondary | ICD-10-CM | POA: Diagnosis not present

## 2014-11-02 DIAGNOSIS — N183 Chronic kidney disease, stage 3 (moderate): Secondary | ICD-10-CM | POA: Diagnosis not present

## 2014-11-02 DIAGNOSIS — E559 Vitamin D deficiency, unspecified: Secondary | ICD-10-CM | POA: Diagnosis not present

## 2014-11-07 ENCOUNTER — Emergency Department (HOSPITAL_COMMUNITY): Payer: Medicare Other

## 2014-11-07 ENCOUNTER — Encounter (HOSPITAL_COMMUNITY): Payer: Self-pay | Admitting: Emergency Medicine

## 2014-11-07 ENCOUNTER — Inpatient Hospital Stay (HOSPITAL_COMMUNITY)
Admission: EM | Admit: 2014-11-07 | Discharge: 2014-11-09 | DRG: 378 | Disposition: A | Discharge status: Home / Self Care | Payer: Medicare Other | Attending: Internal Medicine | Admitting: Internal Medicine

## 2014-11-07 DIAGNOSIS — I10 Essential (primary) hypertension: Secondary | ICD-10-CM | POA: Diagnosis not present

## 2014-11-07 DIAGNOSIS — D649 Anemia, unspecified: Secondary | ICD-10-CM | POA: Diagnosis not present

## 2014-11-07 DIAGNOSIS — K922 Gastrointestinal hemorrhage, unspecified: Secondary | ICD-10-CM | POA: Diagnosis not present

## 2014-11-07 DIAGNOSIS — Z955 Presence of coronary angioplasty implant and graft: Secondary | ICD-10-CM | POA: Diagnosis not present

## 2014-11-07 DIAGNOSIS — K259 Gastric ulcer, unspecified as acute or chronic, without hemorrhage or perforation: Secondary | ICD-10-CM | POA: Diagnosis present

## 2014-11-07 DIAGNOSIS — R109 Unspecified abdominal pain: Secondary | ICD-10-CM | POA: Diagnosis present

## 2014-11-07 DIAGNOSIS — K5900 Constipation, unspecified: Secondary | ICD-10-CM | POA: Diagnosis present

## 2014-11-07 DIAGNOSIS — Z87891 Personal history of nicotine dependence: Secondary | ICD-10-CM

## 2014-11-07 DIAGNOSIS — Z7902 Long term (current) use of antithrombotics/antiplatelets: Secondary | ICD-10-CM | POA: Diagnosis not present

## 2014-11-07 DIAGNOSIS — I701 Atherosclerosis of renal artery: Secondary | ICD-10-CM

## 2014-11-07 DIAGNOSIS — K254 Chronic or unspecified gastric ulcer with hemorrhage: Secondary | ICD-10-CM | POA: Diagnosis present

## 2014-11-07 DIAGNOSIS — Z9861 Coronary angioplasty status: Secondary | ICD-10-CM

## 2014-11-07 DIAGNOSIS — K449 Diaphragmatic hernia without obstruction or gangrene: Secondary | ICD-10-CM | POA: Diagnosis not present

## 2014-11-07 DIAGNOSIS — N189 Chronic kidney disease, unspecified: Secondary | ICD-10-CM | POA: Diagnosis not present

## 2014-11-07 DIAGNOSIS — N183 Chronic kidney disease, stage 3 unspecified: Secondary | ICD-10-CM | POA: Diagnosis present

## 2014-11-07 DIAGNOSIS — Z825 Family history of asthma and other chronic lower respiratory diseases: Secondary | ICD-10-CM

## 2014-11-07 DIAGNOSIS — I2510 Atherosclerotic heart disease of native coronary artery without angina pectoris: Secondary | ICD-10-CM | POA: Diagnosis present

## 2014-11-07 DIAGNOSIS — Z7982 Long term (current) use of aspirin: Secondary | ICD-10-CM

## 2014-11-07 DIAGNOSIS — K2960 Other gastritis without bleeding: Secondary | ICD-10-CM | POA: Diagnosis present

## 2014-11-07 DIAGNOSIS — R7989 Other specified abnormal findings of blood chemistry: Secondary | ICD-10-CM | POA: Diagnosis not present

## 2014-11-07 DIAGNOSIS — E785 Hyperlipidemia, unspecified: Secondary | ICD-10-CM | POA: Diagnosis present

## 2014-11-07 DIAGNOSIS — R778 Other specified abnormalities of plasma proteins: Secondary | ICD-10-CM

## 2014-11-07 DIAGNOSIS — N261 Atrophy of kidney (terminal): Secondary | ICD-10-CM | POA: Diagnosis not present

## 2014-11-07 DIAGNOSIS — N179 Acute kidney failure, unspecified: Secondary | ICD-10-CM | POA: Diagnosis not present

## 2014-11-07 DIAGNOSIS — R748 Abnormal levels of other serum enzymes: Secondary | ICD-10-CM | POA: Diagnosis not present

## 2014-11-07 DIAGNOSIS — K921 Melena: Secondary | ICD-10-CM | POA: Diagnosis not present

## 2014-11-07 DIAGNOSIS — I129 Hypertensive chronic kidney disease with stage 1 through stage 4 chronic kidney disease, or unspecified chronic kidney disease: Secondary | ICD-10-CM | POA: Diagnosis present

## 2014-11-07 DIAGNOSIS — I248 Other forms of acute ischemic heart disease: Secondary | ICD-10-CM | POA: Diagnosis not present

## 2014-11-07 DIAGNOSIS — R05 Cough: Secondary | ICD-10-CM | POA: Diagnosis not present

## 2014-11-07 DIAGNOSIS — R1013 Epigastric pain: Secondary | ICD-10-CM | POA: Diagnosis not present

## 2014-11-07 DIAGNOSIS — K2970 Gastritis, unspecified, without bleeding: Secondary | ICD-10-CM | POA: Diagnosis not present

## 2014-11-07 DIAGNOSIS — D3501 Benign neoplasm of right adrenal gland: Secondary | ICD-10-CM | POA: Diagnosis not present

## 2014-11-07 LAB — URINALYSIS, ROUTINE W REFLEX MICROSCOPIC
Bilirubin Urine: NEGATIVE
Glucose, UA: NEGATIVE mg/dL
Hgb urine dipstick: NEGATIVE
Ketones, ur: NEGATIVE mg/dL
LEUKOCYTES UA: NEGATIVE
Nitrite: NEGATIVE
PROTEIN: NEGATIVE mg/dL
Specific Gravity, Urine: 1.02 (ref 1.005–1.030)
Urobilinogen, UA: 0.2 mg/dL (ref 0.0–1.0)
pH: 5.5 (ref 5.0–8.0)

## 2014-11-07 LAB — COMPREHENSIVE METABOLIC PANEL
ALBUMIN: 4 g/dL (ref 3.5–5.0)
ALK PHOS: 55 U/L (ref 38–126)
ALT: 14 U/L — ABNORMAL LOW (ref 17–63)
AST: 14 U/L — AB (ref 15–41)
Anion gap: 8 (ref 5–15)
BUN: 51 mg/dL — AB (ref 6–20)
CO2: 26 mmol/L (ref 22–32)
CREATININE: 1.97 mg/dL — AB (ref 0.61–1.24)
Calcium: 8.9 mg/dL (ref 8.9–10.3)
Chloride: 107 mmol/L (ref 101–111)
GFR calc Af Amer: 38 mL/min — ABNORMAL LOW (ref >60)
GFR calc non Af Amer: 33 mL/min — ABNORMAL LOW (ref >60)
Glucose, Bld: 105 mg/dL — ABNORMAL HIGH (ref 65–99)
Potassium: 4.2 mmol/L (ref 3.5–5.1)
SODIUM: 141 mmol/L (ref 135–145)
TOTAL PROTEIN: 7.1 g/dL (ref 6.5–8.1)
Total Bilirubin: 0.5 mg/dL (ref 0.3–1.2)

## 2014-11-07 LAB — CBC WITH DIFFERENTIAL/PLATELET
BASOS ABS: 0.1 10*3/uL (ref 0.0–0.1)
BASOS PCT: 1 % (ref 0–1)
EOS ABS: 0.3 10*3/uL (ref 0.0–0.7)
EOS PCT: 4 % (ref 0–5)
HEMATOCRIT: 39.7 % (ref 39.0–52.0)
HEMOGLOBIN: 12.9 g/dL — AB (ref 13.0–17.0)
LYMPHS ABS: 1.3 10*3/uL (ref 0.7–4.0)
Lymphocytes Relative: 14 % (ref 12–46)
MCH: 29.7 pg (ref 26.0–34.0)
MCHC: 32.5 g/dL (ref 30.0–36.0)
MCV: 91.5 fL (ref 78.0–100.0)
MONO ABS: 0.8 10*3/uL (ref 0.1–1.0)
MONOS PCT: 9 % (ref 3–12)
Neutro Abs: 6.6 10*3/uL (ref 1.7–7.7)
Neutrophils Relative %: 72 % (ref 43–77)
Platelets: 191 10*3/uL (ref 150–400)
RBC: 4.34 MIL/uL (ref 4.22–5.81)
RDW: 14 % (ref 11.5–15.5)
WBC: 9.1 10*3/uL (ref 4.0–10.5)

## 2014-11-07 LAB — PROTIME-INR
INR: 1.17 (ref 0.00–1.49)
PROTHROMBIN TIME: 15.1 s (ref 11.6–15.2)

## 2014-11-07 LAB — POC OCCULT BLOOD, ED: Fecal Occult Bld: POSITIVE — AB

## 2014-11-07 LAB — TYPE AND SCREEN
ABO/RH(D): O NEG
ANTIBODY SCREEN: NEGATIVE

## 2014-11-07 LAB — TROPONIN I
TROPONIN I: 0.1 ng/mL — AB (ref <0.031)
Troponin I: 0.11 ng/mL — ABNORMAL HIGH (ref <0.031)
Troponin I: 0.11 ng/mL — ABNORMAL HIGH (ref <0.031)

## 2014-11-07 LAB — LIPASE, BLOOD: Lipase: 24 U/L (ref 22–51)

## 2014-11-07 MED ORDER — HYDROCHLOROTHIAZIDE 25 MG PO TABS
25.0000 mg | ORAL_TABLET | Freq: Every day | ORAL | Status: DC
  Administered 2014-11-09: 25 mg via ORAL
  Filled 2014-11-07: qty 1

## 2014-11-07 MED ORDER — SIMVASTATIN 20 MG PO TABS
20.0000 mg | ORAL_TABLET | Freq: Every day | ORAL | Status: DC
  Administered 2014-11-07 – 2014-11-08 (×2): 20 mg via ORAL
  Filled 2014-11-07 (×2): qty 1

## 2014-11-07 MED ORDER — SODIUM CHLORIDE 0.9 % IV SOLN
INTRAVENOUS | Status: DC
  Administered 2014-11-08: 10:00:00 via INTRAVENOUS

## 2014-11-07 MED ORDER — ACETAMINOPHEN 650 MG RE SUPP: 650.0000 mg | Freq: Four times a day (QID) | RECTAL | Status: DC | PRN

## 2014-11-07 MED ORDER — NITROGLYCERIN 0.4 MG SL SUBL: 0.4000 mg | SUBLINGUAL_TABLET | SUBLINGUAL | Status: DC | PRN

## 2014-11-07 MED ORDER — AMLODIPINE BESYLATE 5 MG PO TABS
10.0000 mg | ORAL_TABLET | Freq: Every day | ORAL | Status: DC
  Administered 2014-11-09: 10 mg via ORAL
  Filled 2014-11-07: qty 2

## 2014-11-07 MED ORDER — CLOPIDOGREL BISULFATE 75 MG PO TABS
75.0000 mg | ORAL_TABLET | Freq: Every day | ORAL | Status: DC
  Administered 2014-11-07 – 2014-11-08 (×2): 75 mg via ORAL
  Filled 2014-11-07 (×2): qty 1

## 2014-11-07 MED ORDER — OXYCODONE HCL 5 MG PO TABS: 5.0000 mg | ORAL_TABLET | ORAL | Status: DC | PRN

## 2014-11-07 MED ORDER — ASPIRIN EC 325 MG PO TBEC: 325.0000 mg | DELAYED_RELEASE_TABLET | Freq: Every day | ORAL | Status: DC

## 2014-11-07 MED ORDER — ONDANSETRON HCL 4 MG PO TABS: 4.0000 mg | ORAL_TABLET | Freq: Four times a day (QID) | ORAL | Status: DC | PRN

## 2014-11-07 MED ORDER — IRBESARTAN 300 MG PO TABS
300.0000 mg | ORAL_TABLET | Freq: Every day | ORAL | Status: DC
  Administered 2014-11-09: 300 mg via ORAL
  Filled 2014-11-07: qty 1

## 2014-11-07 MED ORDER — NEBIVOLOL HCL 2.5 MG PO TABS
5.0000 mg | ORAL_TABLET | Freq: Every day | ORAL | Status: DC
  Administered 2014-11-09: 5 mg via ORAL
  Filled 2014-11-07 (×2): qty 2
  Filled 2014-11-07: qty 1

## 2014-11-07 MED ORDER — SODIUM CHLORIDE 0.9 % IV SOLN
8.0000 mg/h | INTRAVENOUS | Status: DC
  Administered 2014-11-07 – 2014-11-08 (×3): 8 mg/h via INTRAVENOUS
  Filled 2014-11-07 (×10): qty 80

## 2014-11-07 MED ORDER — ACETAMINOPHEN 325 MG PO TABS: 650.0000 mg | ORAL_TABLET | Freq: Four times a day (QID) | ORAL | Status: DC | PRN

## 2014-11-07 MED ORDER — PANTOPRAZOLE SODIUM 40 MG IV SOLR
INTRAVENOUS | Status: AC
  Filled 2014-11-07: qty 80

## 2014-11-07 MED ORDER — PANTOPRAZOLE SODIUM 40 MG IV SOLR: 40.0000 mg | Freq: Two times a day (BID) | INTRAVENOUS | Status: DC

## 2014-11-07 MED ORDER — SODIUM CHLORIDE 0.9 % IV SOLN
80.0000 mg | Freq: Once | INTRAVENOUS | Status: AC
  Administered 2014-11-07: 80 mg via INTRAVENOUS
  Filled 2014-11-07: qty 80

## 2014-11-07 MED ORDER — POLYETHYLENE GLYCOL 3350 17 G PO PACK
17.0000 g | PACK | Freq: Every day | ORAL | Status: DC
  Filled 2014-11-07: qty 1

## 2014-11-07 MED ORDER — HEPARIN SODIUM (PORCINE) 5000 UNIT/ML IJ SOLN
5000.0000 [IU] | Freq: Three times a day (TID) | INTRAMUSCULAR | Status: DC
  Administered 2014-11-07 – 2014-11-08 (×2): 5000 [IU] via SUBCUTANEOUS
  Filled 2014-11-07 (×2): qty 1

## 2014-11-07 MED ORDER — ONDANSETRON HCL 4 MG/2ML IJ SOLN: 4.0000 mg | Freq: Four times a day (QID) | INTRAMUSCULAR | Status: DC | PRN

## 2014-11-07 MED ORDER — ONDANSETRON HCL 4 MG/2ML IJ SOLN
4.0000 mg | INTRAMUSCULAR | Status: DC | PRN
  Administered 2014-11-07: 4 mg via INTRAVENOUS
  Filled 2014-11-07: qty 2

## 2014-11-07 MED ORDER — BISACODYL 10 MG RE SUPP: 10.0000 mg | Freq: Every day | RECTAL | Status: DC | PRN

## 2014-11-07 MED ORDER — SODIUM CHLORIDE 0.9 % IV SOLN
INTRAVENOUS | Status: DC
  Administered 2014-11-07 – 2014-11-08 (×2): via INTRAVENOUS

## 2014-11-07 MED ORDER — SODIUM CHLORIDE 0.9 % IJ SOLN
3.0000 mL | Freq: Two times a day (BID) | INTRAMUSCULAR | Status: DC
  Administered 2014-11-07 – 2014-11-08 (×2): 3 mL via INTRAVENOUS

## 2014-11-07 MED ORDER — ACETAMINOPHEN 325 MG PO TABS: 650.0000 mg | ORAL_TABLET | ORAL | Status: DC | PRN

## 2014-11-07 MED ORDER — MORPHINE SULFATE 2 MG/ML IJ SOLN
2.0000 mg | INTRAMUSCULAR | Status: DC | PRN
  Administered 2014-11-07: 2 mg via INTRAVENOUS
  Filled 2014-11-07: qty 1

## 2014-11-07 MED ORDER — SENNA 8.6 MG PO TABS
1.0000 | ORAL_TABLET | Freq: Two times a day (BID) | ORAL | Status: DC
  Administered 2014-11-07 – 2014-11-09 (×3): 8.6 mg via ORAL
  Filled 2014-11-07 (×3): qty 1

## 2014-11-07 MED ORDER — GI COCKTAIL ~~LOC~~: 30.0000 mL | Freq: Four times a day (QID) | ORAL | Status: DC | PRN

## 2014-11-07 MED ORDER — OLMESARTAN-AMLODIPINE-HCTZ 40-10-25 MG PO TABS: 1.0000 | ORAL_TABLET | Freq: Every day | ORAL | Status: DC

## 2014-11-07 NOTE — H&P (Signed)
[facility] History and Physical  ADYEN MIRAMON [id number] DOB: 12/25/1945 DOA: 11/07/2014  Referring physician: Dr. Thurnell Garbe - [facility] PCP: Rocky Morel, MD   Chief Complaint: GI bleed  HPI: Steven Scott is a 69 y.o. male  Patient presenting with complaint of generalized abdominal pain for the past week. Associate it with intermittent episodes of "dark tarry stools. "Patient has become increasingly fatigued over the last 3 days. Abdominal pain is described as soreness.  Last colonoscopy 3-4 years ago. 2 polyps which were removed. 5 yr f/u. Dr. Laural Golden Pain is located in the epigastric to left upper quadrant region. Patient does endorse occasional chest fluttering/"twinging" pain feeling in his chest. This typically only last a few seconds. No aggravating or alleviating factors.   Review of Systems:  Constitutional:  No weight loss, night sweats, Fevers, chills, fatigue.  HEENT:  No headaches, Difficulty swallowing,Tooth/dental problems,Sore throat,  No sneezing, itching, ear ache, nasal congestion, post nasal drip,  Cardio-vascular: Per HPI GI:  {Per HPI Resp:   No shortness of breath with exertion or at rest. No excess mucus, no productive cough, No non-productive cough, No coughing up of blood.No change in color of mucus.No wheezing.No chest wall deformity  Skin:  no rash or lesions.  GU:  no dysuria, change in color of urine, no urgency or frequency. No flank pain.  Musculoskeletal:   No joint pain or swelling. No decreased range of motion. No back pain.  Psych:  No change in mood or affect. No depression or anxiety. No memory loss.   Past Medical History  Diagnosis Date  . Coronary artery disease   . Hypertension   . Renal artery stenosis     Multiple interventions  . Status post percutaneous angioplasty of renal artery 10/18 2012    PV angiogram preformed revealing 90% right and 70% left in stent restenosis with 91mm and 55mm gradients  respectively.  . Hyperlipidemia   . Shortness of breath     "at any time" (12/13/2012)   Past Surgical History  Procedure Laterality Date  . Lithotripsy      "laser" (12/13/2012)  . Colonoscopy  12/18/2010    Procedure: COLONOSCOPY;  Surgeon: Rogene Houston, MD;  Location: [facility];  Service: Endoscopy;  Laterality: N/A;  . Coronary angioplasty with stent placement  03/28/2009    Successful PCI od high-grade 95% eccentric tandem plague stenosis in the distal right coronary artery with ultimate insertion of a 3.0 x 18 mm Xience V DES stent postdilated at 3.46 mm with the 99% stenosis being reduced to 0%  . Cardiac catheterization  03/02/2009    has moderate circumflex and diagonal branch disease with high-grade distal right disease and fail attempt at PCI  . Renal artery stent Bilateral 2010  . Renal artery angioplasty Bilateral 02/2014    PV restenting L RA with an ICast covered stent and cutting balloon atherectomy R RA  . Renal angiogram N/A 12/14/2012    Procedure: RENAL ANGIOGRAM;  Surgeon: [name], MD;  Location[facility] CATH LAB;  Service: Cardiovascular;  Laterality: N/A;  . Percutaneous stent intervention Left 12/14/2012    Procedure: PERCUTANEOUS STENT INTERVENTION;  Surgeon: [name], MD;  Location: [facility] CATH LAB;  Service: Cardiovascular;  Laterality: Left;  left renal artery  . Renal angiogram N/A 02/16/2014    Procedure: RENAL ANGIOGRAM;  Surgeon: [name], MD;  Location: [facility] CATH LAB;  Service: Cardiovascular;  Laterality: N/A;  . Percutaneous stent intervention  02/16/2014    Procedure: PERCUTANEOUS STENT INTERVENTION;  Surgeon: [name], MD;  Location: [facility] CATH LAB;  Service: Cardiovascular;;  left renal   Social History:  reports that he quit smoking about 3 years ago. His smoking use included Cigarettes. He has a 100 pack-year smoking history. He has never used smokeless tobacco. He reports that he does not drink alcohol or use illicit  drugs.  Allergies  Allergen Reactions  . Penicillins Rash    Family History  Problem Relation Age of Onset  . COPD Father 66    deceased     Prior to Admission medications   Medication Sig Start Date End Date Taking? Authorizing Provider  acetaminophen (TYLENOL) 500 MG tablet Take 500 mg by mouth every 6 (six) hours as needed for moderate pain.   Yes Historical Provider, MD  aspirin EC 325 MG EC tablet Take 1 tablet (325 mg total) by mouth daily. 12/15/12  Yes Brett Canales, PA-C  cholecalciferol (VITAMIN D) 1000 UNITS tablet Take 1,000 Units by mouth daily.   Yes Historical Provider, MD  clobetasol cream (TEMOVATE) AB-123456789 % Apply 1 application topically daily as needed (Psoriasis).   Yes Historical Provider, MD  clopidogrel (PLAVIX) 75 MG tablet Take 1 tablet (75 mg total) by mouth at bedtime. 09/11/14  Yes [name], MD  nebivolol (BYSTOLIC) 5 MG tablet Take 1 tablet (5 mg total) by mouth daily. 09/11/14  Yes [name], MD  nitroGLYCERIN (NITROSTAT) 0.4 MG SL tablet Place 0.4 mg under the tongue every 5 (five) minutes as needed for chest pain.    Yes Historical Provider, MD  Olmesartan-Amlodipine-HCTZ (TRIBENZOR) 40-10-25 MG TABS Take 1 tablet by mouth daily. 09/11/14  Yes [name], MD  simvastatin (ZOCOR) 20 MG tablet Take 1 tablet (20 mg total) by mouth at bedtime. 09/11/14  Yes [name], MD   Physical Exam: Filed Vitals:   11/07/14 1600 11/07/14 1630 11/07/14 1700 11/07/14 1753  BP: 168/84 152/63 173/68 141/66  Pulse: 61 58 62 57  Temp:    97.7 F (36.5 C)  TempSrc:    Oral  Resp: 15 18 17 15   Height:      Weight:      SpO2: 98% 99% 99% 100%    Wt Readings from Last 3 Encounters:  11/07/14 106.142 kg (234 lb)  09/26/14 105.461 kg (232 lb 8 oz)  03/14/14 105.008 kg (231 lb 8 oz)    General:  Appears calm and comfortable Eyes:  PERRL, normal lids, irises & conjunctiva ENT:  grossly normal hearing, lips & tongue Neck:  no LAD, masses or  thyromegaly Cardiovascular:  RRR, no m/r/g. No LE edema. Telemetry:  SR, no arrhythmias  Respiratory:  CTA bilaterally, no w/r/r. Normal respiratory effort. Abdomen:  soft, mild discomfort with deep palpation, normoactive bowel sounds Skin:  no rash or induration seen on limited exam Musculoskeletal:  grossly normal tone BUE/BLE Psychiatric:  grossly normal mood and affect, speech fluent and appropriate Neurologic:  grossly non-focal.          Labs on Admission:  Basic Metabolic Panel:  Recent Labs Lab 11/07/14 1258  NA 141  K 4.2  CL 107  CO2 26  GLUCOSE 105*  BUN 51*  CREATININE 1.97*  CALCIUM 8.9   Liver Function Tests:  Recent Labs Lab 11/07/14 1258  AST 14*  ALT 14*  ALKPHOS 55  BILITOT 0.5  PROT 7.1  ALBUMIN 4.0    Recent Labs Lab 11/07/14  1258  LIPASE 24   No results for input(s): AMMONIA in the last 168 hours. CBC:  Recent Labs Lab 11/07/14 1258  WBC 9.1  NEUTROABS 6.6  HGB 12.9*  HCT 39.7  MCV 91.5  PLT 191   Cardiac Enzymes:  Recent Labs Lab 11/07/14 1258 11/07/14 1730  TROPONINI 0.11* 0.10*    BNP (last 3 results) No results for input(s): BNP in the last 8760 hours.  ProBNP (last 3 results) No results for input(s): PROBNP in the last 8760 hours.  CBG: No results for input(s): GLUCAP in the last 168 hours.  Radiological Exams on Admission: Ct Abdomen Pelvis Wo Contrast  11/07/2014   CLINICAL DATA:  Dark tower a stools for 5 days. Abdominal pain for 1 week. The renal insufficiency. Fatigue.  EXAM: CT ABDOMEN AND PELVIS WITHOUT CONTRAST  TECHNIQUE: Multidetector CT imaging of the abdomen and pelvis was performed following the standard protocol without IV contrast.  COMPARISON:  11/04/2012  FINDINGS: Lower chest:  Coronary atherosclerosis.  Hepatobiliary: Unremarkable  Pancreas: Unremarkable  Spleen: Unremarkable  Adrenals/Urinary Tract: 1.6 by 1.9 cm right adrenal mass, -8 Hounsfield units density, compatible with adrenal adenoma.   Right renal atrophy. Hypodense right mid kidney lesion anteriorly, 1.4 cm diameter, internal density -5 Hounsfield units on image 37 series 2. Hypodense left mid to upper kidney lesion 3.5 by 2.8 cm on image 31 series 2, internal density -1 Hounsfield unit.  Slight asymmetry with the left ureter more prominent than the right, but possibly related to the fact that the right kidney is more atrophic than the left. No stones or overt obstruction. Urinary bladder unremarkable.  Stomach/Bowel: Sigmoid diverticulosis.  Vascular/Lymphatic: Aortoiliac atherosclerotic vascular disease. Bilateral renal artery stenting.  Reproductive: Prominent prostate gland, 6.8 by 5.0 cm on image 80 series 2. Calcifications along the lower prostate gland along with unusual horseshoe shaped fluid density just below the prostate gland with marginal calcifications, of uncertain significance.  Other: Subcutaneous edema adjacent to the umbilicus and extending caudad, possibly inflammatory.  Musculoskeletal: Degenerative arthropathy of both hips. Old left lower rib deformities from prior fractures.  3 mm grade 1 retrolisthesis at L1-2 with multilevel loss of intervertebral disc height and multilevel spurring. Disc bulges at L3-4 and L4-5. Lumbar spondylosis and degenerative disc disease lead to suspected impingement at L3-4, L4-5, and L5-S1.  IMPRESSION: 1. A specific cause for the patient' s presumed melena is not seen, although the patient does have sigmoid diverticulosis without active diverticulitis identified. 2. Right renal atrophy.  Bilateral renal cysts. 3. Right adrenal adenoma. 4. Coronary artery atherosclerosis. Aortoiliac atherosclerotic vascular disease. 5. Subcutaneous stranding below the umbilicus, possibly reflect low grade subcutaneous inflammation. 6. Lumbar spondylosis and degenerative disc disease cause multilevel impingement. 7. Degenerative arthropathy of both hips.   Electronically Signed   By: Van Clines M.D.   On:  11/07/2014 17:08   Dg Chest 2 View  11/07/2014   CLINICAL DATA:  Cough.  EXAM: CHEST  2 VIEW  COMPARISON:  February 13, 2014.  FINDINGS: The heart size and mediastinal contours are within normal limits. Both lungs are clear. No pneumothorax or pleural effusion is noted. Mild anterior osteophyte formation is noted in mid thoracic spine.  IMPRESSION: No active cardiopulmonary disease.   Electronically Signed   By: Marijo Conception, M.D.   On: 11/07/2014 14:28    EKG: Independently reviewed. Sinus, nonspecific T-wave changes, no overt sign of ACS  Assessment/Plan Principal Problem:   Elevated troponin Active Problems:   Hyperlipidemia  Coronary artery disease- RCA DES 11/10   Essential hypertension   Renal artery stenosis   Chronic renal insufficiency, stage III (moderate)   GI bleed   HLD (hyperlipidemia)   Abdominal pain   Constipation   Elevated Troponin: Troponin 0.1 on both initial and repeat. History of CAD status post stent placement in 2010. Patient compliant with aspirin Plavix. EKG with nonspecific T-wave changes but no overt sign of ACS. - Telemetry  - Cardiology consult in the a.m.  - Cycle troponins  - EKG in a.m.  - Continue aspirin and Plavix  - IVF  GI bleed: Likely upper GI bleed, the patient with diverticulosis noted on CT. Hemoglobin stable at 12.9. Last colonoscopy performed by Dr. Laural Golden, 3-4 years ago and was found to have 2 benign polyps. No NSAID use. On aspirin and Plavix. Discussed case with on-call physician Dr.Rourk, who agrees that patient could have scope performed either as inpatient or outpatient. Since patient is being admitted for ablation and troponins will seek further workup during admission - PPI Drip - Nothing by mouth after midnight for presumed EGD  AKI/CKD: Cardia 1.97. Baseline 1.3. Likely secondary to mild dehydration and chronic renal artery stenosis status post stent placement.. - IVF - BMET in a.m.  Abdominal pain: Likely secondary to  GERD/PUD, and possible constipation. Patient states he requires milk of magnesia in order to have bowel movements approximately every 3 days.. Question H. Pylori. CT without evidence of acute process. Improved after morphine and Zofran - Continue Zofran,  - GI workup as above - PPI - Senokot, MiraLAX - Hpylori  HTN: - Continue Bystolic, almost certain, amlodipine  HLD: - Continue statin  Code Status: FULL  DVT Prophylaxis: Hep Family Communication: None Disposition Plan: pending improvement     MERRELL, DAVID Lenna Sciara, MD Family Medicine [facility] [email or web address] Password [email or web address]

## 2014-11-07 NOTE — ED Provider Notes (Signed)
CSN: [id number]     Arrival date & time 11/07/14  1211 History   First MD Initiated Contact with Patient 11/07/14 1324     Chief Complaint  Patient presents with  . Blood In Stools  . Abdominal Pain  . Fatigue      HPI Pt was seen at 1330.  Per pt, c/o gradual onset and persistence of constant generalized abd "pain" for the past 1 week.  Has been associated with multiple intermittent episodes of "dark tarry stools" and generalized fatigue/weakness for the past 3 days.  Describes the abd pain as "sore."  Denies N/V, no diarrhea, no fevers, no back pain, no rash, no CP/SOB, no blood in stools.      Past Medical History  Diagnosis Date  . Coronary artery disease   . Hypertension   . Renal artery stenosis     Multiple interventions  . Status post percutaneous angioplasty of renal artery 10/18 2012    PV angiogram preformed revealing 90% right and 70% left in stent restenosis with 76mm and 59mm gradients respectively.  . Hyperlipidemia   . Shortness of breath     "at any time" (12/13/2012)   Past Surgical History  Procedure Laterality Date  . Lithotripsy      "laser" (12/13/2012)  . Colonoscopy  12/18/2010    Procedure: COLONOSCOPY;  Surgeon: Rogene Houston, MD;  Location: [facility];  Service: Endoscopy;  Laterality: N/A;  . Coronary angioplasty with stent placement  03/28/2009    Successful PCI od high-grade 95% eccentric tandem plague stenosis in the distal right coronary artery with ultimate insertion of a 3.0 x 18 mm Xience V DES stent postdilated at 3.46 mm with the 99% stenosis being reduced to 0%  . Cardiac catheterization  03/02/2009    has moderate circumflex and diagonal branch disease with high-grade distal right disease and fail attempt at PCI  . Renal artery stent Bilateral 2010  . Renal artery angioplasty Bilateral 02/2014    PV restenting L RA with an ICast covered stent and cutting balloon atherectomy R RA  . Renal angiogram N/A 12/14/2012    Procedure: RENAL  ANGIOGRAM;  Surgeon: [name], MD;  Location: [facility] CATH LAB;  Service: Cardiovascular;  Laterality: N/A;  . Percutaneous stent intervention Left 12/14/2012    Procedure: PERCUTANEOUS STENT INTERVENTION;  Surgeon: [name], MD;  Location: [facility] CATH LAB;  Service: Cardiovascular;  Laterality: Left;  left renal artery  . Renal angiogram N/A 02/16/2014    Procedure: RENAL ANGIOGRAM;  Surgeon: [name], MD;  Location: [facility] CATH LAB;  Service: Cardiovascular;  Laterality: N/A;  . Percutaneous stent intervention  02/16/2014    Procedure: PERCUTANEOUS STENT INTERVENTION;  Surgeon: [name], MD;  Location: [facility] CATH LAB;  Service: Cardiovascular;;  left renal   Family History  Problem Relation Age of Onset  . COPD Father 6    deceased   History  Substance Use Topics  . Smoking status: Former Smoker -- 2.00 packs/day for 50 years    Types: Cigarettes    Quit date: 02/24/2011  . Smokeless tobacco: Never Used  . Alcohol Use: No     Comment: 12/13/2012 "last alcohol was ~ 4 yr ago"    Review of Systems ROS: Statement: All systems negative except as marked or noted in the HPI; Constitutional: Negative for fever and chills. +generalized fatigue/weakness.; ; Eyes: Negative for eye pain, redness and discharge. ; ; ENMT: Negative for ear pain, hoarseness, nasal  congestion, sinus pressure and sore throat. ; ; Cardiovascular: Negative for chest pain, palpitations, diaphoresis, dyspnea and peripheral edema. ; ; Respiratory: Negative for cough, wheezing and stridor. ; ; Gastrointestinal: +abd pain, "dark stools." Negative for nausea, vomiting, diarrhea, blood in stool, hematemesis, jaundice and rectal bleeding. . ; ; Genitourinary: Negative for dysuria, flank pain and hematuria. ; ; Musculoskeletal: Negative for back pain and neck pain. Negative for swelling and trauma.; ; Skin: Negative for pruritus, rash, abrasions, blisters, bruising and skin lesion.; ; Neuro: Negative for headache,  lightheadedness and neck stiffness. Negative for altered level of consciousness , altered mental status, extremity weakness, paresthesias, involuntary movement, seizure and syncope.      Allergies  Penicillins  Home Medications   Prior to Admission medications   Medication Sig Start Date End Date Taking? Authorizing Provider  acetaminophen (TYLENOL) 500 MG tablet Take 500 mg by mouth every 6 (six) hours as needed for moderate pain.   Yes Historical Provider, MD  aspirin EC 325 MG EC tablet Take 1 tablet (325 mg total) by mouth daily. 12/15/12  Yes Brett Canales, PA-C  cholecalciferol (VITAMIN D) 1000 UNITS tablet Take 1,000 Units by mouth daily.   Yes Historical Provider, MD  clobetasol cream (TEMOVATE) [id number] % Apply 1 application topically daily as needed (Psoriasis).   Yes Historical Provider, MD  clopidogrel (PLAVIX) 75 MG tablet Take 1 tablet (75 mg total) by mouth at bedtime. 09/11/14  Yes [name], MD  nebivolol (BYSTOLIC) 5 MG tablet Take 1 tablet (5 mg total) by mouth daily. 09/11/14  Yes [name], MD  nitroGLYCERIN (NITROSTAT) 0.4 MG SL tablet Place 0.4 mg under the tongue every 5 (five) minutes as needed for chest pain.    Yes Historical Provider, MD  Olmesartan-Amlodipine-HCTZ (TRIBENZOR) 40-10-25 MG TABS Take 1 tablet by mouth daily. 09/11/14  Yes [name], MD  simvastatin (ZOCOR) 20 MG tablet Take 1 tablet (20 mg total) by mouth at bedtime. 09/11/14  Yes [name], MD   BP 173/65 mmHg  Pulse 62  Temp(Src) 97.6 F (36.4 C) (Oral)  Resp 16  Ht 5\' 11"  (1.803 m)  Wt 234 lb (106.142 kg)  BMI 32.65 kg/m2  SpO2 100%   14:00 Orthostatic Vital Signs AC  Orthostatic Lying  - BP- Lying: 173/65 mmHg ; Pulse- Lying: 64  Orthostatic Sitting - BP- Sitting: 143/72 mmHg ; Pulse- Sitting: 72  Orthostatic Standing at 0 minutes - BP- Standing at 0 minutes: 167/60 mmHg ; Pulse- Standing at 0 minutes: 70      Physical Exam  1335: Physical examination:  Nursing notes  reviewed; Vital signs and O2 SAT reviewed;  Constitutional: Well developed, Well nourished, Well hydrated, In no acute distress; Head:  Normocephalic, atraumatic; Eyes: EOMI, PERRL, No scleral icterus; ENMT: Mouth and pharynx normal, Mucous membranes moist; Neck: Supple, Full range of motion, No lymphadenopathy; Cardiovascular: Regular rate and rhythm, No gallop; Respiratory: Breath sounds clear & equal bilaterally, No wheezes.  Speaking full sentences with ease, Normal respiratory effort/excursion; Chest: Nontender, Movement normal; Abdomen: Soft, +mild diffuse tenderness to palp. No rebound or guarding. Nondistended, Normal bowel sounds; Genitourinary: No CVA tenderness; Extremities: Pulses normal, No tenderness, No edema, No calf edema or asymmetry.; Neuro: AA&Ox3, Major CN grossly intact.  Speech clear. No gross focal motor or sensory deficits in extremities.; Skin: Color normal, Warm, Dry.   ED Course  Procedures     EKG Interpretation   Date/Time:  Tuesday November 07 2014 13:59:16  EDT Ventricular Rate:  65 PR Interval:  191 QRS Duration: 79 QT Interval:  366 QTC Calculation: 380 R Axis:   70 Text Interpretation:  Sinus rhythm Anteroseptal infarct, old Nonspecific T  abnormalities, lateral leads Baseline wander When compared with ECG of  12/14/2012 No significant change was found Confirmed by [facility]  MD,  Nunzio Cory [phone]) on 11/07/2014 2:17:32 PM      MDM  MDM Reviewed: previous chart, nursing note and vitals Reviewed previous: labs and ECG Interpretation: labs, ECG, x-ray and CT scan      Results for orders placed or performed during the hospital encounter of 11/07/14  CBC with Differential  Result Value Ref Range   WBC 9.1 4.0 - 10.5 K/uL   RBC 4.34 4.22 - 5.81 MIL/uL   Hemoglobin 12.9 (L) 13.0 - 17.0 g/dL   HCT 39.7 39.0 - 52.0 %   MCV 91.5 78.0 - 100.0 fL   MCH 29.7 26.0 - 34.0 pg   MCHC 32.5 30.0 - 36.0 g/dL   RDW 14.0 11.5 - 15.5 %   Platelets 191 150 - 400 K/uL    Neutrophils Relative % 72 43 - 77 %   Neutro Abs 6.6 1.7 - 7.7 K/uL   Lymphocytes Relative 14 12 - 46 %   Lymphs Abs 1.3 0.7 - 4.0 K/uL   Monocytes Relative 9 3 - 12 %   Monocytes Absolute 0.8 0.1 - 1.0 K/uL   Eosinophils Relative 4 0 - 5 %   Eosinophils Absolute 0.3 0.0 - 0.7 K/uL   Basophils Relative 1 0 - 1 %   Basophils Absolute 0.1 0.0 - 0.1 K/uL  Comprehensive metabolic panel  Result Value Ref Range   Sodium 141 135 - 145 mmol/L   Potassium 4.2 3.5 - 5.1 mmol/L   Chloride 107 101 - 111 mmol/L   CO2 26 22 - 32 mmol/L   Glucose, Bld 105 (H) 65 - 99 mg/dL   BUN 51 (H) 6 - 20 mg/dL   Creatinine, Ser 1.97 (H) 0.61 - 1.24 mg/dL   Calcium 8.9 8.9 - 10.3 mg/dL   Total Protein 7.1 6.5 - 8.1 g/dL   Albumin 4.0 3.5 - 5.0 g/dL   AST 14 (L) 15 - 41 U/L   ALT 14 (L) 17 - 63 U/L   Alkaline Phosphatase 55 38 - 126 U/L   Total Bilirubin 0.5 0.3 - 1.2 mg/dL   GFR calc non Af Amer 33 (L) >60 mL/min   GFR calc Af Amer 38 (L) >60 mL/min   Anion gap 8 5 - 15  Protime-INR  Result Value Ref Range   Prothrombin Time 15.1 11.6 - 15.2 seconds   INR 1.17 0.00 - 1.49  Troponin I  Result Value Ref Range   Troponin I 0.11 (H) <0.031 ng/mL  Urinalysis, Routine w reflex microscopic (not at [facility])  Result Value Ref Range   Color, Urine YELLOW YELLOW   APPearance CLEAR CLEAR   Specific Gravity, Urine 1.020 1.005 - 1.030   pH 5.5 5.0 - 8.0   Glucose, UA NEGATIVE NEGATIVE mg/dL   Hgb urine dipstick NEGATIVE NEGATIVE   Bilirubin Urine NEGATIVE NEGATIVE   Ketones, ur NEGATIVE NEGATIVE mg/dL   Protein, ur NEGATIVE NEGATIVE mg/dL   Urobilinogen, UA 0.2 0.0 - 1.0 mg/dL   Nitrite NEGATIVE NEGATIVE   Leukocytes, UA NEGATIVE NEGATIVE  Lipase, blood  Result Value Ref Range   Lipase 24 22 - 51 U/L  POC occult blood, ED RN will collect  Result Value Ref Range   Fecal Occult Bld POSITIVE (A) NEGATIVE  Type and screen  Result Value Ref Range   ABO/RH(D) O NEG    Antibody Screen NEG    Sample  Expiration 11/10/2014    Ct Abdomen Pelvis Wo Contrast 11/07/2014   CLINICAL DATA:  Dark tower a stools for 5 days. Abdominal pain for 1 week. The renal insufficiency. Fatigue.  EXAM: CT ABDOMEN AND PELVIS WITHOUT CONTRAST  TECHNIQUE: Multidetector CT imaging of the abdomen and pelvis was performed following the standard protocol without IV contrast.  COMPARISON:  11/04/2012  FINDINGS: Lower chest:  Coronary atherosclerosis.  Hepatobiliary: Unremarkable  Pancreas: Unremarkable  Spleen: Unremarkable  Adrenals/Urinary Tract: 1.6 by 1.9 cm right adrenal mass, -8 Hounsfield units density, compatible with adrenal adenoma.  Right renal atrophy. Hypodense right mid kidney lesion anteriorly, 1.4 cm diameter, internal density -5 Hounsfield units on image 37 series 2. Hypodense left mid to upper kidney lesion 3.5 by 2.8 cm on image 31 series 2, internal density -1 Hounsfield unit.  Slight asymmetry with the left ureter more prominent than the right, but possibly related to the fact that the right kidney is more atrophic than the left. No stones or overt obstruction. Urinary bladder unremarkable.  Stomach/Bowel: Sigmoid diverticulosis.  Vascular/Lymphatic: Aortoiliac atherosclerotic vascular disease. Bilateral renal artery stenting.  Reproductive: Prominent prostate gland, 6.8 by 5.0 cm on image 80 series 2. Calcifications along the lower prostate gland along with unusual horseshoe shaped fluid density just below the prostate gland with marginal calcifications, of uncertain significance.  Other: Subcutaneous edema adjacent to the umbilicus and extending caudad, possibly inflammatory.  Musculoskeletal: Degenerative arthropathy of both hips. Old left lower rib deformities from prior fractures.  3 mm grade 1 retrolisthesis at L1-2 with multilevel loss of intervertebral disc height and multilevel spurring. Disc bulges at L3-4 and L4-5. Lumbar spondylosis and degenerative disc disease lead to suspected impingement at L3-4, L4-5,  and L5-S1.  IMPRESSION: 1. A specific cause for the patient' s presumed melena is not seen, although the patient does have sigmoid diverticulosis without active diverticulitis identified. 2. Right renal atrophy.  Bilateral renal cysts. 3. Right adrenal adenoma. 4. Coronary artery atherosclerosis. Aortoiliac atherosclerotic vascular disease. 5. Subcutaneous stranding below the umbilicus, possibly reflect low grade subcutaneous inflammation. 6. Lumbar spondylosis and degenerative disc disease cause multilevel impingement. 7. Degenerative arthropathy of both hips.   Electronically Signed   By: Van Clines M.D.   On: 11/07/2014 17:08   Dg Chest 2 View 11/07/2014   CLINICAL DATA:  Cough.  EXAM: CHEST  2 VIEW  COMPARISON:  February 13, 2014.  FINDINGS: The heart size and mediastinal contours are within normal limits. Both lungs are clear. No pneumothorax or pleural effusion is noted. Mild anterior osteophyte formation is noted in mid thoracic spine.  IMPRESSION: No active cardiopulmonary disease.   Electronically Signed   By: Marijo Conception, M.D.   On: 11/07/2014 14:28    1725:  No stooling while in the ED. Pt is not orthostatic on VS. Troponin elevated, but no acute STTW changes on EKG and pt denies CP (only c/o fatigue). Will hold ASA d/t GIB. BUN/Cr elevated; will continue IVF. Dx and testing d/w pt.  Questions answered.  Verb understanding, agreeable to admit. T/C to [facility] Dr. Marily Memos, case discussed, including:  HPI, pertinent PM/SHx, VS/PE, dx testing, ED course and treatment:  Agreeable to come to ED for evaluation.   Francine Graven, DO 11/09/14 1530

## 2014-11-07 NOTE — ED Notes (Signed)
Pt reports dark tarry stools x 5 days. C/O abdominal pain x 1 week but states he does not have abd pain today.

## 2014-11-08 ENCOUNTER — Encounter (HOSPITAL_COMMUNITY): Payer: Self-pay

## 2014-11-08 ENCOUNTER — Encounter (HOSPITAL_COMMUNITY)
Admission: EM | Disposition: A | Discharge status: Home / Self Care | Payer: Self-pay | Source: Home / Self Care | Attending: Internal Medicine

## 2014-11-08 DIAGNOSIS — Z791 Long term (current) use of non-steroidal anti-inflammatories (NSAID): Secondary | ICD-10-CM

## 2014-11-08 DIAGNOSIS — R7989 Other specified abnormal findings of blood chemistry: Secondary | ICD-10-CM

## 2014-11-08 DIAGNOSIS — I10 Essential (primary) hypertension: Secondary | ICD-10-CM

## 2014-11-08 DIAGNOSIS — K2970 Gastritis, unspecified, without bleeding: Secondary | ICD-10-CM

## 2014-11-08 DIAGNOSIS — K922 Gastrointestinal hemorrhage, unspecified: Secondary | ICD-10-CM

## 2014-11-08 DIAGNOSIS — D649 Anemia, unspecified: Secondary | ICD-10-CM

## 2014-11-08 DIAGNOSIS — K449 Diaphragmatic hernia without obstruction or gangrene: Secondary | ICD-10-CM

## 2014-11-08 DIAGNOSIS — R109 Unspecified abdominal pain: Secondary | ICD-10-CM

## 2014-11-08 DIAGNOSIS — N183 Chronic kidney disease, stage 3 unspecified: Secondary | ICD-10-CM | POA: Diagnosis present

## 2014-11-08 DIAGNOSIS — K259 Gastric ulcer, unspecified as acute or chronic, without hemorrhage or perforation: Secondary | ICD-10-CM

## 2014-11-08 HISTORY — PX: ESOPHAGOGASTRODUODENOSCOPY: SHX5428

## 2014-11-08 LAB — COMPREHENSIVE METABOLIC PANEL
ALT: 13 U/L — AB (ref 17–63)
ANION GAP: 8 (ref 5–15)
AST: 12 U/L — ABNORMAL LOW (ref 15–41)
Albumin: 3.6 g/dL (ref 3.5–5.0)
Alkaline Phosphatase: 42 U/L (ref 38–126)
BUN: 48 mg/dL — ABNORMAL HIGH (ref 6–20)
CALCIUM: 8.3 mg/dL — AB (ref 8.9–10.3)
CO2: 25 mmol/L (ref 22–32)
Chloride: 107 mmol/L (ref 101–111)
Creatinine, Ser: 1.94 mg/dL — ABNORMAL HIGH (ref 0.61–1.24)
GFR calc non Af Amer: 33 mL/min — ABNORMAL LOW (ref >60)
GFR, EST AFRICAN AMERICAN: 39 mL/min — AB (ref >60)
GLUCOSE: 86 mg/dL (ref 65–99)
Potassium: 3.8 mmol/L (ref 3.5–5.1)
SODIUM: 140 mmol/L (ref 135–145)
TOTAL PROTEIN: 6 g/dL — AB (ref 6.5–8.1)
Total Bilirubin: 0.6 mg/dL (ref 0.3–1.2)

## 2014-11-08 LAB — CBC
HCT: 35 % — ABNORMAL LOW (ref 39.0–52.0)
HEMOGLOBIN: 11.4 g/dL — AB (ref 13.0–17.0)
MCH: 29.5 pg (ref 26.0–34.0)
MCHC: 32.6 g/dL (ref 30.0–36.0)
MCV: 90.7 fL (ref 78.0–100.0)
PLATELETS: 183 10*3/uL (ref 150–400)
RBC: 3.86 MIL/uL — AB (ref 4.22–5.81)
RDW: 13.9 % (ref 11.5–15.5)
WBC: 7.4 10*3/uL (ref 4.0–10.5)

## 2014-11-08 LAB — APTT: aPTT: 36 seconds (ref 24–37)

## 2014-11-08 LAB — PROTIME-INR
INR: 1.26 (ref 0.00–1.49)
PROTHROMBIN TIME: 15.9 s — AB (ref 11.6–15.2)

## 2014-11-08 LAB — TROPONIN I
Troponin I: 0.09 ng/mL — ABNORMAL HIGH (ref <0.031)
Troponin I: 0.1 ng/mL — ABNORMAL HIGH (ref <0.031)

## 2014-11-08 SURGERY — EGD (ESOPHAGOGASTRODUODENOSCOPY)
Anesthesia: Moderate Sedation

## 2014-11-08 MED ORDER — STERILE WATER FOR IRRIGATION IR SOLN
Status: DC | PRN
  Administered 2014-11-08: 15:00:00

## 2014-11-08 MED ORDER — MIDAZOLAM HCL 5 MG/5ML IJ SOLN
INTRAMUSCULAR | Status: AC
  Filled 2014-11-08: qty 10

## 2014-11-08 MED ORDER — MEPERIDINE HCL 50 MG/ML IJ SOLN
INTRAMUSCULAR | Status: DC | PRN
  Administered 2014-11-08: 25 mg via INTRAVENOUS

## 2014-11-08 MED ORDER — MIDAZOLAM HCL 5 MG/5ML IJ SOLN
INTRAMUSCULAR | Status: DC | PRN
  Administered 2014-11-08 (×2): 2 mg via INTRAVENOUS

## 2014-11-08 MED ORDER — MEPERIDINE HCL 50 MG/ML IJ SOLN
INTRAMUSCULAR | Status: AC
  Filled 2014-11-08: qty 1

## 2014-11-08 MED ORDER — SUCRALFATE 1 GM/10ML PO SUSP
1.0000 g | Freq: Three times a day (TID) | ORAL | Status: DC
  Administered 2014-11-08 – 2014-11-09 (×5): 1 g via ORAL
  Filled 2014-11-08 (×5): qty 10

## 2014-11-08 MED ORDER — BUTAMBEN-TETRACAINE-BENZOCAINE 2-2-14 % EX AERO
INHALATION_SPRAY | CUTANEOUS | Status: DC | PRN
  Administered 2014-11-08: 2 via TOPICAL

## 2014-11-08 NOTE — Op Note (Signed)
EGD PROCEDURE REPORT  PATIENT:  Steven Scott  MR#:  [id number] Birthdate:  1946/03/19, 69 y.o., male Endoscopist:  Dr. Rogene Houston, MD Referred By:  Dr. Kathie Dike, MD Procedure Date: 11/08/2014  Procedure:   EGD  Indications:  Patient is 69 year old Caucasian male who presents with one month history of upper and lower abdominal pain,three-day history of melena and anemia. Patient is on Plavix and full dose aspirin. There is no prior history of peptic ulcer disease.            Informed Consent:  The risks, benefits, alternatives & imponderables which include, but are not limited to, bleeding, infection, perforation, drug reaction and potential missed lesion have been reviewed.  The potential for biopsy, lesion removal, esophageal dilation, etc. have also been discussed.  Questions have been answered.  All parties agreeable.  Please see history & physical in medical record for more information.  Medications:  Demerol 25 mg IV Versed 4 mg IV Cetacaine spray topically for oropharyngeal anesthesia  Description of procedure:  The endoscope was introduced through the mouth and advanced to the second portion of the duodenum without difficulty or limitations. The mucosal surfaces were surveyed very carefully during advancement of the scope and upon withdrawal.  Findings:  Esophagus:  Mucosa of the esophagus was normal. GE junction unremarkable. GEJ:  41 cm Hiatus:  43 cm Stomach:  Stomach was empty and distended very well with insufflation. Folds in the proximal stomach were normal. Examination of gastric mucosa and body was normal. 2 small antral ulcers noted. One towards greater curvatures 3 x 5 mm and the one towards lesser curvature was 4 x 8 mm and had tiny brownish pigment. This ulcer was felt to be likely source of bleed. No active bleeding noted and therefore no therapy rendered. Few antral erosions noted. Angularis fundus and cardia were normal. Duodenum:  Normal bulbar and post  bulbar mucosa.  Therapeutic/Diagnostic Maneuvers Performed:  None  Complications: None  Impression: Small sliding hiatal hernia. 3 x 5 mm antral ulcer with clean base. 4 x 43mm antral ulcer with tiny speck of pigment suspected to be source of bleeding but no active bleeding identified. No therapy rendered. Antral gastritis.  Comment; Given patient's symptoms he may also have small bowel NSAID injury as well.  Recommendations:  Hold aspirin for 1 week but will continue clopidogrel. Continue pantoprazole infusion for another 24 hours and then switch to oral route. Discontinue heparin and use mechanical DVT prophylaxis. Sucralfate 1 g by mouth before meals and daily at bedtime for two weeks. H. pylori serology. Consider starting aspirin at reduced dose. CBC in a.m.    Metzli Pollick U  11/08/2014  3:39 PM  CC: Dr. Ethlyn Gallery, Kassie Mends, MD & Dr. Rayne Du ref. provider found

## 2014-11-08 NOTE — Care Management Note (Signed)
Case Management Note  Patient Details  Name: Steven Scott MRN: [id number] Date of Birth: Nov 06, 1945  Subjective/Objective:                  Pt admitted from home with gi bleed. Pt lives with his disabled wife and will return home at discharge. Pt is independent with ADL's.  Action/Plan: No CM needs noted.  Expected Discharge Date:    11/09/14              Expected Discharge Plan:  Home/Self Care  In-House Referral:  NA  Discharge planning Services  CM Consult  Post Acute Care Choice:  NA Choice offered to:  NA  DME Arranged:    DME Agency:     HH Arranged:    HH Agency:     Status of Service:  Completed, signed off  Medicare Important Message Given:    Date Medicare IM Given:    Medicare IM give by:    Date Additional Medicare IM Given:    Additional Medicare Important Message give by:     If discussed at [place] of Stay Meetings, dates discussed:    Additional Comments:  Joylene Draft, RN 11/08/2014, 12:36 PM

## 2014-11-08 NOTE — Progress Notes (Signed)
[facility] PROGRESS NOTE  NAYTHEN FRARY [id number] DOB: 1945-06-13 DOA: 11/07/2014 PCP: Rocky Morel, MD  Assessment/Plan: 1. GI bleeding. Likely upper GI bleed. Hemoglobin has mildly declined since admission. GI is following. He is on a Protonix infusion. Denies any NSAID use. Will likely need EGD. 2. Demand ischemia. Troponins only mildly elevated in the setting of elevated creatinine and GI bleeding. He has not had any typical chest pain. EKG was unremarkable. Discussed with Dr. Domenic Polite and no further work up at this time. 3. AKI on CKD stage III. Baseline creatinine around 1.3. Continue IV fluids for now. 4. History of renal artery stenosis. prior stent placement in the past. Follow-up with Dr. Gwenlyn Found. 5. Anemia. Mild anemia in the setting of GI bleeding, likely related to blood loss. Continue to monitor. 6. Hypertension. Stable. Continue current treatments. 7. Hyperlipidemia. Continue statin. 8. Coronary artery disease with stents placed in the past. Reports his last stress test was done in 2010. Would recommend follow-up with cardiology for further management.  Code Status: full code Family Communication: discussed with patient Disposition Plan: discharge home when improved   Consultants:  GI  Procedures:    Antibiotics:    HPI/Subjective: No chest pain, shortness of breath. He does have some lower abdominal pain. No vomiting.  Objective: Filed Vitals:   11/08/14 0616  BP: 119/31  Pulse: 57  Temp: 98.5 F (36.9 C)  Resp: 20    Intake/Output Summary (Last 24 hours) at 11/08/14 1049 Last data filed at 11/08/14 0336  Gross per 24 hour  Intake  112.5 ml  Output    400 ml  Net -287.5 ml   Filed Weights   11/07/14 1215 11/08/14 0616  Weight: 106.142 kg (234 lb) 104.872 kg (231 lb 3.2 oz)    Exam:   General:  NAD  Cardiovascular: S1, S2 RRR  Respiratory: CTA B  Abdomen: soft, nt, nd, bs+  Musculoskeletal: no edema b/l   Data  Reviewed: Basic Metabolic Panel:  Recent Labs Lab 11/07/14 1258 11/08/14 0416  NA 141 140  K 4.2 3.8  CL 107 107  CO2 26 25  GLUCOSE 105* 86  BUN 51* 48*  CREATININE 1.97* 1.94*  CALCIUM 8.9 8.3*   Liver Function Tests:  Recent Labs Lab 11/07/14 1258 11/08/14 0416  AST 14* 12*  ALT 14* 13*  ALKPHOS 55 42  BILITOT 0.5 0.6  PROT 7.1 6.0*  ALBUMIN 4.0 3.6    Recent Labs Lab 11/07/14 1258  LIPASE 24   No results for input(s): AMMONIA in the last 168 hours. CBC:  Recent Labs Lab 11/07/14 1258 11/08/14 0416  WBC 9.1 7.4  NEUTROABS 6.6  --   HGB 12.9* 11.4*  HCT 39.7 35.0*  MCV 91.5 90.7  PLT 191 183   Cardiac Enzymes:  Recent Labs Lab 11/07/14 1258 11/07/14 1730 11/07/14 2131 11/08/14 0047 11/08/14 0416  TROPONINI 0.11* 0.10* 0.11* 0.09* 0.10*   BNP (last 3 results) No results for input(s): BNP in the last 8760 hours.  ProBNP (last 3 results) No results for input(s): PROBNP in the last 8760 hours.  CBG: No results for input(s): GLUCAP in the last 168 hours.  No results found for this or any previous visit (from the past 240 hour(s)).   Studies: Ct Abdomen Pelvis Wo Contrast  11/07/2014   CLINICAL DATA:  Dark tower a stools for 5 days. Abdominal pain for 1 week. The renal insufficiency. Fatigue.  EXAM: CT ABDOMEN AND PELVIS WITHOUT CONTRAST  TECHNIQUE:  Multidetector CT imaging of the abdomen and pelvis was performed following the standard protocol without IV contrast.  COMPARISON:  11/04/2012  FINDINGS: Lower chest:  Coronary atherosclerosis.  Hepatobiliary: Unremarkable  Pancreas: Unremarkable  Spleen: Unremarkable  Adrenals/Urinary Tract: 1.6 by 1.9 cm right adrenal mass, -8 Hounsfield units density, compatible with adrenal adenoma.  Right renal atrophy. Hypodense right mid kidney lesion anteriorly, 1.4 cm diameter, internal density -5 Hounsfield units on image 37 series 2. Hypodense left mid to upper kidney lesion 3.5 by 2.8 cm on image 31 series  2, internal density -1 Hounsfield unit.  Slight asymmetry with the left ureter more prominent than the right, but possibly related to the fact that the right kidney is more atrophic than the left. No stones or overt obstruction. Urinary bladder unremarkable.  Stomach/Bowel: Sigmoid diverticulosis.  Vascular/Lymphatic: Aortoiliac atherosclerotic vascular disease. Bilateral renal artery stenting.  Reproductive: Prominent prostate gland, 6.8 by 5.0 cm on image 80 series 2. Calcifications along the lower prostate gland along with unusual horseshoe shaped fluid density just below the prostate gland with marginal calcifications, of uncertain significance.  Other: Subcutaneous edema adjacent to the umbilicus and extending caudad, possibly inflammatory.  Musculoskeletal: Degenerative arthropathy of both hips. Old left lower rib deformities from prior fractures.  3 mm grade 1 retrolisthesis at L1-2 with multilevel loss of intervertebral disc height and multilevel spurring. Disc bulges at L3-4 and L4-5. Lumbar spondylosis and degenerative disc disease lead to suspected impingement at L3-4, L4-5, and L5-S1.  IMPRESSION: 1. A specific cause for the patient' s presumed melena is not seen, although the patient does have sigmoid diverticulosis without active diverticulitis identified. 2. Right renal atrophy.  Bilateral renal cysts. 3. Right adrenal adenoma. 4. Coronary artery atherosclerosis. Aortoiliac atherosclerotic vascular disease. 5. Subcutaneous stranding below the umbilicus, possibly reflect low grade subcutaneous inflammation. 6. Lumbar spondylosis and degenerative disc disease cause multilevel impingement. 7. Degenerative arthropathy of both hips.   Electronically Signed   By: Van Clines M.D.   On: 11/07/2014 17:08   Dg Chest 2 View  11/07/2014   CLINICAL DATA:  Cough.  EXAM: CHEST  2 VIEW  COMPARISON:  February 13, 2014.  FINDINGS: The heart size and mediastinal contours are within normal limits. Both lungs are  clear. No pneumothorax or pleural effusion is noted. Mild anterior osteophyte formation is noted in mid thoracic spine.  IMPRESSION: No active cardiopulmonary disease.   Electronically Signed   By: Marijo Conception, M.D.   On: 11/07/2014 14:28    Scheduled Meds: . irbesartan  300 mg Oral Daily   And  . amLODipine  10 mg Oral Daily   And  . hydrochlorothiazide  25 mg Oral Daily  . aspirin EC  325 mg Oral Daily  . clopidogrel  75 mg Oral QHS  . heparin  5,000 Units Subcutaneous 3 times per day  . nebivolol  5 mg Oral Daily  . [START ON 11/11/2014] pantoprazole (PROTONIX) IV  40 mg Intravenous Q12H  . polyethylene glycol  17 g Oral Daily  . senna  1 tablet Oral BID  . simvastatin  20 mg Oral QHS  . sodium chloride  3 mL Intravenous Q12H   Continuous Infusions: . sodium chloride Stopped (11/07/14 2045)  . sodium chloride 125 mL/hr at 11/08/14 0932  . pantoprozole (PROTONIX) infusion 8 mg/hr (11/08/14 0931)    Principal Problem:   Elevated troponin Active Problems:   Hyperlipidemia   Coronary artery disease- RCA DES 11/10   Essential hypertension  Renal artery stenosis   Chronic renal insufficiency, stage III (moderate)   GI bleed   HLD (hyperlipidemia)   Abdominal pain   Constipation    Time spent: 87mins    MEMON,JEHANZEB  [facility] Pager [phone]. If 7PM-7AM, please contact night-coverage at [email or web address], password [facility] 11/08/2014, 10:49 AM  LOS: 1 day

## 2014-11-08 NOTE — Consult Note (Signed)
Reason for Consult:Melena Referring Physician:   [name] is an 69 y.o. male.  HPI: Admitted thru the ED yesterday. He c/o upper abdominal pain and passing black stools.  He says for the past 3 days his stools have been black. He c/o tenderness at his umbilicus. Patient is maintained on Plavix and ASA for cardiac and renal stent. Followed by Dr. Quay Burow He says he has had abdominal pain off and on for about a year. He takes MOM to have a BM.  No Pepto Bismol. Appetite has been good. He has weight loss which has been intentional.  No dysphagia. Rarely has heartburn. He says for the last few days he has had sour belch.  He was guaiac positive in the ED.  Slight drop in hemoglobin 11.4 today. 11/07/2014 12.9.  He underwent a colonoscopy in 2012 which revealed: Impression:  10 mm sessile polyp snared from cecum as described above. Polypectomy complete. 2 small polyps ablated via cold biopsy; one from the cecum and second one from the transverse colon. Moderate sigmoid diverticulosis. External hemorrhoids.    CBC Latest Ref Rng 11/08/2014 11/07/2014 02/17/2014  WBC 4.0 - 10.5 K/uL 7.4 9.1 9.2  Hemoglobin 13.0 - 17.0 g/dL 11.4(L) 12.9(L) 12.2(L)  Hematocrit 39.0 - 52.0 % 35.0(L) 39.7 36.2(L)  Platelets 150 - 400 K/uL 183 191 185      Past Medical History  Diagnosis Date  . Coronary artery disease   . Hypertension   . Renal artery stenosis     Multiple interventions  . Status post percutaneous angioplasty of renal artery 10/18 2012    PV angiogram preformed revealing 90% right and 70% left in stent restenosis with 56m and 468mgradients respectively.  . Hyperlipidemia   . Shortness of breath     "at any time" (12/13/2012)    Past Surgical History  Procedure Laterality Date  . Lithotripsy      "laser" (12/13/2012)  . Colonoscopy  12/18/2010    Procedure: COLONOSCOPY;  Surgeon: NaRogene HoustonMD;  Location[facility];  Service: Endoscopy;  Laterality: N/A;  .  Coronary angioplasty with stent placement  03/28/2009    Successful PCI od high-grade 95% eccentric tandem plague stenosis in the distal right coronary artery with ultimate insertion of a 3.0 x 18 mm Xience V DES stent postdilated at 3.46 mm with the 99% stenosis being reduced to 0%  . Cardiac catheterization  03/02/2009    has moderate circumflex and diagonal branch disease with high-grade distal right disease and fail attempt at PCI  . Renal artery stent Bilateral 2010  . Renal artery angioplasty Bilateral 02/2014    PV restenting L RA with an ICast covered stent and cutting balloon atherectomy R RA  . Renal angiogram N/A 12/14/2012    Procedure: RENAL ANGIOGRAM;  Surgeon: JoLorretta HarpMD;  [facility];  Service: Cardiovascular;  Laterality: N/A;  . Percutaneous stent intervention Left 12/14/2012    Procedure: PERCUTANEOUS STENT INTERVENTION;  Surgeon: JoLorretta HarpMD;  [facility];  Service: Cardiovascular;  Laterality: Left;  left renal artery  . Renal angiogram N/A 02/16/2014    Procedure: RENAL ANGIOGRAM;  Surgeon: JoLorretta HarpMD;  [facility];  Service: Cardiovascular;  Laterality: N/A;  . Percutaneous stent intervention  02/16/2014    Procedure: PERCUTANEOUS STENT INTERVENTION;  Surgeon: JoLorretta HarpMD;  [facility];  Service: Cardiovascular;;  left renal    Family History  Problem Relation Age of Onset  . COPD Father 33    deceased    Social History:  reports that he quit smoking about 3 years ago. His smoking use included Cigarettes. He has a 100 pack-year smoking history. He has never used smokeless tobacco. He reports that he does not drink alcohol or use illicit drugs.  Allergies:  Allergies  Allergen Reactions  . Penicillins Rash    Medications: I have reviewed the patient's current medications.  Results for orders placed or performed during the hospital encounter of 11/07/14 (from the past 48 hour(s))  POC occult  blood, ED RN will collect     Status: Abnormal   Collection Time: 11/07/14 12:45 PM  Result Value Ref Range   Fecal Occult Bld POSITIVE (A) NEGATIVE  CBC with Differential     Status: Abnormal   Collection Time: 11/07/14 12:58 PM  Result Value Ref Range   WBC 9.1 4.0 - 10.5 K/uL   RBC 4.34 4.22 - 5.81 MIL/uL   Hemoglobin 12.9 (L) 13.0 - 17.0 g/dL   HCT 39.7 39.0 - 52.0 %   MCV 91.5 78.0 - 100.0 fL   MCH 29.7 26.0 - 34.0 pg   MCHC 32.5 30.0 - 36.0 g/dL   RDW 14.0 11.5 - 15.5 %   Platelets 191 150 - 400 K/uL   Neutrophils Relative % 72 43 - 77 %   Neutro Abs 6.6 1.7 - 7.7 K/uL   Lymphocytes Relative 14 12 - 46 %   Lymphs Abs 1.3 0.7 - 4.0 K/uL   Monocytes Relative 9 3 - 12 %   Monocytes Absolute 0.8 0.1 - 1.0 K/uL   Eosinophils Relative 4 0 - 5 %   Eosinophils Absolute 0.3 0.0 - 0.7 K/uL   Basophils Relative 1 0 - 1 %   Basophils Absolute 0.1 0.0 - 0.1 K/uL  Comprehensive metabolic panel     Status: Abnormal   Collection Time: 11/07/14 12:58 PM  Result Value Ref Range   Sodium 141 135 - 145 mmol/L   Potassium 4.2 3.5 - 5.1 mmol/L   Chloride 107 101 - 111 mmol/L   CO2 26 22 - 32 mmol/L   Glucose, Bld 105 (H) 65 - 99 mg/dL   BUN 51 (H) 6 - 20 mg/dL   Creatinine, Ser 1.97 (H) 0.61 - 1.24 mg/dL   Calcium 8.9 8.9 - 10.3 mg/dL   Total Protein 7.1 6.5 - 8.1 g/dL   Albumin 4.0 3.5 - 5.0 g/dL   AST 14 (L) 15 - 41 U/L   ALT 14 (L) 17 - 63 U/L   Alkaline Phosphatase 55 38 - 126 U/L   Total Bilirubin 0.5 0.3 - 1.2 mg/dL   GFR calc non Af Amer 33 (L) >60 mL/min   GFR calc Af Amer 38 (L) >60 mL/min    Comment: (NOTE) The eGFR has been calculated using the CKD EPI equation. This calculation has not been validated in all clinical situations. eGFR's persistently <60 mL/min signify possible Chronic Kidney Disease.    Anion gap 8 5 - 15  Troponin I     Status: Abnormal   Collection Time: 11/07/14 12:58 PM  Result Value Ref Range   Troponin I 0.11 (H) <0.031 ng/mL    Comment:         PERSISTENTLY INCREASED TROPONIN VALUES IN THE RANGE OF 0.04-0.49 ng/mL CAN BE SEEN IN:       -UNSTABLE ANGINA       -CONGESTIVE HEART FAILURE       -  MYOCARDITIS       -CHEST TRAUMA       -ARRYHTHMIAS       -LATE PRESENTING MYOCARDIAL INFARCTION       -COPD   CLINICAL FOLLOW-UP RECOMMENDED.   Lipase, blood     Status: None   Collection Time: 11/07/14 12:58 PM  Result Value Ref Range   Lipase 24 22 - 51 U/L  Protime-INR     Status: None   Collection Time: 11/07/14  1:00 PM  Result Value Ref Range   Prothrombin Time 15.1 11.6 - 15.2 seconds   INR 1.17 0.00 - 1.49  Urinalysis, Routine w reflex microscopic (not at [facility])     Status: None   Collection Time: 11/07/14  2:08 PM  Result Value Ref Range   Color, Urine YELLOW YELLOW   APPearance CLEAR CLEAR   Specific Gravity, Urine 1.020 1.005 - 1.030   pH 5.5 5.0 - 8.0   Glucose, UA NEGATIVE NEGATIVE mg/dL   Hgb urine dipstick NEGATIVE NEGATIVE   Bilirubin Urine NEGATIVE NEGATIVE   Ketones, ur NEGATIVE NEGATIVE mg/dL   Protein, ur NEGATIVE NEGATIVE mg/dL   Urobilinogen, UA 0.2 0.0 - 1.0 mg/dL   Nitrite NEGATIVE NEGATIVE   Leukocytes, UA NEGATIVE NEGATIVE    Comment: MICROSCOPIC NOT DONE ON URINES WITH NEGATIVE PROTEIN, BLOOD, LEUKOCYTES, NITRITE, OR GLUCOSE <1000 mg/dL.  Type and screen     Status: None   Collection Time: 11/07/14  2:26 PM  Result Value Ref Range   ABO/RH(D) O NEG    Antibody Screen NEG    Sample Expiration 11/10/2014   Troponin I     Status: Abnormal   Collection Time: 11/07/14  5:30 PM  Result Value Ref Range   Troponin I 0.10 (H) <0.031 ng/mL    Comment:        PERSISTENTLY INCREASED TROPONIN VALUES IN THE RANGE OF 0.04-0.49 ng/mL CAN BE SEEN IN:       -UNSTABLE ANGINA       -CONGESTIVE HEART FAILURE       -MYOCARDITIS       -CHEST TRAUMA       -ARRYHTHMIAS       -LATE PRESENTING MYOCARDIAL INFARCTION       -COPD   CLINICAL FOLLOW-UP RECOMMENDED.   Troponin I-serum (0, 3, 6 hours)     Status:  Abnormal   Collection Time: 11/07/14  9:31 PM  Result Value Ref Range   Troponin I 0.11 (H) <0.031 ng/mL    Comment:        PERSISTENTLY INCREASED TROPONIN VALUES IN THE RANGE OF 0.04-0.49 ng/mL CAN BE SEEN IN:       -UNSTABLE ANGINA       -CONGESTIVE HEART FAILURE       -MYOCARDITIS       -CHEST TRAUMA       -ARRYHTHMIAS       -LATE PRESENTING MYOCARDIAL INFARCTION       -COPD   CLINICAL FOLLOW-UP RECOMMENDED.   Troponin I-serum (0, 3, 6 hours)     Status: Abnormal   Collection Time: 11/08/14 12:47 AM  Result Value Ref Range   Troponin I 0.09 (H) <0.031 ng/mL    Comment:        PERSISTENTLY INCREASED TROPONIN VALUES IN THE RANGE OF 0.04-0.49 ng/mL CAN BE SEEN IN:       -UNSTABLE ANGINA       -CONGESTIVE HEART FAILURE       -MYOCARDITIS       -  CHEST TRAUMA       -ARRYHTHMIAS       -LATE PRESENTING MYOCARDIAL INFARCTION       -COPD   CLINICAL FOLLOW-UP RECOMMENDED.   CBC     Status: Abnormal   Collection Time: 11/08/14  4:16 AM  Result Value Ref Range   WBC 7.4 4.0 - 10.5 K/uL   RBC 3.86 (L) 4.22 - 5.81 MIL/uL   Hemoglobin 11.4 (L) 13.0 - 17.0 g/dL   HCT 35.0 (L) 39.0 - 52.0 %   MCV 90.7 78.0 - 100.0 fL   MCH 29.5 26.0 - 34.0 pg   MCHC 32.6 30.0 - 36.0 g/dL   RDW 13.9 11.5 - 15.5 %   Platelets 183 150 - 400 K/uL  Comprehensive metabolic panel     Status: Abnormal   Collection Time: 11/08/14  4:16 AM  Result Value Ref Range   Sodium 140 135 - 145 mmol/L   Potassium 3.8 3.5 - 5.1 mmol/L   Chloride 107 101 - 111 mmol/L   CO2 25 22 - 32 mmol/L   Glucose, Bld 86 65 - 99 mg/dL   BUN 48 (H) 6 - 20 mg/dL   Creatinine, Ser 1.94 (H) 0.61 - 1.24 mg/dL   Calcium 8.3 (L) 8.9 - 10.3 mg/dL   Total Protein 6.0 (L) 6.5 - 8.1 g/dL   Albumin 3.6 3.5 - 5.0 g/dL   AST 12 (L) 15 - 41 U/L   ALT 13 (L) 17 - 63 U/L   Alkaline Phosphatase 42 38 - 126 U/L   Total Bilirubin 0.6 0.3 - 1.2 mg/dL   GFR calc non Af Amer 33 (L) >60 mL/min   GFR calc Af Amer 39 (L) >60 mL/min     Comment: (NOTE) The eGFR has been calculated using the CKD EPI equation. This calculation has not been validated in all clinical situations. eGFR's persistently <60 mL/min signify possible Chronic Kidney Disease.    Anion gap 8 5 - 15  Protime-INR     Status: Abnormal   Collection Time: 11/08/14  4:16 AM  Result Value Ref Range   Prothrombin Time 15.9 (H) 11.6 - 15.2 seconds   INR 1.26 0.00 - 1.49  APTT     Status: None   Collection Time: 11/08/14  4:16 AM  Result Value Ref Range   aPTT 36 24 - 37 seconds  Troponin I     Status: Abnormal   Collection Time: 11/08/14  4:16 AM  Result Value Ref Range   Troponin I 0.10 (H) <0.031 ng/mL    Comment:        PERSISTENTLY INCREASED TROPONIN VALUES IN THE RANGE OF 0.04-0.49 ng/mL CAN BE SEEN IN:       -UNSTABLE ANGINA       -CONGESTIVE HEART FAILURE       -MYOCARDITIS       -CHEST TRAUMA       -ARRYHTHMIAS       -LATE PRESENTING MYOCARDIAL INFARCTION       -COPD   CLINICAL FOLLOW-UP RECOMMENDED.     Ct Abdomen Pelvis Wo Contrast  11/07/2014   CLINICAL DATA:  Dark tower a stools for 5 days. Abdominal pain for 1 week. The renal insufficiency. Fatigue.  EXAM: CT ABDOMEN AND PELVIS WITHOUT CONTRAST  TECHNIQUE: Multidetector CT imaging of the abdomen and pelvis was performed following the standard protocol without IV contrast.  COMPARISON:  11/04/2012  FINDINGS: Lower chest:  Coronary atherosclerosis.  Hepatobiliary: Unremarkable  Pancreas: Unremarkable  Spleen:  Unremarkable  Adrenals/Urinary Tract: 1.6 by 1.9 cm right adrenal mass, -8 Hounsfield units density, compatible with adrenal adenoma.  Right renal atrophy. Hypodense right mid kidney lesion anteriorly, 1.4 cm diameter, internal density -5 Hounsfield units on image 37 series 2. Hypodense left mid to upper kidney lesion 3.5 by 2.8 cm on image 31 series 2, internal density -1 Hounsfield unit.  Slight asymmetry with the left ureter more prominent than the right, but possibly related to the  fact that the right kidney is more atrophic than the left. No stones or overt obstruction. Urinary bladder unremarkable.  Stomach/Bowel: Sigmoid diverticulosis.  Vascular/Lymphatic: Aortoiliac atherosclerotic vascular disease. Bilateral renal artery stenting.  Reproductive: Prominent prostate gland, 6.8 by 5.0 cm on image 80 series 2. Calcifications along the lower prostate gland along with unusual horseshoe shaped fluid density just below the prostate gland with marginal calcifications, of uncertain significance.  Other: Subcutaneous edema adjacent to the umbilicus and extending caudad, possibly inflammatory.  Musculoskeletal: Degenerative arthropathy of both hips. Old left lower rib deformities from prior fractures.  3 mm grade 1 retrolisthesis at L1-2 with multilevel loss of intervertebral disc height and multilevel spurring. Disc bulges at L3-4 and L4-5. Lumbar spondylosis and degenerative disc disease lead to suspected impingement at L3-4, L4-5, and L5-S1.  IMPRESSION: 1. A specific cause for the patient' s presumed melena is not seen, although the patient does have sigmoid diverticulosis without active diverticulitis identified. 2. Right renal atrophy.  Bilateral renal cysts. 3. Right adrenal adenoma. 4. Coronary artery atherosclerosis. Aortoiliac atherosclerotic vascular disease. 5. Subcutaneous stranding below the umbilicus, possibly reflect low grade subcutaneous inflammation. 6. Lumbar spondylosis and degenerative disc disease cause multilevel impingement. 7. Degenerative arthropathy of both hips.   Electronically Signed   By: Van Clines M.D.   On: 11/07/2014 17:08   Dg Chest 2 View  11/07/2014   CLINICAL DATA:  Cough.  EXAM: CHEST  2 VIEW  COMPARISON:  February 13, 2014.  FINDINGS: The heart size and mediastinal contours are within normal limits. Both lungs are clear. No pneumothorax or pleural effusion is noted. Mild anterior osteophyte formation is noted in mid thoracic spine.  IMPRESSION: No  active cardiopulmonary disease.   Electronically Signed   By: Marijo Conception, M.D.   On: 11/07/2014 14:28    Review of Systems  Neurological: Positive for weakness.   Blood pressure 119/31, pulse 57, temperature 98.5 F (36.9 C), temperature source Oral, resp. rate 20, height '5\' 11"'  (1.803 m), weight 231 lb 3.2 oz (104.872 kg), SpO2 97 %. Physical Exam  Alert and oriented. Skin warm and dry. Oral mucosa is moist.   . Sclera anicteric, conjunctivae is pink. Thyroid not enlarged. No cervical lymphadenopathy. Lungs clear. Heart regular rate and rhythm.  Abdomen is soft. Bowel sounds are positive. No hepatomegaly. No abdominal masses felt.  Tenderness mid abdomen around umblicus  No edema to lower extremities.     Assessment/Plan: Melena.  Patient maintained on Plavix and ASA. PUD needs to be ruled out. Patient has elevated troponin's. Last Plavix was 11/07/2014 pm.  Agree with Protonix BID Will discuss with Dr. Laural Golden.   SETZER,TERRI W 11/08/2014, 7:45 AM   GI attending note; Patient interviewed and examined. Lab studies reviewed. Patient is 69 year old Caucasian male with known coronary artery disease who has cautery and renal stent and is maintained on Plavix and full dose aspirin who presents with one month history of upper and lower abdominal pain and three-day history of melena without nausea vomiting or  weight loss. Hemoglobin has dropped by 1.5 g. She has mildly liver troponin levels felt to be due to kidney disease and not indicative of myocardial injury according to Dr. Roderic Palau. Abdominal exam reveals mild tenderness in midepigastrium in hypogastric region. Patient possibly has peptic ulcer disease or he could also be bleeding from small bowel. She and is agreeable to proceed with diagnostic EGD. This study would be performed later today.

## 2014-11-08 NOTE — Progress Notes (Signed)
UR chart review completed.  

## 2014-11-09 DIAGNOSIS — K922 Gastrointestinal hemorrhage, unspecified: Secondary | ICD-10-CM | POA: Insufficient documentation

## 2014-11-09 DIAGNOSIS — N189 Chronic kidney disease, unspecified: Secondary | ICD-10-CM

## 2014-11-09 LAB — BASIC METABOLIC PANEL
Anion gap: 6 (ref 5–15)
BUN: 37 mg/dL — ABNORMAL HIGH (ref 6–20)
CALCIUM: 8.1 mg/dL — AB (ref 8.9–10.3)
CO2: 25 mmol/L (ref 22–32)
Chloride: 108 mmol/L (ref 101–111)
Creatinine, Ser: 1.89 mg/dL — ABNORMAL HIGH (ref 0.61–1.24)
GFR calc Af Amer: 40 mL/min — ABNORMAL LOW (ref >60)
GFR calc non Af Amer: 34 mL/min — ABNORMAL LOW (ref >60)
Glucose, Bld: 95 mg/dL (ref 65–99)
Potassium: 4.2 mmol/L (ref 3.5–5.1)
Sodium: 139 mmol/L (ref 135–145)

## 2014-11-09 LAB — CBC
HCT: 32.4 % — ABNORMAL LOW (ref 39.0–52.0)
HEMOGLOBIN: 10.5 g/dL — AB (ref 13.0–17.0)
MCH: 29.7 pg (ref 26.0–34.0)
MCHC: 32.4 g/dL (ref 30.0–36.0)
MCV: 91.5 fL (ref 78.0–100.0)
PLATELETS: 170 10*3/uL (ref 150–400)
RBC: 3.54 MIL/uL — AB (ref 4.22–5.81)
RDW: 13.9 % (ref 11.5–15.5)
WBC: 6.1 10*3/uL (ref 4.0–10.5)

## 2014-11-09 MED ORDER — ASPIRIN EC 81 MG PO TBEC: 81.0000 mg | DELAYED_RELEASE_TABLET | Freq: Every day | ORAL | Status: DC

## 2014-11-09 MED ORDER — PANTOPRAZOLE SODIUM 40 MG PO TBEC: 40.0000 mg | DELAYED_RELEASE_TABLET | Freq: Every day | ORAL | Status: DC

## 2014-11-09 MED ORDER — RANITIDINE HCL 150 MG PO TABS: 150.0000 mg | ORAL_TABLET | Freq: Every day | ORAL | Status: DC

## 2014-11-09 MED ORDER — PANTOPRAZOLE SODIUM 40 MG PO TBEC
40.0000 mg | DELAYED_RELEASE_TABLET | Freq: Two times a day (BID) | ORAL | Status: DC
  Administered 2014-11-09: 40 mg via ORAL
  Filled 2014-11-09: qty 1

## 2014-11-09 MED ORDER — SUCRALFATE 1 GM/10ML PO SUSP: 1.0000 g | Freq: Three times a day (TID) | ORAL | Status: DC

## 2014-11-09 MED ORDER — AMLODIPINE BESYLATE 10 MG PO TABS
10.0000 mg | ORAL_TABLET | Freq: Every day | ORAL | Status: DC
Start: 2014-11-09 — End: 2015-03-15

## 2014-11-09 NOTE — Progress Notes (Signed)
Patient has no complaints. He denies abdominal pain.  Stool is still tarry. He denies rectal bleeding. Abdomen is soft and nontender without organomegaly or masses. H. pylori serology is pending.  Assessment;  Upper GI bleed secondary to gastric ulcers. Hemoglobin has dropped by 2-1/2 g since admission. He is tolerating a heart healthy diet. Agree with discharge planning. Since patient is on Plavix he will be on pantoprazole in a.m. and H2 B in PM. Continue sucralfate for 2 weeks. Resume aspirin on low-dose in one week. Patient will have H&H in 2 weeks. My office will call him. We will see him in the office in 3-4 weeks.

## 2014-11-09 NOTE — Care Management Note (Signed)
Case Management Note  Patient Details  Name: DARICO VILLALONA MRN: [id number] Date of Birth: Jan 25, 1946  Subjective/Objective:                    Action/Plan:   Expected Discharge Date:                  Expected Discharge Plan:  Home/Self Care  In-House Referral:  NA  Discharge planning Services  CM Consult  Post Acute Care Choice:  NA Choice offered to:  NA  DME Arranged:    DME Agency:     HH Arranged:    HH Agency:     Status of Service:  Completed, signed off  Medicare Important Message Given:    Date Medicare IM Given:    Medicare IM give by:    Date Additional Medicare IM Given:    Additional Medicare Important Message give by:     If discussed at [place] of Stay Meetings, dates discussed:    Additional Comments: Pt discharged home today. No CM needs noted. Christinia Gully [place], RN 11/09/2014, 3:24 PM

## 2014-11-09 NOTE — Discharge Summary (Addendum)
Physician Discharge Summary  AYCE BOMBARA [id number] DOB: 23-Nov-1945 DOA: 11/07/2014  PCP: Rocky Morel, MD  Admit date: 11/07/2014 Discharge date: 11/09/2014  Time spent: 40 minutes  Recommendations for Outpatient Follow-up:  1. Follow-up with GI in 2 weeks for repeat H&H 2. Follow-up with cardiology in 1-2 weeks  Discharge Diagnoses:  Principal Problem:   Elevated troponin Active Problems:   Hyperlipidemia   Coronary artery disease- RCA DES 11/10   Essential hypertension   Renal artery stenosis   Chronic renal insufficiency, stage III (moderate)   GI bleed   HLD (hyperlipidemia)   Abdominal pain   Constipation   CKD (chronic kidney disease) stage 3, GFR 30-59 ml/min   Anemia   Bleeding gastrointestinal   Discharge Condition: Improved  Diet recommendation: Low-salt  Filed Weights   11/07/14 1215 11/08/14 0616  Weight: 106.142 kg (234 lb) 104.872 kg (231 lb 3.2 oz)    History of present illness:  This patient presents to the hospital with complaints of generalized abdominal pain as well as dark tarry stools. He was found to have GI bleeding and was admitted to the hospital for further treatments.  Hospital Course:  1. GI bleeding. Likely upper GI bleed. Hemoglobin mildly declined since admission, the patient did not require blood transfusion. GI was following. He underwent EGD with results noted as below. He will stay on daily Protonix and H2 blockers in the evening. He has also been placed on a 2 week course of Carafate. Follow up with GI as an outpatient 2. Demand ischemia. Troponins only mildly elevated in the setting of elevated creatinine and GI bleeding. He has not had any typical chest pain. EKG was unremarkable. Discussed with Dr. Domenic Polite and no further work up at this time. 3. AKI on CKD stage III. Baseline creatinine around 1.3. Currently, creatinine is 1.89. We will continue to hold ARB/hydrochlorothiazide at this time. 4. History of renal  artery stenosis. prior stent placement in the past. Follow-up with Dr. Gwenlyn Found. 5. Anemia. Mild anemia in the setting of GI bleeding, likely related to blood loss. Continue to monitor. 6. Hypertension. Stable. Continue current treatments. 7. Hyperlipidemia. Continue statin. 8. Coronary artery disease with stents placed in the past. Reports his last stress test was done in 2010. Would recommend follow-up with cardiology for further management. He was continued on his Plavix, but aspirin was held for now. He's been advised to resume baby aspirin one week.  Procedures: 11/08/14 [id number] sliding hiatal hernia. 3 x 5 mm antral ulcer with clean base. 4 x 69mm antral ulcer with tiny speck of pigment suspected to be source of bleeding but no active bleeding identified. No therapy rendered.  Antral gastritis.  Consultations:  Gastroenterology  Discharge Exam: Filed Vitals:   11/09/14 1441  BP: 137/44  Pulse: 63  Temp: 98 F (36.7 C)  Resp: 16    General: No acute distress Cardiovascular: S1, S2, regular rate and rhythm Respiratory: Clear to auscultation bilaterally  Discharge Instructions   Discharge Instructions    Diet - low sodium heart healthy    Complete by:  As directed      Increase activity slowly    Complete by:  As directed           Discharge Medication List as of 11/09/2014  3:15 PM    START taking these medications   Details  amLODipine (NORVASC) 10 MG tablet Take 1 tablet (10 mg total) by mouth daily., Starting 11/09/2014, Until Discontinued, Print  pantoprazole (PROTONIX) 40 MG tablet Take 1 tablet (40 mg total) by mouth daily., Starting 11/09/2014, Until Discontinued, Print    ranitidine (ZANTAC) 150 MG tablet Take 1 tablet (150 mg total) by mouth at bedtime., Starting 11/09/2014, Until Discontinued, Print    sucralfate (CARAFATE) 1 GM/10ML suspension Take 10 mLs (1 g total) by mouth 4 (four) times daily -  with meals and at bedtime., Starting 11/09/2014, Until  Discontinued, Print      CONTINUE these medications which have CHANGED   Details  aspirin EC 81 MG tablet Take 1 tablet (81 mg total) by mouth daily. Start on 11/15/14, Starting 11/09/2014, Until Discontinued, Print      CONTINUE these medications which have NOT CHANGED   Details  acetaminophen (TYLENOL) 500 MG tablet Take 500 mg by mouth every 6 (six) hours as needed for moderate pain., Until Discontinued, Historical Med    cholecalciferol (VITAMIN D) 1000 UNITS tablet Take 1,000 Units by mouth daily., Until Discontinued, Historical Med    clobetasol cream (TEMOVATE) [id number] % Apply 1 application topically daily as needed (Psoriasis)., Until Discontinued, Historical Med    clopidogrel (PLAVIX) 75 MG tablet Take 1 tablet (75 mg total) by mouth at bedtime., Starting 09/11/2014, Until Discontinued, Normal    nebivolol (BYSTOLIC) 5 MG tablet Take 1 tablet (5 mg total) by mouth daily., Starting 09/11/2014, Until Discontinued, Normal    nitroGLYCERIN (NITROSTAT) 0.4 MG SL tablet Place 0.4 mg under the tongue every 5 (five) minutes as needed for chest pain. , Until Discontinued, Historical Med    simvastatin (ZOCOR) 20 MG tablet Take 1 tablet (20 mg total) by mouth at bedtime., Starting 09/11/2014, Until Discontinued, Normal      STOP taking these medications     Olmesartan-Amlodipine-HCTZ (TRIBENZOR) 40-10-25 MG TABS        Allergies  Allergen Reactions  . Penicillins Rash      The results of significant diagnostics from this hospitalization (including imaging, microbiology, ancillary and laboratory) are listed below for reference.    Significant Diagnostic Studies: Ct Abdomen Pelvis Wo Contrast  11/07/2014   CLINICAL DATA:  Dark tower a stools for 5 days. Abdominal pain for 1 week. The renal insufficiency. Fatigue.  EXAM: CT ABDOMEN AND PELVIS WITHOUT CONTRAST  TECHNIQUE: Multidetector CT imaging of the abdomen and pelvis was performed following the standard protocol without IV contrast.   COMPARISON:  11/04/2012  FINDINGS: Lower chest:  Coronary atherosclerosis.  Hepatobiliary: Unremarkable  Pancreas: Unremarkable  Spleen: Unremarkable  Adrenals/Urinary Tract: 1.6 by 1.9 cm right adrenal mass, -8 Hounsfield units density, compatible with adrenal adenoma.  Right renal atrophy. Hypodense right mid kidney lesion anteriorly, 1.4 cm diameter, internal density -5 Hounsfield units on image 37 series 2. Hypodense left mid to upper kidney lesion 3.5 by 2.8 cm on image 31 series 2, internal density -1 Hounsfield unit.  Slight asymmetry with the left ureter more prominent than the right, but possibly related to the fact that the right kidney is more atrophic than the left. No stones or overt obstruction. Urinary bladder unremarkable.  Stomach/Bowel: Sigmoid diverticulosis.  Vascular/Lymphatic: Aortoiliac atherosclerotic vascular disease. Bilateral renal artery stenting.  Reproductive: Prominent prostate gland, 6.8 by 5.0 cm on image 80 series 2. Calcifications along the lower prostate gland along with unusual horseshoe shaped fluid density just below the prostate gland with marginal calcifications, of uncertain significance.  Other: Subcutaneous edema adjacent to the umbilicus and extending caudad, possibly inflammatory.  Musculoskeletal: Degenerative arthropathy of both hips. Old left lower rib  deformities from prior fractures.  3 mm grade 1 retrolisthesis at L1-2 with multilevel loss of intervertebral disc height and multilevel spurring. Disc bulges at L3-4 and L4-5. Lumbar spondylosis and degenerative disc disease lead to suspected impingement at L3-4, L4-5, and L5-S1.  IMPRESSION: 1. A specific cause for the patient' s presumed melena is not seen, although the patient does have sigmoid diverticulosis without active diverticulitis identified. 2. Right renal atrophy.  Bilateral renal cysts. 3. Right adrenal adenoma. 4. Coronary artery atherosclerosis. Aortoiliac atherosclerotic vascular disease. 5.  Subcutaneous stranding below the umbilicus, possibly reflect low grade subcutaneous inflammation. 6. Lumbar spondylosis and degenerative disc disease cause multilevel impingement. 7. Degenerative arthropathy of both hips.   Electronically Signed   By: Van Clines M.D.   On: 11/07/2014 17:08   Dg Chest 2 View  11/07/2014   CLINICAL DATA:  Cough.  EXAM: CHEST  2 VIEW  COMPARISON:  February 13, 2014.  FINDINGS: The heart size and mediastinal contours are within normal limits. Both lungs are clear. No pneumothorax or pleural effusion is noted. Mild anterior osteophyte formation is noted in mid thoracic spine.  IMPRESSION: No active cardiopulmonary disease.   Electronically Signed   By: Marijo Conception, M.D.   On: 11/07/2014 14:28    Microbiology: No results found for this or any previous visit (from the past 240 hour(s)).   Labs: Basic Metabolic Panel:  Recent Labs Lab 11/07/14 1258 11/08/14 0416 11/09/14 0612  NA 141 140 139  K 4.2 3.8 4.2  CL 107 107 108  CO2 26 25 25   GLUCOSE 105* 86 95  BUN 51* 48* 37*  CREATININE 1.97* 1.94* 1.89*  CALCIUM 8.9 8.3* 8.1*   Liver Function Tests:  Recent Labs Lab 11/07/14 1258 11/08/14 0416  AST 14* 12*  ALT 14* 13*  ALKPHOS 55 42  BILITOT 0.5 0.6  PROT 7.1 6.0*  ALBUMIN 4.0 3.6    Recent Labs Lab 11/07/14 1258  LIPASE 24   No results for input(s): AMMONIA in the last 168 hours. CBC:  Recent Labs Lab 11/07/14 1258 11/08/14 0416 11/09/14 0612  WBC 9.1 7.4 6.1  NEUTROABS 6.6  --   --   HGB 12.9* 11.4* 10.5*  HCT 39.7 35.0* 32.4*  MCV 91.5 90.7 91.5  PLT 191 183 170   Cardiac Enzymes:  Recent Labs Lab 11/07/14 1258 11/07/14 1730 11/07/14 2131 11/08/14 0047 11/08/14 0416  TROPONINI 0.11* 0.10* 0.11* 0.09* 0.10*   BNP: BNP (last 3 results) No results for input(s): BNP in the last 8760 hours.  ProBNP (last 3 results) No results for input(s): PROBNP in the last 8760 hours.  CBG: No results for input(s):  GLUCAP in the last 168 hours.     Signed:  Ludie Pavlik  [facility] 11/09/2014, 7:43 PM

## 2014-11-09 NOTE — Progress Notes (Signed)
Patient ID: Steven Scott, male   DOB: 06-30-1945, 69 y.o.   MRN: [id number] States he feels better. One black stool last night.  Appetite is good. Says he feels 100% better. CBC    Component Value Date/Time   WBC 6.1 11/09/2014 0612   RBC 3.54* 11/09/2014 0612   HGB 10.5* 11/09/2014 0612   HCT 32.4* 11/09/2014 0612   PLT 170 11/09/2014 0612   MCV 91.5 11/09/2014 0612   MCH 29.7 11/09/2014 0612   MCHC 32.4 11/09/2014 0612   RDW 13.9 11/09/2014 0612   LYMPHSABS 1.3 11/07/2014 1258   MONOABS 0.8 11/07/2014 1258   EOSABS 0.3 11/07/2014 1258   BASOSABS 0.1 11/07/2014 1258   Upper GI bleed, probably NSAID induced. H. Pylori pending. Will need OV 3-4 weeks.

## 2014-11-09 NOTE — Progress Notes (Signed)
Patient with orders to be discharge home. Discharge instructions given, patient verbalized understanding. Patient stable. Prescriptions faxed to [facility]. Patient left in private vehicle with family.

## 2014-11-10 ENCOUNTER — Encounter (HOSPITAL_COMMUNITY): Payer: Self-pay | Admitting: Internal Medicine

## 2014-11-10 LAB — H. PYLORI ANTIBODY, IGG: H PYLORI IGG: 5.8 U/mL — AB (ref 0.0–0.8)

## 2014-11-14 ENCOUNTER — Telehealth: Payer: Self-pay | Admitting: *Deleted

## 2014-11-14 ENCOUNTER — Telehealth (INDEPENDENT_AMBULATORY_CARE_PROVIDER_SITE_OTHER): Payer: Self-pay | Admitting: *Deleted

## 2014-11-14 DIAGNOSIS — Z8719 Personal history of other diseases of the digestive system: Secondary | ICD-10-CM

## 2014-11-14 NOTE — Telephone Encounter (Signed)
I called patient and made him an appt for 7/13 with Dr Gwenlyn Found

## 2014-11-14 NOTE — Telephone Encounter (Signed)
-----   Message from [name], MD sent at 11/10/2014  7:51 AM EDT ----- Steven Scott, can we arrange a ROV with me for this patient.  JJB ----- Message -----    From: Kathie Dike, MD    Sent: 11/09/2014   7:49 PM      To: [name], MD  Dr. Gwenlyn Found,  Steven Scott was admitted to [facility] with GI bleeding. He is chronically on full dose aspirin and Plavix for cardiac stents as well as renal artery stents that you had placed in the past. Per GI recommendations, we have held the aspirin for 1 week and will resume lower dose aspirin 81 mg next week. He's been continued on Plavix. He did have some demand ischemia during his hospital stay, but no significant chest pain or EKG changes. His last stress test was several years ago. I recommended close follow-up with you.  He was also noted to have an elevation of his creatinine from baseline of 1.3-1.8. I will continue to hold ARB and hydrochlorothiazide. Just letting you know since you're managing his renal artery stenosis. If you can please ensure that your office follows up with him.  Thanks.  [employer] [facility]

## 2014-11-14 NOTE — Telephone Encounter (Signed)
Per Dr.Rehman the patient will need to have labs drawn on 11/20/14, Office Visit on 11/21/14.

## 2014-11-20 DIAGNOSIS — Z8719 Personal history of other diseases of the digestive system: Secondary | ICD-10-CM | POA: Diagnosis not present

## 2014-11-20 LAB — CBC
HCT: 34.8 % — ABNORMAL LOW (ref 39.0–52.0)
HEMOGLOBIN: 11.3 g/dL — AB (ref 13.0–17.0)
MCH: 29.3 pg (ref 26.0–34.0)
MCHC: 32.5 g/dL (ref 30.0–36.0)
MCV: 90.2 fL (ref 78.0–100.0)
MPV: 11.5 fL (ref 8.6–12.4)
Platelets: 264 10*3/uL (ref 150–400)
RBC: 3.86 MIL/uL — AB (ref 4.22–5.81)
RDW: 14.8 % (ref 11.5–15.5)
WBC: 10.7 10*3/uL — ABNORMAL HIGH (ref 4.0–10.5)

## 2014-11-21 ENCOUNTER — Ambulatory Visit (INDEPENDENT_AMBULATORY_CARE_PROVIDER_SITE_OTHER): Payer: Medicare Other | Admitting: Internal Medicine

## 2014-11-21 ENCOUNTER — Encounter (INDEPENDENT_AMBULATORY_CARE_PROVIDER_SITE_OTHER): Payer: Self-pay | Admitting: Internal Medicine

## 2014-11-21 VITALS — BP 144/62 | HR 60 | Temp 98.8°F | Ht 71.0 in | Wt 232.5 lb

## 2014-11-21 DIAGNOSIS — K279 Peptic ulcer, site unspecified, unspecified as acute or chronic, without hemorrhage or perforation: Secondary | ICD-10-CM

## 2014-11-21 DIAGNOSIS — B369 Superficial mycosis, unspecified: Secondary | ICD-10-CM | POA: Diagnosis not present

## 2014-11-21 DIAGNOSIS — B9681 Helicobacter pylori [H. pylori] as the cause of diseases classified elsewhere: Secondary | ICD-10-CM

## 2014-11-21 DIAGNOSIS — D62 Acute posthemorrhagic anemia: Secondary | ICD-10-CM | POA: Diagnosis not present

## 2014-11-21 DIAGNOSIS — A048 Other specified bacterial intestinal infections: Secondary | ICD-10-CM

## 2014-11-21 DIAGNOSIS — I701 Atherosclerosis of renal artery: Secondary | ICD-10-CM | POA: Diagnosis not present

## 2014-11-21 MED ORDER — NYSTATIN 100000 UNIT/GM EX CREA: 1.0000 "application " | TOPICAL_CREAM | Freq: Two times a day (BID) | CUTANEOUS | Status: DC

## 2014-11-21 MED ORDER — BIS SUBCIT-METRONID-TETRACYC 140-125-125 MG PO CAPS: 3.0000 | ORAL_CAPSULE | Freq: Three times a day (TID) | ORAL | Status: DC

## 2014-11-21 NOTE — Patient Instructions (Addendum)
Call if you experience side-effects with Pylera. CBC in one month.

## 2014-11-21 NOTE — Progress Notes (Signed)
Presenting complaint;  Followup for complicated peptic ulcer disease and anemia.  Database:  Patient is 69 year old Caucasian male who was hospitalized about 2 weeks ago at [facility] for anemia and melena. He was also experiencing epigastric pain. Patient is on Plavix and was on full dose aspirin. EGD revealed 2 gastric ulcers and H. pylori serology came back positive. Patient's hemoglobin dropped to 10.5 but he did not require transfusion. Patient has been maintained on Plavix and was advised to go back on low-dose aspirin week later. Patient was discharged in pantoprazole in the morning and Zantac in the evening in order to minimize interaction with Plavix. He now returns for follow-up visit.  Subjective:  Patient has no complaints other than recent onset of rash across his lower abdomen and groin. He took Benadryl last night which helped with itching. He states epigastric pain has resolved in stool is not black anymore. He is having formed stool daily. Appetite is good. He is not weak anymore. He has an appointment to see Dr. Quay Burow tomorrow to reassess antiplatelet therapy.   Current Medications: Outpatient Encounter Prescriptions as of 11/21/2014  Medication Sig  . acetaminophen (TYLENOL) 500 MG tablet Take 500 mg by mouth every 6 (six) hours as needed for moderate pain.  Marland Kitchen amLODipine (NORVASC) 10 MG tablet Take 1 tablet (10 mg total) by mouth daily.  Marland Kitchen aspirin EC 81 MG tablet Take 1 tablet (81 mg total) by mouth daily. Start on 11/15/14  . cholecalciferol (VITAMIN D) 1000 UNITS tablet Take 1,000 Units by mouth daily.  . clobetasol cream (TEMOVATE) [id number] % Apply 1 application topically daily as needed (Psoriasis).  . clopidogrel (PLAVIX) 75 MG tablet Take 1 tablet (75 mg total) by mouth at bedtime.  . nebivolol (BYSTOLIC) 5 MG tablet Take 1 tablet (5 mg total) by mouth daily.  . nitroGLYCERIN (NITROSTAT) 0.4 MG SL tablet Place 0.4 mg under the tongue every 5 (five) minutes as needed  for chest pain.   . pantoprazole (PROTONIX) 40 MG tablet Take 1 tablet (40 mg total) by mouth daily.  . ranitidine (ZANTAC) 150 MG tablet Take 1 tablet (150 mg total) by mouth at bedtime.  . simvastatin (ZOCOR) 20 MG tablet Take 1 tablet (20 mg total) by mouth at bedtime.  . [DISCONTINUED] sucralfate (CARAFATE) 1 GM/10ML suspension Take 10 mLs (1 g total) by mouth 4 (four) times daily -  with meals and at bedtime. (Patient not taking: Reported on 11/21/2014)   No facility-administered encounter medications on file as of 11/21/2014.    Objective: Blood pressure 144/62, pulse 60, temperature 98.8 F (37.1 C), height 5\' 11"  (1.803 m), weight 232 lb 8 oz (105.461 kg). Patient is alert and in no acute distress. Conjunctiva is pink. Sclera is nonicteric Oropharyngeal mucosa is normal. No neck masses or thyromegaly noted. Cardiac exam with regular rhythm normal S1 and S2. No murmur or gallop noted. Lungs are clear to auscultation. Abdomen is protuberant. Erythematous scaly rash noted across lower abdomen along the crease line and also in inguinal areas. Abdomen is soft and nontender without organomegaly or masses. No LE edema or clubbing noted.  Labs/studies Results:   Recent Labs  11/20/14 0900  WBC 10.7*  HGB 11.3*  HCT 34.8*  PLT 264     H. pylori serology positive.  Assessment:  #1. Peptic ulcer disease. Etiology felt to be combination of H. pylori infection and injury secondary to aspirin and Plavix. He seems to be tolerating low-dose aspirin with Plavix without any side  effects. He will need follow-up EGD in 12 weeks to document complete healing of these ulcers. #2. Anemia secondary to recent GI bleed. H&H is coming up. #3. H pylori infection. #4. Fungal skin infection involving lower abdomen and both inguinal areas.   Plan:  CBC in one month. Pylera 3 capsules by mouth 4 times a day for 10 days. Patient informed that his stools will turn black while he is on Pylera he  should not be alarmed. Will continue pantoprazole 40 mg by mouth every morning and Zantac OTC 150 mg by mouth every evening. H. pylori stool antigen to document eradication and infection in 6-8 weeks. Mycostatin cream to be applied to areas with rash twice daily until rash resolved. Patient advised to keep these areas moisture free. Esophagogastroduodenoscopy in 12 weeks.

## 2014-11-22 ENCOUNTER — Ambulatory Visit (INDEPENDENT_AMBULATORY_CARE_PROVIDER_SITE_OTHER): Payer: Medicare Other | Admitting: Cardiovascular Disease

## 2014-11-22 ENCOUNTER — Encounter: Payer: Self-pay | Admitting: Cardiovascular Disease

## 2014-11-22 ENCOUNTER — Telehealth (INDEPENDENT_AMBULATORY_CARE_PROVIDER_SITE_OTHER): Payer: Self-pay | Admitting: *Deleted

## 2014-11-22 VITALS — BP 160/62 | HR 68 | Ht 71.0 in | Wt 233.4 lb

## 2014-11-22 DIAGNOSIS — R0989 Other specified symptoms and signs involving the circulatory and respiratory systems: Secondary | ICD-10-CM

## 2014-11-22 DIAGNOSIS — I2510 Atherosclerotic heart disease of native coronary artery without angina pectoris: Secondary | ICD-10-CM

## 2014-11-22 DIAGNOSIS — Z79899 Other long term (current) drug therapy: Secondary | ICD-10-CM

## 2014-11-22 DIAGNOSIS — I10 Essential (primary) hypertension: Secondary | ICD-10-CM

## 2014-11-22 DIAGNOSIS — I2583 Coronary atherosclerosis due to lipid rich plaque: Secondary | ICD-10-CM

## 2014-11-22 DIAGNOSIS — I701 Atherosclerosis of renal artery: Secondary | ICD-10-CM

## 2014-11-22 DIAGNOSIS — E785 Hyperlipidemia, unspecified: Secondary | ICD-10-CM

## 2014-11-22 NOTE — Patient Instructions (Signed)
Medication Instructions:  Dr Gwenlyn Found has made no changes in your current medications or treatment plan.  Labwork: Your physician recommends that you return for lab work at your earliest [place].  Testing/Procedures: Your physician has requested that you have a carotid duplex on the same day as your renal doppler. This test is an ultrasound of the carotid arteries in your neck. It looks at blood flow through these arteries that supply the brain with blood. Allow one hour for this exam. There are no restrictions or special instructions.  Follow-Up: Dr Gwenlyn Found recommends that you schedule a follow-up appointment in 1 year. You will receive a reminder letter in the mail two months in advance. If you don't receive a letter, please call our office to schedule the follow-up appointment.

## 2014-11-22 NOTE — Assessment & Plan Note (Signed)
History of coronary artery disease status post RCA intervention by Dr. Georgina Peer 03/28/08 with a [place] struggling stent. He has had no recurrent symptoms. He was unresponsive GI bleed and had load level troponins which I think were related to demand ischemia.

## 2014-11-22 NOTE — Assessment & Plan Note (Signed)
History of bilateral renal artery stenosis status post remote PT and stenting with 3 intervention using cutting balloon atherectomy for in-stent restenosis in September 2012. I then restented his left renal artery with an ICast  covered stent 12/14/12. That time he had 67% ostial stenosis of the right renal artery had we've been following him by duplex ultrasound. The most recent Doppler performed 3 months ago showed a widely patent left renal stent. The right renal artery could not be visualized because of overlying bowel gas.

## 2014-11-22 NOTE — Assessment & Plan Note (Signed)
History of hypertension with blood pressure measured at 160/62. He is on diastolic and amlodipine. Continue current meds at current dosing

## 2014-11-22 NOTE — Assessment & Plan Note (Signed)
History of hyperlipidemia on simvastatin 20 mg a day. We'll recheck a lipid and liver profile

## 2014-11-22 NOTE — Progress Notes (Signed)
11/22/2014 [facility] TOENNIES   05/31/1945  [id number]  Primary Physician Rocky Morel, MD Primary Cardiologist: [name] MD [name]   HPI:  The patient is a very pleasant 69 year old, moderately overweight, married Caucasian male, father of 2 who I last saw in the office 2 months ago. He has a history of persistent hypertension status post bilateral renal artery PTA and stenting with a positive Myoview, as well as heart catheterization that showed a high-grade distal RCA stenosis. I stented both his renal arteries successfully but was unable to cross his distal RCA, which Dr. Claiborne Billings subsequently performed successfully on March 28, 2008, with a Xience V drug-eluting stent with an excellent result. He clinically improved. He had an excellent lipid profile. Renal Dopplers performed January 31, 2011, showed progression of disease in both renal arteries suggesting high "in-stent restenosis." I performed angiography on him October 18 revealing 90% right and 70% left in-stent restenosis with 60 and 40-mm gradients, respectively. He underwent AngioSculpt cutting balloon atherectomy of both renal arteries with an excellent angiographic and followup Doppler result. His blood pressure has remained stable. He is otherwise asymptomatic. He was changed to generic antihypertensive medications off of Tribenzor and apparently his blood pressures have been more difficult to control since that time. He does admit to dietary indiscretion with regards to salt as well.  I saw Mr. Krupa 04/19/12. He denies chest pain or shortness of breath. Apparently his creatinine has slowly increased now to close to 2. Recent renal Dopplers performed in our office 11/19/12 suggest rapid progression of the in-stent restenosis and throat within the left renal artery stent with a renal/aortic ratio of 7.33. Based on this, I re-angiogram him on 12/14/12 revealing an 80-90% mid in-stent restenosis" within the  left renal artery stent which I restented with a ICast covered stent. His right renal artery had 60-70% ostial stenosis at that time. I performed cutting balloon angioplasty on the right renal artery in-stent restenosis.Followup Dopplers performed last month revealed normalization of his left renal artery to aortic ratio with mild progression of disease on the right. Since I saw him last recent Dopplers performed last month showed pulses parvus and tardus in the mid and distal right renal artery. The ostium was unable to be scan because of overlying bowel gas.he was recently hospitalized several weeks ago with a GI bleed and abdominal pain. He underwent endoscopy and was found to have duodenal ulcers. His troponins were minimally elevated probably related to demand ischemia but he denies chest pain or shortness of breath and   Current Outpatient Prescriptions  Medication Sig Dispense Refill  . acetaminophen (TYLENOL) 500 MG tablet Take 500 mg by mouth every 6 (six) hours as needed for moderate pain.    Marland Kitchen amLODipine (NORVASC) 10 MG tablet Take 1 tablet (10 mg total) by mouth daily. 30 tablet 1  . aspirin EC 81 MG tablet Take 1 tablet (81 mg total) by mouth daily. Start on 11/15/14 30 tablet 1  . bismuth-metronidazole-tetracycline (PYLERA) 140-125-125 MG per capsule Take 3 capsules by mouth 4 (four) times daily -  before meals and at bedtime. 120 capsule 0  . cholecalciferol (VITAMIN D) 1000 UNITS tablet Take 1,000 Units by mouth daily.    . clobetasol cream (TEMOVATE) AB-123456789 % Apply 1 application topically daily as needed (Psoriasis).    . clopidogrel (PLAVIX) 75 MG tablet Take 1 tablet (75 mg total) by mouth at bedtime. 30 tablet 6  . diphenhydrAMINE (SOMINEX) 25 MG  tablet Take 50 mg by mouth at bedtime as needed for sleep.    . nebivolol (BYSTOLIC) 5 MG tablet Take 1 tablet (5 mg total) by mouth daily. 30 tablet 6  . nitroGLYCERIN (NITROSTAT) 0.4 MG SL tablet Place 0.4 mg under the tongue every 5 (five)  minutes as needed for chest pain.     Marland Kitchen nystatin cream (MYCOSTATIN) Apply 1 application topically 2 (two) times daily. Use as directed 60 g 0  . pantoprazole (PROTONIX) 40 MG tablet Take 1 tablet (40 mg total) by mouth daily. 30 tablet 1  . ranitidine (ZANTAC) 150 MG tablet Take 1 tablet (150 mg total) by mouth at bedtime. 30 tablet 1  . simvastatin (ZOCOR) 20 MG tablet Take 1 tablet (20 mg total) by mouth at bedtime. 30 tablet 6   No current facility-administered medications for this visit.    Allergies  Allergen Reactions  . Penicillins Rash    History   Social History  . Marital Status: Married    Spouse Name: N/A  . Number of Children: N/A  . Years of Education: N/A   Occupational History  . Not on file.   Social History Main Topics  . Smoking status: Former Smoker -- 2.00 packs/day for 50 years    Types: Cigarettes    Quit date: 02/24/2011  . Smokeless tobacco: Never Used  . Alcohol Use: No     Comment: 12/13/2012 "last alcohol was ~ 4 yr ago"  . Drug Use: No  . Sexual Activity: Not Currently   Other Topics Concern  . Not on file   Social History Narrative     Review of Systems: General: negative for chills, fever, night sweats or weight changes.  Cardiovascular: negative for chest pain, dyspnea on exertion, edema, orthopnea, palpitations, paroxysmal nocturnal dyspnea or shortness of breath Dermatological: negative for rash Respiratory: negative for cough or wheezing Urologic: negative for hematuria Abdominal: negative for nausea, vomiting, diarrhea, bright red blood per rectum, melena, or hematemesis Neurologic: negative for visual changes, syncope, or dizziness All other systems reviewed and are otherwise negative except as noted above.    Blood pressure 160/62, pulse 68, height 5\' 11"  (1.803 m), weight 233 lb 6.4 oz (105.87 kg).  General appearance: alert and no distress Neck: no adenopathy, no JVD, supple, symmetrical, trachea midline, thyroid not  enlarged, symmetric, no tenderness/mass/nodules and soft bilateral carotid bruits Lungs: clear to auscultation bilaterally Heart: regular rate and rhythm, S1, S2 normal, no murmur, click, rub or gallop Extremities: extremities normal, atraumatic, no cyanosis or edema  EKG not performed today  ASSESSMENT AND PLAN:   Renal artery stenosis History of bilateral renal artery stenosis status post remote PT and stenting with 3 intervention using cutting balloon atherectomy for in-stent restenosis in September 2012. I then restented his left renal artery with an ICast  covered stent 12/14/12. That time he had 67% ostial stenosis of the right renal artery had we've been following him by duplex ultrasound. The most recent Doppler performed 3 months ago showed a widely patent left renal stent. The right renal artery could not be visualized because of overlying bowel gas.  Hyperlipidemia History of hyperlipidemia on simvastatin 20 mg a day. We'll recheck a lipid and liver profile  Essential hypertension History of hypertension with blood pressure measured at 160/62. He is on diastolic and amlodipine. Continue current meds at current dosing  Coronary artery disease- RCA DES 11/10 History of coronary artery disease status post RCA intervention by Dr. Georgina Peer 03/28/08 with  a [place] struggling stent. He has had no recurrent symptoms. He was unresponsive GI bleed and had load level troponins which I think were related to demand ischemia.      [name] MD FACP,FACC,FAHA, [facility] 11/22/2014 9:47 AM

## 2014-11-22 NOTE — Telephone Encounter (Signed)
Spoke with Dr. Laural Golden need to try to get samples of Pylera if we can.  Spoke with patient to let him know we are trying to get samples but if not we would work on his PA and try to get this ASAP.  This medication he was told is 800.00 and he can't afford.  We had some co-pay cards but they expired 11/09/2014.   Butch Penny please call rep and see if he can help [place] out.  If not please then forward to Tammy.

## 2014-11-27 NOTE — Telephone Encounter (Signed)
Steven Scott has called rep to see about samples and he suggested [facility] should have samples and he would replenish them this week if they would share.  Called and evidentially they did not have since I never heard back.

## 2014-11-29 NOTE — Telephone Encounter (Signed)
I have reached out to the drug rep Marcia Brash with [employer]. He states that he is awaiting to be released to sample this medication. He will be in our area tomorrow , and he is going to try and see what he can do to help [place]. Patient was called and made aware.

## 2014-12-04 ENCOUNTER — Telehealth (INDEPENDENT_AMBULATORY_CARE_PROVIDER_SITE_OTHER): Payer: Self-pay | Admitting: *Deleted

## 2014-12-04 DIAGNOSIS — K279 Peptic ulcer, site unspecified, unspecified as acute or chronic, without hemorrhage or perforation: Secondary | ICD-10-CM

## 2014-12-04 NOTE — Telephone Encounter (Signed)
Per Dr.Rehman the patient will need to have labs drawn in 1 month. 

## 2014-12-05 DIAGNOSIS — Z79899 Other long term (current) drug therapy: Secondary | ICD-10-CM | POA: Diagnosis not present

## 2014-12-05 DIAGNOSIS — E785 Hyperlipidemia, unspecified: Secondary | ICD-10-CM | POA: Diagnosis not present

## 2014-12-05 LAB — HEPATIC FUNCTION PANEL
ALBUMIN: 3.7 g/dL (ref 3.6–5.1)
ALK PHOS: 59 U/L (ref 40–115)
ALT: 9 U/L (ref 9–46)
AST: 13 U/L (ref 10–35)
BILIRUBIN DIRECT: 0.1 mg/dL (ref <=0.2)
BILIRUBIN TOTAL: 0.4 mg/dL (ref 0.2–1.2)
Indirect Bilirubin: 0.3 mg/dL (ref 0.2–1.2)
Total Protein: 6.5 g/dL (ref 6.1–8.1)

## 2014-12-05 LAB — LIPID PANEL
CHOL/HDL RATIO: 2.6 ratio (ref <=5.0)
Cholesterol: 103 mg/dL — ABNORMAL LOW (ref 125–200)
HDL: 40 mg/dL (ref >=40)
LDL Cholesterol: 51 mg/dL (ref <130)
Triglycerides: 59 mg/dL (ref <150)
VLDL: 12 mg/dL (ref <30)

## 2014-12-06 ENCOUNTER — Other Ambulatory Visit (INDEPENDENT_AMBULATORY_CARE_PROVIDER_SITE_OTHER): Payer: Self-pay | Admitting: *Deleted

## 2014-12-06 ENCOUNTER — Encounter: Payer: Self-pay | Admitting: *Deleted

## 2014-12-06 ENCOUNTER — Encounter (INDEPENDENT_AMBULATORY_CARE_PROVIDER_SITE_OTHER): Payer: Self-pay | Admitting: *Deleted

## 2014-12-06 DIAGNOSIS — K279 Peptic ulcer, site unspecified, unspecified as acute or chronic, without hemorrhage or perforation: Secondary | ICD-10-CM

## 2014-12-19 DIAGNOSIS — R972 Elevated prostate specific antigen [PSA]: Secondary | ICD-10-CM | POA: Diagnosis not present

## 2014-12-28 ENCOUNTER — Inpatient Hospital Stay (HOSPITAL_COMMUNITY): Admission: RE | Admit: 2014-12-28 | Payer: Medicare Other | Source: Ambulatory Visit

## 2014-12-28 ENCOUNTER — Encounter (HOSPITAL_COMMUNITY): Payer: Medicare Other

## 2014-12-29 ENCOUNTER — Ambulatory Visit (HOSPITAL_COMMUNITY)
Admission: RE | Admit: 2014-12-29 | Discharge: 2014-12-29 | Disposition: A | Discharge status: Home / Self Care | Payer: Medicare Other | Source: Ambulatory Visit | Attending: Cardiology | Admitting: Cardiology

## 2014-12-29 DIAGNOSIS — I6523 Occlusion and stenosis of bilateral carotid arteries: Secondary | ICD-10-CM | POA: Insufficient documentation

## 2014-12-29 DIAGNOSIS — R0989 Other specified symptoms and signs involving the circulatory and respiratory systems: Secondary | ICD-10-CM | POA: Diagnosis not present

## 2015-01-02 ENCOUNTER — Telehealth: Payer: Self-pay | Admitting: Cardiovascular Disease

## 2015-01-02 ENCOUNTER — Ambulatory Visit (INDEPENDENT_AMBULATORY_CARE_PROVIDER_SITE_OTHER): Payer: Medicare Other | Admitting: Urology

## 2015-01-02 DIAGNOSIS — R972 Elevated prostate specific antigen [PSA]: Secondary | ICD-10-CM

## 2015-01-02 DIAGNOSIS — K279 Peptic ulcer, site unspecified, unspecified as acute or chronic, without hemorrhage or perforation: Secondary | ICD-10-CM | POA: Diagnosis not present

## 2015-01-02 DIAGNOSIS — N401 Enlarged prostate with lower urinary tract symptoms: Secondary | ICD-10-CM | POA: Diagnosis not present

## 2015-01-02 DIAGNOSIS — R351 Nocturia: Secondary | ICD-10-CM | POA: Diagnosis not present

## 2015-01-02 DIAGNOSIS — I6523 Occlusion and stenosis of bilateral carotid arteries: Secondary | ICD-10-CM

## 2015-01-02 DIAGNOSIS — R0989 Other specified symptoms and signs involving the circulatory and respiratory systems: Secondary | ICD-10-CM

## 2015-01-02 NOTE — Telephone Encounter (Signed)
Spoke to patient. Result given . Verbalized understanding Repeat in 6 months  Carotid doppler order placed.

## 2015-01-02 NOTE — Telephone Encounter (Signed)
Pt is returning the nurse's call from yesterday about some results to a Carotid Doppler that he had on 8/19. Please f/u with him  Thanks

## 2015-01-03 LAB — CBC
HCT: 37.6 % — ABNORMAL LOW (ref 39.0–52.0)
Hemoglobin: 12 g/dL — ABNORMAL LOW (ref 13.0–17.0)
MCH: 27.6 pg (ref 26.0–34.0)
MCHC: 31.9 g/dL (ref 30.0–36.0)
MCV: 86.4 fL (ref 78.0–100.0)
MPV: 11.6 fL (ref 8.6–12.4)
Platelets: 237 10*3/uL (ref 150–400)
RBC: 4.35 MIL/uL (ref 4.22–5.81)
RDW: 14.7 % (ref 11.5–15.5)
WBC: 6.2 10*3/uL (ref 4.0–10.5)

## 2015-01-08 ENCOUNTER — Telehealth (INDEPENDENT_AMBULATORY_CARE_PROVIDER_SITE_OTHER): Payer: Self-pay | Admitting: *Deleted

## 2015-01-08 DIAGNOSIS — K279 Peptic ulcer, site unspecified, unspecified as acute or chronic, without hemorrhage or perforation: Secondary | ICD-10-CM

## 2015-01-08 NOTE — Telephone Encounter (Signed)
Per Dr.Rehman the patient will need to have labs drawn in 3 months 

## 2015-01-23 DIAGNOSIS — L570 Actinic keratosis: Secondary | ICD-10-CM | POA: Diagnosis not present

## 2015-01-23 DIAGNOSIS — L400 Psoriasis vulgaris: Secondary | ICD-10-CM | POA: Diagnosis not present

## 2015-02-06 ENCOUNTER — Encounter (INDEPENDENT_AMBULATORY_CARE_PROVIDER_SITE_OTHER): Payer: Self-pay | Admitting: *Deleted

## 2015-02-15 ENCOUNTER — Telehealth: Payer: Self-pay | Admitting: Cardiovascular Disease

## 2015-02-15 ENCOUNTER — Encounter (INDEPENDENT_AMBULATORY_CARE_PROVIDER_SITE_OTHER): Payer: Self-pay | Admitting: *Deleted

## 2015-02-15 ENCOUNTER — Telehealth (INDEPENDENT_AMBULATORY_CARE_PROVIDER_SITE_OTHER): Payer: Self-pay | Admitting: *Deleted

## 2015-02-15 ENCOUNTER — Other Ambulatory Visit (INDEPENDENT_AMBULATORY_CARE_PROVIDER_SITE_OTHER): Payer: Self-pay | Admitting: Internal Medicine

## 2015-02-15 DIAGNOSIS — Q400 Congenital hypertrophic pyloric stenosis: Secondary | ICD-10-CM

## 2015-02-15 DIAGNOSIS — K279 Peptic ulcer, site unspecified, unspecified as acute or chronic, without hemorrhage or perforation: Secondary | ICD-10-CM

## 2015-02-15 DIAGNOSIS — A048 Other specified bacterial intestinal infections: Secondary | ICD-10-CM

## 2015-02-15 NOTE — Telephone Encounter (Signed)
Ann called in stating that the pt is scheduled to have an Endoscopy on 11/3 and she wanted to see if Dr. Gwenlyn Found approved of the pt holding his Plavix 5 days before and his aspirin 2 days before his procedure. Please f/u with Lelon Frohlich  Thanks

## 2015-02-15 NOTE — Telephone Encounter (Signed)
Referring MD/PCP: koberlein   Procedure: egd  Reason/Indication:  PUD, H pylori  Has patient had this procedure before?  Yes, 10/2014 -- epic  If so, when, by whom and where?    Is there a family history of colon cancer?    Who?  What age when diagnosed?    Is patient diabetic?   no      Does patient have prosthetic heart valve?  no  Do you have a pacemaker?  no  Has patient ever had endocarditis? no  Has patient had joint replacement within last 12 months?  no  Does patient tend to be constipated or take laxatives? no  Does patient have a history of alcohol/drug use? no  Is patient on Coumadin, Plavix and/or Aspirin? yes  Medications: see epic  Allergies: pcn  Medication Adjustment: Plavix 5 days, ASA 2 days  Procedure date & time: 03/15/15 at 1255

## 2015-02-15 NOTE — Telephone Encounter (Signed)
Requesting surgical clearance:   1. Type of surgery: Endoscopy  2. Surgeon: Dr. Laural Golden  3. Surgical date: 03/15/2015  4. Medications that need to be held: Plavix 5 days before; and ASA 2 days  5. CAD: yes     6. I will defer to: Dr. Gwenlyn Found

## 2015-02-15 NOTE — Telephone Encounter (Signed)
Routed to Dr. Olevia Perches nurse

## 2015-02-15 NOTE — Telephone Encounter (Signed)
Can interrupt antiplatelets therapy for colonoscopy

## 2015-02-16 NOTE — Telephone Encounter (Signed)
Patient aware.

## 2015-02-16 NOTE — Telephone Encounter (Signed)
agree

## 2015-02-28 DIAGNOSIS — Z79899 Other long term (current) drug therapy: Secondary | ICD-10-CM | POA: Diagnosis not present

## 2015-02-28 DIAGNOSIS — I129 Hypertensive chronic kidney disease with stage 1 through stage 4 chronic kidney disease, or unspecified chronic kidney disease: Secondary | ICD-10-CM | POA: Diagnosis not present

## 2015-02-28 DIAGNOSIS — N183 Chronic kidney disease, stage 3 (moderate): Secondary | ICD-10-CM | POA: Diagnosis not present

## 2015-02-28 DIAGNOSIS — E559 Vitamin D deficiency, unspecified: Secondary | ICD-10-CM | POA: Diagnosis not present

## 2015-02-28 DIAGNOSIS — D509 Iron deficiency anemia, unspecified: Secondary | ICD-10-CM | POA: Diagnosis not present

## 2015-02-28 DIAGNOSIS — R809 Proteinuria, unspecified: Secondary | ICD-10-CM | POA: Diagnosis not present

## 2015-03-05 ENCOUNTER — Encounter (HOSPITAL_COMMUNITY): Payer: Medicare Other

## 2015-03-06 DIAGNOSIS — R809 Proteinuria, unspecified: Secondary | ICD-10-CM | POA: Diagnosis not present

## 2015-03-06 DIAGNOSIS — I10 Essential (primary) hypertension: Secondary | ICD-10-CM | POA: Diagnosis not present

## 2015-03-06 DIAGNOSIS — N183 Chronic kidney disease, stage 3 (moderate): Secondary | ICD-10-CM | POA: Diagnosis not present

## 2015-03-06 DIAGNOSIS — D649 Anemia, unspecified: Secondary | ICD-10-CM | POA: Diagnosis not present

## 2015-03-06 DIAGNOSIS — Z23 Encounter for immunization: Secondary | ICD-10-CM | POA: Diagnosis not present

## 2015-03-07 ENCOUNTER — Ambulatory Visit (INDEPENDENT_AMBULATORY_CARE_PROVIDER_SITE_OTHER): Payer: Medicare Other

## 2015-03-07 DIAGNOSIS — I701 Atherosclerosis of renal artery: Secondary | ICD-10-CM | POA: Diagnosis not present

## 2015-03-07 DIAGNOSIS — I10 Essential (primary) hypertension: Secondary | ICD-10-CM | POA: Diagnosis not present

## 2015-03-08 ENCOUNTER — Other Ambulatory Visit (INDEPENDENT_AMBULATORY_CARE_PROVIDER_SITE_OTHER): Payer: Self-pay | Admitting: *Deleted

## 2015-03-08 ENCOUNTER — Encounter (INDEPENDENT_AMBULATORY_CARE_PROVIDER_SITE_OTHER): Payer: Self-pay | Admitting: *Deleted

## 2015-03-08 DIAGNOSIS — K279 Peptic ulcer, site unspecified, unspecified as acute or chronic, without hemorrhage or perforation: Secondary | ICD-10-CM

## 2015-03-13 DIAGNOSIS — I4891 Unspecified atrial fibrillation: Secondary | ICD-10-CM

## 2015-03-13 DIAGNOSIS — K279 Peptic ulcer, site unspecified, unspecified as acute or chronic, without hemorrhage or perforation: Secondary | ICD-10-CM | POA: Diagnosis not present

## 2015-03-13 HISTORY — DX: Unspecified atrial fibrillation: I48.91

## 2015-03-13 LAB — HEMOGLOBIN AND HEMATOCRIT, BLOOD
HEMATOCRIT: 40.5 % (ref 39.0–52.0)
Hemoglobin: 12.9 g/dL — ABNORMAL LOW (ref 13.0–17.0)

## 2015-03-15 ENCOUNTER — Encounter (HOSPITAL_COMMUNITY): Payer: Self-pay | Admitting: *Deleted

## 2015-03-15 ENCOUNTER — Encounter (HOSPITAL_COMMUNITY)
Admission: RE | Disposition: A | Discharge status: Home / Self Care | Payer: Self-pay | Source: Ambulatory Visit | Attending: Internal Medicine

## 2015-03-15 ENCOUNTER — Ambulatory Visit (HOSPITAL_COMMUNITY)
Admission: RE | Admit: 2015-03-15 | Discharge: 2015-03-15 | Disposition: A | Discharge status: Home / Self Care | Payer: Medicare Other | Source: Ambulatory Visit | Attending: Internal Medicine | Admitting: Internal Medicine

## 2015-03-15 DIAGNOSIS — K449 Diaphragmatic hernia without obstruction or gangrene: Secondary | ICD-10-CM | POA: Diagnosis not present

## 2015-03-15 DIAGNOSIS — Z79899 Other long term (current) drug therapy: Secondary | ICD-10-CM | POA: Insufficient documentation

## 2015-03-15 DIAGNOSIS — I10 Essential (primary) hypertension: Secondary | ICD-10-CM | POA: Diagnosis not present

## 2015-03-15 DIAGNOSIS — E785 Hyperlipidemia, unspecified: Secondary | ICD-10-CM | POA: Insufficient documentation

## 2015-03-15 DIAGNOSIS — K279 Peptic ulcer, site unspecified, unspecified as acute or chronic, without hemorrhage or perforation: Secondary | ICD-10-CM

## 2015-03-15 DIAGNOSIS — A048 Other specified bacterial intestinal infections: Secondary | ICD-10-CM

## 2015-03-15 DIAGNOSIS — I2510 Atherosclerotic heart disease of native coronary artery without angina pectoris: Secondary | ICD-10-CM | POA: Diagnosis not present

## 2015-03-15 DIAGNOSIS — Z7982 Long term (current) use of aspirin: Secondary | ICD-10-CM | POA: Insufficient documentation

## 2015-03-15 DIAGNOSIS — Z7902 Long term (current) use of antithrombotics/antiplatelets: Secondary | ICD-10-CM | POA: Diagnosis not present

## 2015-03-15 DIAGNOSIS — K210 Gastro-esophageal reflux disease with esophagitis: Secondary | ICD-10-CM | POA: Diagnosis not present

## 2015-03-15 DIAGNOSIS — Z87891 Personal history of nicotine dependence: Secondary | ICD-10-CM | POA: Diagnosis not present

## 2015-03-15 DIAGNOSIS — K2970 Gastritis, unspecified, without bleeding: Secondary | ICD-10-CM | POA: Insufficient documentation

## 2015-03-15 DIAGNOSIS — K259 Gastric ulcer, unspecified as acute or chronic, without hemorrhage or perforation: Secondary | ICD-10-CM | POA: Diagnosis present

## 2015-03-15 DIAGNOSIS — B9681 Helicobacter pylori [H. pylori] as the cause of diseases classified elsewhere: Secondary | ICD-10-CM

## 2015-03-15 HISTORY — PX: ESOPHAGOGASTRODUODENOSCOPY: SHX5428

## 2015-03-15 SURGERY — EGD (ESOPHAGOGASTRODUODENOSCOPY)
Anesthesia: Moderate Sedation

## 2015-03-15 MED ORDER — MEPERIDINE HCL 50 MG/ML IJ SOLN
INTRAMUSCULAR | Status: DC | PRN
  Administered 2015-03-15 (×2): 25 mg via INTRAVENOUS

## 2015-03-15 MED ORDER — MIDAZOLAM HCL 5 MG/5ML IJ SOLN
INTRAMUSCULAR | Status: AC
  Filled 2015-03-15: qty 10

## 2015-03-15 MED ORDER — BUTAMBEN-TETRACAINE-BENZOCAINE 2-2-14 % EX AERO
INHALATION_SPRAY | CUTANEOUS | Status: DC | PRN
  Administered 2015-03-15: 2 via TOPICAL

## 2015-03-15 MED ORDER — MIDAZOLAM HCL 5 MG/5ML IJ SOLN
INTRAMUSCULAR | Status: DC | PRN
  Administered 2015-03-15: 2 mg via INTRAVENOUS
  Administered 2015-03-15: 1 mg via INTRAVENOUS
  Administered 2015-03-15: 2 mg via INTRAVENOUS

## 2015-03-15 MED ORDER — SIMETHICONE 40 MG/0.6ML PO SUSP
ORAL | Status: DC | PRN
  Administered 2015-03-15: 13:00:00

## 2015-03-15 MED ORDER — SODIUM CHLORIDE 0.9 % IV SOLN
INTRAVENOUS | Status: DC
  Administered 2015-03-15: 12:00:00 via INTRAVENOUS

## 2015-03-15 MED ORDER — MEPERIDINE HCL 50 MG/ML IJ SOLN
INTRAMUSCULAR | Status: AC
  Filled 2015-03-15: qty 1

## 2015-03-15 MED ORDER — PANTOPRAZOLE SODIUM 40 MG PO TBEC: 40.0000 mg | DELAYED_RELEASE_TABLET | Freq: Every day | ORAL | Status: DC

## 2015-03-15 NOTE — Discharge Instructions (Signed)
Resume pantoprazole at 40 mg by mouth 30 minutes before breakfast daily. Resume usual medications including aspirin and clopidogrel. Take clopidogrel in the evening in order to minimize interaction with pantoprazole. Resume usual diet. No driving for 24 hours. Call if you have tarry stool.  Gastrointestinal Endoscopy, Care After Refer to this sheet in the next few weeks. These instructions provide you with information on caring for yourself after your procedure. Your caregiver may also give you more specific instructions. Your treatment has been planned according to current medical practices, but problems sometimes occur. Call your caregiver if you have any problems or questions after your procedure. HOME CARE INSTRUCTIONS  If you were given medicine to help you relax (sedative), do not drive, operate machinery, or sign important documents for 24 hours.  Avoid alcohol and hot or warm beverages for the first 24 hours after the procedure.  Only take over-the-counter or prescription medicines for pain, discomfort, or fever as directed by your caregiver. You may resume taking your normal medicines unless your caregiver tells you otherwise. Ask your caregiver when you may resume taking medicines that may cause bleeding, such as aspirin, clopidogrel, or warfarin.  You may return to your normal diet and activities on the day after your procedure, or as directed by your caregiver. Walking may help to reduce any bloated feeling in your abdomen.  Drink enough fluids to keep your urine clear or pale yellow.  You may gargle with salt water if you have a sore throat. SEEK IMMEDIATE MEDICAL CARE IF:  You have severe nausea or vomiting.  You have severe abdominal pain, abdominal cramps that last longer than 6 hours, or abdominal swelling (distention).  You have severe shoulder or back pain.  You have trouble swallowing.  You have shortness of breath, your breathing is shallow, or you are breathing  faster than normal.  You have a fever or a rapid heartbeat.  You vomit blood or material that looks like coffee grounds.  You have bloody, black, or tarry stools. MAKE SURE YOU:  Understand these instructions.  Will watch your condition.  Will get help right away if you are not doing well or get worse.   This information is not intended to replace advice given to you by your health care provider. Make sure you discuss any questions you have with your health care provider.   Document Released: 12/11/2003 Document Revised: 05/19/2014 Document Reviewed: 07/29/2011 Elsevier Interactive Patient Education [employer].

## 2015-03-15 NOTE — Op Note (Signed)
EGD PROCEDURE REPORT  PATIENT:  Steven Scott  MR#:  [id number] Birthdate:  08/06/1945, 69 y.o., male Endoscopist:  Dr. Rogene Houston, MD Referred By:  Dr. Rocky Morel, MD  Procedure Date: 03/15/2015  Procedure:   EGD  Indications:  Patient is 69 year old Caucasian male who was hospitalized about 4 months ago with upper GI bleed. He was found to have two gastric ulcers. He underwent therapeutic intervention. He has been treated for H. pylori gastritis. He remains on aspirin and Plavix although these aren't hold for the procedure. He is returning for follow-up EGD to document complete healing of these ulcers.            Informed Consent:  The risks, benefits, alternatives & imponderables which include, but are not limited to, bleeding, infection, perforation, drug reaction and potential missed lesion have been reviewed.  The potential for biopsy, lesion removal, esophageal dilation, etc. have also been discussed.  Questions have been answered.  All parties agreeable.  Please see history & physical in medical record for more information.  Medications:  Demerol 50 mg IV Versed 5 mg IV Cetacaine spray topically for oropharyngeal anesthesia  Description of procedure:  The endoscope was introduced through the mouth and advanced to the second portion of the duodenum without difficulty or limitations. The mucosal surfaces were surveyed very carefully during advancement of the scope and upon withdrawal.  Findings:  Esophagus:  Mucosa of the esophagus was normal. Focal edema and edema noted at GE junction. GEJ:  42 cm Hiatus:  44 cm Stomach:  Stomach was empty and distended very well with insufflation. Folds in the proximal stomach were normal. Mucosa gastric body was normal. Two antral scars noted with focal erythema. No ulceration noted. Pyloric channel was patent. Angularis fundus and cardia were normal. Duodenum:  Bulbar and post bulbar mucosa.  Therapeutic/Diagnostic Maneuvers  Performed:  None  Complications:  None  EBL: None  Impression: Mild changes of reflux esophagitis limited to GE junction. Small sliding hiatal hernia. Gastric ulcers have completely healed. Focal gastritis noted in prepyloric region.  Recommendations:  Standard instructions given. Since patient is on low-dose aspirin and clopidogrel risk of recurrent ulcer is significantly should go back on pantoprazole 40 mg by mouth every morning and he should take clopidogrel in the evening. Patient will call if he experiences melena again.  Matha Masse U  03/15/2015  1:15 PM  CC: Dr. Ethlyn Gallery, Kassie Mends, MD & Dr. Rayne Du ref. provider found

## 2015-03-15 NOTE — H&P (Signed)
Steven Scott is an 69 y.o. male.   Chief Complaint: Patient is here for EGD. HPI: A she is 69 year old Caucasian male who was hospitalized about 4 months ago for melena and anemia and found to have 2 gastric ulcers. He was also found to have H. pylori infection has been treated. His hemoglobin has improved from 10.5 12.9. Is not having more epigastric pain or melena. He is undergoing EGD to document complete healing of these gastric ulcers. He has been off his aspirin and Plavix a few days.  Past Medical History  Diagnosis Date  . Coronary artery disease   . Hypertension   . Renal artery stenosis (HCC)     Multiple interventions  . Status post percutaneous angioplasty of renal artery 10/18 2012    PV angiogram preformed revealing 90% right and 70% left in stent restenosis with 62mm and 65mm gradients respectively.  . Hyperlipidemia   . Shortness of breath     "at any time" (12/13/2012)    Past Surgical History  Procedure Laterality Date  . Lithotripsy      "laser" (12/13/2012)  . Colonoscopy  12/18/2010    Procedure: COLONOSCOPY;  Surgeon: Steven Houston, MD;  Location: [facility];  Service: Endoscopy;  Laterality: N/A;  . Coronary angioplasty with stent placement  03/28/2009    Successful PCI od high-grade 95% eccentric tandem plague stenosis in the distal right coronary artery with ultimate insertion of a 3.0 x 18 mm Xience V DES stent postdilated at 3.46 mm with the 99% stenosis being reduced to 0%  . Cardiac catheterization  03/02/2009    has moderate circumflex and diagonal branch disease with high-grade distal right disease and fail attempt at PCI  . Renal artery stent Bilateral 2010  . Renal artery angioplasty Bilateral 02/2014    PV restenting L RA with an ICast covered stent and cutting balloon atherectomy R RA  . Renal angiogram N/A 12/14/2012    Procedure: RENAL ANGIOGRAM;  Surgeon: Steven Harp, MD;  Location: [facility] CATH LAB;  Service: Cardiovascular;  Laterality: N/A;  .  Percutaneous stent intervention Left 12/14/2012    Procedure: PERCUTANEOUS STENT INTERVENTION;  Surgeon: Steven Harp, MD;  Location: [facility] CATH LAB;  Service: Cardiovascular;  Laterality: Left;  left renal artery  . Renal angiogram N/A 02/16/2014    Procedure: RENAL ANGIOGRAM;  Surgeon: Steven Harp, MD;  Location: [facility] CATH LAB;  Service: Cardiovascular;  Laterality: N/A;  . Percutaneous stent intervention  02/16/2014    Procedure: PERCUTANEOUS STENT INTERVENTION;  Surgeon: Steven Harp, MD;  Location: [facility] CATH LAB;  Service: Cardiovascular;;  left renal  . Esophagogastroduodenoscopy N/A 11/08/2014    Procedure: ESOPHAGOGASTRODUODENOSCOPY (EGD);  Surgeon: Steven Houston, MD;  Location: [facility];  Service: Endoscopy;  Laterality: N/A;    Family History  Problem Relation Age of Onset  . COPD Father 14    deceased   Social History:  reports that he quit smoking about 4 years ago. His smoking use included Cigarettes. He has a 100 pack-year smoking history. He has never used smokeless tobacco. He reports that he does not drink alcohol or use illicit drugs.  Allergies:  Allergies  Allergen Reactions  . Penicillins Rash    Medications Prior to Admission  Medication Sig Dispense Refill  . acetaminophen (TYLENOL) 500 MG tablet Take 500 mg by mouth every 6 (six) hours as needed for moderate pain.    Marland Kitchen aspirin EC 81 MG tablet Take 1 tablet (  81 mg total) by mouth daily. Start on 11/15/14 30 tablet 1  . cholecalciferol (VITAMIN D) 1000 UNITS tablet Take 1,000 Units by mouth daily.    . clobetasol cream (TEMOVATE) AB-123456789 % Apply 1 application topically daily as needed (Psoriasis).    . clopidogrel (PLAVIX) 75 MG tablet Take 1 tablet (75 mg total) by mouth at bedtime. 30 tablet 6  . nebivolol (BYSTOLIC) 5 MG tablet Take 1 tablet (5 mg total) by mouth daily. 30 tablet 6  . Olmesartan-Amlodipine-HCTZ (TRIBENZOR) 40-10-25 MG TABS Take 1 tablet by mouth daily.    . simvastatin (ZOCOR) 20 MG tablet  Take 1 tablet (20 mg total) by mouth at bedtime. 30 tablet 6  . amLODipine (NORVASC) 10 MG tablet Take 1 tablet (10 mg total) by mouth daily. (Patient not taking: Reported on 03/07/2015) 30 tablet 1  . bismuth-metronidazole-tetracycline (PYLERA) 140-125-125 MG per capsule Take 3 capsules by mouth 4 (four) times daily -  before meals and at bedtime. (Patient not taking: Reported on 03/07/2015) 120 capsule 0  . diphenhydrAMINE (SOMINEX) 25 MG tablet Take 50 mg by mouth at bedtime as needed for sleep.    . nitroGLYCERIN (NITROSTAT) 0.4 MG SL tablet Place 0.4 mg under the tongue every 5 (five) minutes as needed for chest pain.     Marland Kitchen nystatin cream (MYCOSTATIN) Apply 1 application topically 2 (two) times daily. Use as directed (Patient not taking: Reported on 03/07/2015) 60 g 0  . pantoprazole (PROTONIX) 40 MG tablet Take 1 tablet (40 mg total) by mouth daily. (Patient not taking: Reported on 03/07/2015) 30 tablet 1  . ranitidine (ZANTAC) 150 MG tablet Take 1 tablet (150 mg total) by mouth at bedtime. (Patient not taking: Reported on 03/07/2015) 30 tablet 1    No results found for this or any previous visit (from the past 48 hour(s)). No results found.  ROS  Blood pressure 172/70, pulse 89, temperature 97.9 F (36.6 C), temperature source Oral, resp. rate 15, height 5\' 11"  (1.803 m), weight 233 lb (105.688 kg), SpO2 100 %. Physical Exam  Constitutional: He appears well-developed and well-nourished.  HENT:  Mouth/Throat: Oropharynx is clear and moist.  Eyes: Conjunctivae are normal. No scleral icterus.  Neck: No thyromegaly present.  Cardiovascular: Normal rate, regular rhythm and normal heart sounds.   No murmur heard. Respiratory: Effort normal and breath sounds normal.  GI:  Abdomen is full. Indurated area below the umbilicus felt to be a small lipoma. No tenderness organomegaly or masses noted.  Musculoskeletal: He exhibits no edema.  Lymphadenopathy:    He has no cervical adenopathy.   Neurological: He is alert.  Skin: Skin is warm and dry.     Assessment/Plan History of gastric ulcers. EGD to document complete healing of these ulcers.  Orbie Grupe U 03/15/2015, 12:53 PM

## 2015-03-20 ENCOUNTER — Encounter (HOSPITAL_COMMUNITY): Payer: Self-pay | Admitting: Internal Medicine

## 2015-03-26 ENCOUNTER — Ambulatory Visit (INDEPENDENT_AMBULATORY_CARE_PROVIDER_SITE_OTHER): Payer: Medicare Other | Admitting: Cardiology

## 2015-03-26 ENCOUNTER — Encounter: Payer: Self-pay | Admitting: Cardiology

## 2015-03-26 VITALS — BP 124/70 | HR 80 | Ht 71.0 in | Wt 231.0 lb

## 2015-03-26 DIAGNOSIS — I4891 Unspecified atrial fibrillation: Secondary | ICD-10-CM

## 2015-03-26 DIAGNOSIS — N183 Chronic kidney disease, stage 3 unspecified: Secondary | ICD-10-CM

## 2015-03-26 DIAGNOSIS — I701 Atherosclerosis of renal artery: Secondary | ICD-10-CM | POA: Diagnosis not present

## 2015-03-26 DIAGNOSIS — I10 Essential (primary) hypertension: Secondary | ICD-10-CM

## 2015-03-26 DIAGNOSIS — I482 Chronic atrial fibrillation, unspecified: Secondary | ICD-10-CM | POA: Insufficient documentation

## 2015-03-26 DIAGNOSIS — E785 Hyperlipidemia, unspecified: Secondary | ICD-10-CM | POA: Diagnosis not present

## 2015-03-26 DIAGNOSIS — E669 Obesity, unspecified: Secondary | ICD-10-CM

## 2015-03-26 NOTE — Assessment & Plan Note (Signed)
Followed in [place]

## 2015-03-26 NOTE — Patient Instructions (Signed)
Your physician recommends that you schedule a follow-up appointment in JAN 2017 after renal doppler are completed.  Your physician has requested that you have a renal artery duplex. During this test, an ultrasound is used to evaluate blood flow to the kidneys. Allow one hour for this exam. Do not eat after midnight the day before and avoid carbonated beverages. Take your medications as you usually do.  No change with current treatment.   If you need a refill on your cardiac medications before your next appointment, please call your pharmacy.

## 2015-03-26 NOTE — Progress Notes (Signed)
03/26/2015 Steven Scott   14-Apr-1946  RQ:[id number]  Primary Physician Rocky Morel, MD Primary Cardiologist: Dr Gwenlyn Found  HPI:  69 year old, moderately overweight, married Caucasian male, who has a history of persistent hypertension status post bilateral renal artery PTA and stenting.  Dr Gwenlyn Found stented both his renal arteries successfully in 2009  but was unable to cross his distal RCA, which Dr. Claiborne Billings subsequently performed successfully on March 28, 2008, with a Xience V drug-eluting stent. He has not had recurrent angina.  Renal Dopplers performed January 31, 2011, showed progression of disease in both renal arteries suggesting in-stent restenosis. He underwent AngioSculpt cutting balloon atherectomy of both renal arteries with an excellent angiographic and followup Doppler result.  His creatinine has slowly increased now to close to 2. He is followed in [place] for this. Renal Dopplers performed in our office 11/19/12 suggested rapid progression and in-stent restenosis and he underwent his 3d renal artery intervention. Dr Gwenlyn Found saw him in July 2016 and he was doing well. Dopplers done 03/04/15 again suggested ISR of the renal stents but Dr Gwenlyn Found felt this was not an optimal study. He suggested a repeat doppler in 3 months.     He is in the office today for 6 month f/u. He denies chest pain or unusual DOE. His B/P is controlled. He has labs in [place] with the nephrologist there. He did have a follow up endoscopy 03/15/15 that showed improvement but residual gastritis. He denies any palpitations or tachycardia. His EKG today shows AF with CVR. This is new for him, last EKG in June showed NSR.   Current Outpatient Prescriptions  Medication Sig Dispense Refill  . acetaminophen (TYLENOL) 500 MG tablet Take 500 mg by mouth every 6 (six) hours as needed for moderate pain.    Marland Kitchen aspirin EC 81 MG tablet Take 1 tablet (81 mg total) by mouth daily. Start on 11/15/14 30 tablet 1  .  cholecalciferol (VITAMIN D) 1000 UNITS tablet Take 1,000 Units by mouth daily.    . clobetasol cream (TEMOVATE) AB-123456789 % Apply 1 application topically daily as needed (Psoriasis).    . clopidogrel (PLAVIX) 75 MG tablet Take 1 tablet (75 mg total) by mouth at bedtime. 30 tablet 6  . nebivolol (BYSTOLIC) 5 MG tablet Take 1 tablet (5 mg total) by mouth daily. 30 tablet 6  . Olmesartan-Amlodipine-HCTZ (TRIBENZOR) 40-10-25 MG TABS Take 1 tablet by mouth daily.    . pantoprazole (PROTONIX) 40 MG tablet Take 1 tablet (40 mg total) by mouth daily. 30 tablet 5  . simvastatin (ZOCOR) 20 MG tablet Take 1 tablet (20 mg total) by mouth at bedtime. 30 tablet 6  . diphenhydrAMINE (SOMINEX) 25 MG tablet Take 50 mg by mouth at bedtime as needed for sleep.    . nitroGLYCERIN (NITROSTAT) 0.4 MG SL tablet Place 0.4 mg under the tongue every 5 (five) minutes as needed for chest pain.      No current facility-administered medications for this visit.    Allergies  Allergen Reactions  . Penicillins Rash    Social History   Social History  . Marital Status: Married    Spouse Name: N/A  . Number of Children: N/A  . Years of Education: N/A   Occupational History  . Not on file.   Social History Main Topics  . Smoking status: Former Smoker -- 2.00 packs/day for 50 years    Types: Cigarettes    Quit date: 02/24/2011  . Smokeless tobacco: Never Used  .  Alcohol Use: No     Comment: 12/13/2012 "last alcohol was ~ 4 yr ago"  . Drug Use: No  . Sexual Activity: Not Currently   Other Topics Concern  . Not on file   Social History Narrative     Review of Systems: General: negative for chills, fever, night sweats or weight changes.  Cardiovascular: negative for chest pain, dyspnea on exertion, edema, orthopnea, palpitations, paroxysmal nocturnal dyspnea or shortness of breath Dermatological: negative for rash Respiratory: negative for cough or wheezing Urologic: negative for hematuria Abdominal: negative  for nausea, vomiting, diarrhea, bright red blood per rectum, melena, or hematemesis Neurologic: negative for visual changes, syncope, or dizziness All other systems reviewed and are otherwise negative except as noted above.    Blood pressure 124/70, pulse 80, height 5\' 11"  (1.803 m), weight 231 lb (104.781 kg).  General appearance: alert, cooperative, no distress, moderately obese and HOH, hearing aid in place Neck: no JVD and thyroid not enlarged, symmetric, no tenderness/mass/nodules Lungs: clear to auscultation bilaterally Heart: irregularly irregular rhythm Extremities: no edema Skin: Skin color, texture, turgor normal. No rashes or lesions Neurologic: Grossly normal  EKG AF with CVR, TWI inferior lateral leads with QV2  ASSESSMENT AND PLAN:   Atrial fibrillation, new onset (HCC) EKG shows AF with VR 80. This is new compared with 11/07/14. CHADs  VASc= 3 for age , HTN, and vascular disease  Renal artery stenosis Bilat RA PTA '09, Lt RA ISR- PTA Aug 2014, Bilateral RA PTA 02/16/14 Dopplers in Oct suggest at least moderate ISR but not a good study  CKD (chronic kidney disease) stage 3, GFR 30-59 ml/min Followed in [place]  CAD -S/P RCA DES 2010 No angina  GI bleed June 2016, recent endoscopy showed some residual gastritis with healing ulcers.  Essential hypertension Controlled  HLD (hyperlipidemia) On statin Rx  Obesity BMI 32   PLAN  Will discuss with Dr Gwenlyn Found. i suspect he is too high risk for anticoagulation. His rate is controlled and he is asymptomatic. Consider a Myoview with new TWI ( though he does not have angina). F/U renal dopplers in Jan.   [name] K PA-C 03/26/2015 2:02 PM

## 2015-03-26 NOTE — Assessment & Plan Note (Signed)
June 2016, recent endoscopy showed some residual gastritis with healing ulcers.

## 2015-03-26 NOTE — Assessment & Plan Note (Signed)
No angina 

## 2015-03-26 NOTE — Assessment & Plan Note (Signed)
BMI 32 

## 2015-03-26 NOTE — Assessment & Plan Note (Signed)
Bilat RA PTA '09, Lt RA ISR- PTA Aug 2014, Bilateral RA PTA 02/16/14 Dopplers in Oct suggest at least moderate ISR but not a good study

## 2015-03-26 NOTE — Assessment & Plan Note (Signed)
EKG shows AF with VR 80. This is new compared with 11/07/14. CHADs  VASc= 3 for age , HTN, and vascular disease

## 2015-03-26 NOTE — Assessment & Plan Note (Signed)
On statin Rx 

## 2015-03-26 NOTE — Assessment & Plan Note (Signed)
Controlled.  

## 2015-04-25 ENCOUNTER — Other Ambulatory Visit: Payer: Self-pay | Admitting: Cardiovascular Disease

## 2015-04-26 DIAGNOSIS — Z1389 Encounter for screening for other disorder: Secondary | ICD-10-CM | POA: Diagnosis not present

## 2015-04-26 DIAGNOSIS — L03119 Cellulitis of unspecified part of limb: Secondary | ICD-10-CM | POA: Diagnosis not present

## 2015-04-26 DIAGNOSIS — Z6832 Body mass index (BMI) 32.0-32.9, adult: Secondary | ICD-10-CM | POA: Diagnosis not present

## 2015-04-26 DIAGNOSIS — J189 Pneumonia, unspecified organism: Secondary | ICD-10-CM | POA: Diagnosis not present

## 2015-05-29 ENCOUNTER — Ambulatory Visit (HOSPITAL_COMMUNITY)
Admission: RE | Admit: 2015-05-29 | Discharge: 2015-05-29 | Disposition: A | Discharge status: Home / Self Care | Payer: Medicare Other | Source: Ambulatory Visit | Attending: Cardiovascular Disease | Admitting: Cardiovascular Disease

## 2015-05-29 DIAGNOSIS — I4891 Unspecified atrial fibrillation: Secondary | ICD-10-CM

## 2015-05-29 DIAGNOSIS — N183 Chronic kidney disease, stage 3 unspecified: Secondary | ICD-10-CM

## 2015-06-08 ENCOUNTER — Ambulatory Visit (HOSPITAL_COMMUNITY)
Admission: RE | Admit: 2015-06-08 | Discharge: 2015-06-08 | Disposition: A | Discharge status: Home / Self Care | Payer: Medicare Other | Source: Ambulatory Visit | Attending: Cardiovascular Disease | Admitting: Cardiovascular Disease

## 2015-06-08 DIAGNOSIS — N183 Chronic kidney disease, stage 3 (moderate): Secondary | ICD-10-CM | POA: Insufficient documentation

## 2015-06-08 DIAGNOSIS — E785 Hyperlipidemia, unspecified: Secondary | ICD-10-CM | POA: Diagnosis not present

## 2015-06-08 DIAGNOSIS — I701 Atherosclerosis of renal artery: Secondary | ICD-10-CM | POA: Diagnosis not present

## 2015-06-08 DIAGNOSIS — I4891 Unspecified atrial fibrillation: Secondary | ICD-10-CM | POA: Insufficient documentation

## 2015-06-08 DIAGNOSIS — I129 Hypertensive chronic kidney disease with stage 1 through stage 4 chronic kidney disease, or unspecified chronic kidney disease: Secondary | ICD-10-CM | POA: Diagnosis not present

## 2015-06-08 DIAGNOSIS — I10 Essential (primary) hypertension: Secondary | ICD-10-CM | POA: Diagnosis not present

## 2015-06-13 ENCOUNTER — Other Ambulatory Visit: Payer: Self-pay | Admitting: Cardiovascular Disease

## 2015-06-14 NOTE — Telephone Encounter (Signed)
Rx request sent to pharmacy.  

## 2015-06-26 ENCOUNTER — Encounter: Payer: Self-pay | Admitting: Cardiovascular Disease

## 2015-06-26 ENCOUNTER — Ambulatory Visit (INDEPENDENT_AMBULATORY_CARE_PROVIDER_SITE_OTHER): Payer: Medicare Other | Admitting: Cardiovascular Disease

## 2015-06-26 ENCOUNTER — Telehealth: Payer: Self-pay | Admitting: Cardiovascular Disease

## 2015-06-26 VITALS — BP 134/84 | HR 70 | Ht 71.0 in | Wt 235.5 lb

## 2015-06-26 DIAGNOSIS — I10 Essential (primary) hypertension: Secondary | ICD-10-CM | POA: Diagnosis not present

## 2015-06-26 DIAGNOSIS — I701 Atherosclerosis of renal artery: Secondary | ICD-10-CM | POA: Diagnosis not present

## 2015-06-26 DIAGNOSIS — I2510 Atherosclerotic heart disease of native coronary artery without angina pectoris: Secondary | ICD-10-CM | POA: Diagnosis not present

## 2015-06-26 DIAGNOSIS — E785 Hyperlipidemia, unspecified: Secondary | ICD-10-CM | POA: Diagnosis not present

## 2015-06-26 DIAGNOSIS — I6523 Occlusion and stenosis of bilateral carotid arteries: Secondary | ICD-10-CM

## 2015-06-26 DIAGNOSIS — Z9861 Coronary angioplasty status: Secondary | ICD-10-CM

## 2015-06-26 DIAGNOSIS — I739 Peripheral vascular disease, unspecified: Secondary | ICD-10-CM

## 2015-06-26 DIAGNOSIS — I779 Disorder of arteries and arterioles, unspecified: Secondary | ICD-10-CM

## 2015-06-26 NOTE — Patient Instructions (Addendum)

## 2015-06-26 NOTE — Telephone Encounter (Signed)
Follow Up ° °Pt returned call//  °

## 2015-06-26 NOTE — Assessment & Plan Note (Signed)
History of moderately severe right and moderate left ICA stenosis by duplex ultrasound recently checked 12/29/14. This will be followed up in 6 months. He is neurologically asymptomatic.

## 2015-06-26 NOTE — Assessment & Plan Note (Signed)
History of bilateral renal artery stenosis status post bilateral renal artery stenting in 2009. He had "in-stent restenosis with redilatation of his right left renal arteries 02/15/14 using "Angiosculpt on the right and an ICast  covered stent on the left. Recent Dopplers performed 06/08/15 revealed the stent to be patent however the kidney dimensions have somewhat fallen for unclear reasons.

## 2015-06-26 NOTE — Assessment & Plan Note (Signed)
History of hyperlipidemia on simvastatin with recent lipid profile performed 12/05/14 revealing a total cholesterol 103, LDL 51 and HDL of 40

## 2015-06-26 NOTE — Progress Notes (Signed)
06/26/2015 [name]   [birth year]  [id number]  Primary Physician Steven Morel, MD Primary Cardiologist: Steven Harp MD [name]   HPI:  The patient is a very pleasant 70 year old, moderately overweight, married Caucasian male, father of 2 who I last saw in the office 11/22/14.  He has a history of persistent hypertension status post bilateral renal artery PTA and stenting with a positive Myoview, as well as heart catheterization that showed a high-grade distal RCA stenosis. I stented both his renal arteries successfully but was unable to cross his distal RCA, which Dr. Claiborne Billings subsequently performed successfully on March 28, 2008, with a Xience V drug-eluting stent with an excellent result. He clinically improved. He had an excellent lipid profile. Renal Dopplers performed January 31, 2011, showed progression of disease in both renal arteries suggesting high "in-stent restenosis." I performed angiography on him October 18 revealing 90% right and 70% left in-stent restenosis with 60 and 40-mm gradients, respectively. He underwent AngioSculpt cutting balloon atherectomy of both renal arteries with an excellent angiographic and followup Doppler result. His blood pressure has remained stable. He is otherwise asymptomatic. He was changed to generic antihypertensive medications off of Tribenzor and apparently his blood pressures have been more difficult to control since that time. He does admit to dietary indiscretion with regards to salt as well.  I saw Steven Scott 04/19/12. He denies chest pain or shortness of breath. Apparently his creatinine has slowly increased now to close to 2. Recent renal Dopplers performed in our office 11/19/12 suggest rapid progression of the in-stent restenosis and throat within the left renal artery stent with a renal/aortic ratio of 7.33. Based on this, I re-angiogram him on 12/14/12 revealing an 80-90% mid in-stent restenosis" within the  left renal artery stent which I restented with a ICast covered stent. His right renal artery had 60-70% ostial stenosis at that time. I performed cutting balloon angioplasty on the right renal artery in-stent restenosis.Followup Dopplers performed last month revealed normalization of his left renal artery to aortic ratio with mild progression of disease on the right. Since I saw him in the office 6 months ago he's done well with recent Dopplers that suggest patent renal stents although his renal dimensions have mildly decreased.  Current Outpatient Prescriptions  Medication Sig Dispense Refill  . acetaminophen (TYLENOL) 500 MG tablet Take 500 mg by mouth every 6 (six) hours as needed for moderate pain.    Marland Kitchen aspirin EC 81 MG tablet Take 1 tablet (81 mg total) by mouth daily. Start on 11/15/14 30 tablet 1  . BYSTOLIC 5 MG tablet TAKE 1 TABLET BY MOUTH DAILY WITH BREAKFAST. 30 tablet 8  . cholecalciferol (VITAMIN D) 1000 UNITS tablet Take 1,000 Units by mouth daily.    . clobetasol cream (TEMOVATE) AB-123456789 % Apply 1 application topically daily as needed (Psoriasis).    . clopidogrel (PLAVIX) 75 MG tablet TAKE 1 TABLET BY MOUTH AT BEDTIME. 30 tablet 8  . diphenhydrAMINE (SOMINEX) 25 MG tablet Take 50 mg by mouth at bedtime as needed for sleep.    . nitroGLYCERIN (NITROSTAT) 0.4 MG SL tablet Place 0.4 mg under the tongue every 5 (five) minutes as needed for chest pain.     . Olmesartan-Amlodipine-HCTZ 40-10-25 MG TABS TAKE 1 TABLET BY MOUTH DAILY WITH BREAKFAST. 30 tablet 1  . pantoprazole (PROTONIX) 40 MG tablet Take 1 tablet (40 mg total) by mouth daily. 30 tablet 5  . simvastatin (ZOCOR) 20 MG tablet TAKE (  1) TABLET BY MOUTH AT BEDTIME. 30 tablet 8   No current facility-administered medications for this visit.    Allergies  Allergen Reactions  . Penicillins Rash    Social History   Social History  . Marital Status: Married    Spouse Name: N/A  . Number of Children: N/A  . Years of Education:  N/A   Occupational History  . Not on file.   Social History Main Topics  . Smoking status: Former Smoker -- 2.00 packs/day for 50 years    Types: Cigarettes    Quit date: 02/24/2011  . Smokeless tobacco: Never Used  . Alcohol Use: No     Comment: 12/13/2012 "last alcohol was ~ 4 yr ago"  . Drug Use: No  . Sexual Activity: Not Currently   Other Topics Concern  . Not on file   Social History Narrative     Review of Systems: General: negative for chills, fever, night sweats or weight changes.  Cardiovascular: negative for chest pain, dyspnea on exertion, edema, orthopnea, palpitations, paroxysmal nocturnal dyspnea or shortness of breath Dermatological: negative for rash Respiratory: negative for cough or wheezing Urologic: negative for hematuria Abdominal: negative for nausea, vomiting, diarrhea, bright red blood per rectum, melena, or hematemesis Neurologic: negative for visual changes, syncope, or dizziness All other systems reviewed and are otherwise negative except as noted above.    Blood pressure 134/84, pulse 70, height 5\' 11"  (1.803 m), weight 235 lb 8 oz (106.822 kg).  General appearance: alert and no distress Neck: no adenopathy, no carotid bruit, no JVD, supple, symmetrical, trachea midline and thyroid not enlarged, symmetric, no tenderness/mass/nodules Lungs: clear to auscultation bilaterally Heart: regular rate and rhythm, S1, S2 normal, no murmur, click, rub or gallop Extremities: extremities normal, atraumatic, no cyanosis or edema  EKG not performed today  ASSESSMENT AND PLAN:   Hyperlipidemia History of hyperlipidemia on simvastatin with recent lipid profile performed 12/05/14 revealing a total cholesterol 103, LDL 51 and HDL of 40  Essential hypertension History of hypertension blood pressure measured at 134/84. He is on Bystolic , Olmasartan and amlodipine. Continue current drugs at current dosing  Renal artery stenosis History of bilateral renal artery  stenosis status post bilateral renal artery stenting in 2009. He had "in-stent restenosis with redilatation of his right left renal arteries 02/15/14 using "Angiosculpt on the right and an ICast  covered stent on the left. Recent Dopplers performed 06/08/15 revealed the stent to be patent however the kidney dimensions have somewhat fallen for unclear reasons.  HLD (hyperlipidemia) History of hyperlipidemia  On statin therapy with recent lipid profile performed 7/26 and 16 regular total cholesterol 103, LDL 51 and HDL of 40  CAD -S/P RCA DES 2010 History of coronary artery disease status post RCA stenting by Dr. Claiborne Billings 03/28/08 with a drug-eluting stent. He denies chest pain or shortness of breath.      Steven Harp MD FACP,FACC,FAHA, [facility] 06/26/2015 11:31 AM

## 2015-06-26 NOTE — Assessment & Plan Note (Signed)
History of hyperlipidemia  On statin therapy with recent lipid profile performed 7/26 and 16 regular total cholesterol 103, LDL 51 and HDL of 40

## 2015-06-26 NOTE — Telephone Encounter (Signed)
Called patient and left voicemail to schedule his carotid doppler test.

## 2015-06-26 NOTE — Assessment & Plan Note (Signed)
History of coronary artery disease status post RCA stenting by Dr. Claiborne Billings 03/28/08 with a drug-eluting stent. He denies chest pain or shortness of breath.

## 2015-06-26 NOTE — Assessment & Plan Note (Signed)
History of hypertension blood pressure measured at 134/84. He is on Bystolic , Olmasartan and amlodipine. Continue current drugs at current dosing

## 2015-07-05 ENCOUNTER — Ambulatory Visit: Payer: Medicare Other

## 2015-07-05 DIAGNOSIS — I2510 Atherosclerotic heart disease of native coronary artery without angina pectoris: Secondary | ICD-10-CM

## 2015-07-05 DIAGNOSIS — I10 Essential (primary) hypertension: Secondary | ICD-10-CM

## 2015-07-05 DIAGNOSIS — I779 Disorder of arteries and arterioles, unspecified: Secondary | ICD-10-CM

## 2015-07-05 DIAGNOSIS — I6523 Occlusion and stenosis of bilateral carotid arteries: Secondary | ICD-10-CM | POA: Diagnosis not present

## 2015-07-05 DIAGNOSIS — Z9861 Coronary angioplasty status: Secondary | ICD-10-CM

## 2015-07-05 DIAGNOSIS — I701 Atherosclerosis of renal artery: Secondary | ICD-10-CM

## 2015-07-05 DIAGNOSIS — I739 Peripheral vascular disease, unspecified: Secondary | ICD-10-CM

## 2015-07-05 DIAGNOSIS — E785 Hyperlipidemia, unspecified: Secondary | ICD-10-CM

## 2015-07-20 DIAGNOSIS — N183 Chronic kidney disease, stage 3 (moderate): Secondary | ICD-10-CM | POA: Diagnosis not present

## 2015-07-20 DIAGNOSIS — Z79899 Other long term (current) drug therapy: Secondary | ICD-10-CM | POA: Diagnosis not present

## 2015-07-20 DIAGNOSIS — I129 Hypertensive chronic kidney disease with stage 1 through stage 4 chronic kidney disease, or unspecified chronic kidney disease: Secondary | ICD-10-CM | POA: Diagnosis not present

## 2015-07-20 DIAGNOSIS — D509 Iron deficiency anemia, unspecified: Secondary | ICD-10-CM | POA: Diagnosis not present

## 2015-07-20 DIAGNOSIS — E559 Vitamin D deficiency, unspecified: Secondary | ICD-10-CM | POA: Diagnosis not present

## 2015-07-20 DIAGNOSIS — R809 Proteinuria, unspecified: Secondary | ICD-10-CM | POA: Diagnosis not present

## 2015-07-31 DIAGNOSIS — I129 Hypertensive chronic kidney disease with stage 1 through stage 4 chronic kidney disease, or unspecified chronic kidney disease: Secondary | ICD-10-CM | POA: Diagnosis not present

## 2015-07-31 DIAGNOSIS — D649 Anemia, unspecified: Secondary | ICD-10-CM | POA: Diagnosis not present

## 2015-07-31 DIAGNOSIS — R809 Proteinuria, unspecified: Secondary | ICD-10-CM | POA: Diagnosis not present

## 2015-07-31 DIAGNOSIS — E669 Obesity, unspecified: Secondary | ICD-10-CM | POA: Diagnosis not present

## 2015-08-03 DIAGNOSIS — Z0000 Encounter for general adult medical examination without abnormal findings: Secondary | ICD-10-CM | POA: Diagnosis not present

## 2015-08-03 DIAGNOSIS — Z1389 Encounter for screening for other disorder: Secondary | ICD-10-CM | POA: Diagnosis not present

## 2015-08-03 DIAGNOSIS — Z6832 Body mass index (BMI) 32.0-32.9, adult: Secondary | ICD-10-CM | POA: Diagnosis not present

## 2015-08-07 ENCOUNTER — Encounter: Payer: Self-pay | Admitting: Cardiovascular Disease

## 2015-08-13 ENCOUNTER — Other Ambulatory Visit: Payer: Self-pay | Admitting: Cardiovascular Disease

## 2015-08-13 ENCOUNTER — Other Ambulatory Visit (INDEPENDENT_AMBULATORY_CARE_PROVIDER_SITE_OTHER): Payer: Self-pay | Admitting: Internal Medicine

## 2015-08-14 ENCOUNTER — Ambulatory Visit: Payer: Medicare Other | Admitting: Cardiovascular Disease

## 2015-08-22 ENCOUNTER — Telehealth: Payer: Self-pay | Admitting: Cardiovascular Disease

## 2015-08-22 NOTE — Telephone Encounter (Signed)
Pt calling back to reschedule his appt with Dr Gwenlyn Found from this Friday. Call transferred to scheduling.

## 2015-08-22 NOTE — Telephone Encounter (Signed)
Follow up   Pt returning rn call

## 2015-08-24 ENCOUNTER — Ambulatory Visit: Payer: Medicare Other | Admitting: Cardiovascular Disease

## 2015-09-04 ENCOUNTER — Encounter: Payer: Self-pay | Admitting: Cardiovascular Disease

## 2015-09-04 ENCOUNTER — Ambulatory Visit (INDEPENDENT_AMBULATORY_CARE_PROVIDER_SITE_OTHER): Payer: Medicare Other | Admitting: Cardiovascular Disease

## 2015-09-04 DIAGNOSIS — I2510 Atherosclerotic heart disease of native coronary artery without angina pectoris: Secondary | ICD-10-CM

## 2015-09-04 DIAGNOSIS — Z9861 Coronary angioplasty status: Secondary | ICD-10-CM

## 2015-09-04 DIAGNOSIS — R42 Dizziness and giddiness: Secondary | ICD-10-CM

## 2015-09-04 NOTE — Progress Notes (Signed)
Steven Scott returns today for follow-up of his carotid Doppler studies which show significant progression on the right side probably in the 80-90% range.He is asymptomatic but will require endarterectomy. I'm going to obtain a 2-D echo and a follow-up Myoview stress test to preoperatively risk stratify him and I'm going to refer him to Dr. [name] for surgical evaluation.

## 2015-09-04 NOTE — Assessment & Plan Note (Signed)
Steven Scott returns today for follow-up of his carotid Doppler studies which show significant progression on the right side probably in the 80-90% range.He is asymptomatic but will require endarterectomy. I'm going to obtain a 2-D echo and a follow-up Myoview stress test to preoperatively risk stratify him and I'm going to refer him to Dr. [name] for surgical evaluation.

## 2015-09-04 NOTE — Patient Instructions (Signed)
Medication Instructions:  Your physician recommends that you continue on your current medications as directed. Please refer to the Current Medication list given to you today.   Labwork: none  Testing/Procedures: Your physician has requested that you have an echocardiogram. Echocardiography is a painless test that uses sound waves to create images of your heart. It provides your doctor with information about the size and shape of your heart and how well your heart's chambers and valves are working. This procedure takes approximately one hour. There are no restrictions for this procedure.  Your physician has requested that you have a lexiscan myoview. For further information please visit [email or web address]. Please follow instruction sheet, as given.    Follow-Up: Your physician wants you to follow-up in: 6 months with Dr Gwenlyn Found. You will receive a reminder letter in the mail two months in advance. If you don't receive a letter, please call our office to schedule the follow-up appointment.  You have been referred to Dr [name]- discuss carotid procedure.   Any Other Special Instructions Will Be Listed Below (If Applicable).     If you need a refill on your cardiac medications before your next appointment, please call your pharmacy.

## 2015-09-05 ENCOUNTER — Other Ambulatory Visit: Payer: Self-pay | Admitting: *Deleted

## 2015-09-05 ENCOUNTER — Encounter: Payer: Self-pay | Admitting: Vascular Surgery

## 2015-09-05 DIAGNOSIS — I6523 Occlusion and stenosis of bilateral carotid arteries: Secondary | ICD-10-CM

## 2015-09-06 ENCOUNTER — Ambulatory Visit (INDEPENDENT_AMBULATORY_CARE_PROVIDER_SITE_OTHER): Payer: Medicare Other | Admitting: Vascular Surgery

## 2015-09-06 ENCOUNTER — Encounter: Payer: Self-pay | Admitting: Vascular Surgery

## 2015-09-06 ENCOUNTER — Ambulatory Visit (HOSPITAL_COMMUNITY)
Admission: RE | Admit: 2015-09-06 | Discharge: 2015-09-06 | Disposition: A | Discharge status: Home / Self Care | Payer: Medicare Other | Source: Ambulatory Visit | Attending: Vascular Surgery | Admitting: Vascular Surgery

## 2015-09-06 VITALS — BP 144/78 | HR 79 | Temp 97.1°F | Resp 16 | Ht 71.0 in | Wt 232.0 lb

## 2015-09-06 DIAGNOSIS — I701 Atherosclerosis of renal artery: Secondary | ICD-10-CM

## 2015-09-06 DIAGNOSIS — I2510 Atherosclerotic heart disease of native coronary artery without angina pectoris: Secondary | ICD-10-CM | POA: Insufficient documentation

## 2015-09-06 DIAGNOSIS — I6523 Occlusion and stenosis of bilateral carotid arteries: Secondary | ICD-10-CM

## 2015-09-06 DIAGNOSIS — E785 Hyperlipidemia, unspecified: Secondary | ICD-10-CM | POA: Diagnosis not present

## 2015-09-06 DIAGNOSIS — I6521 Occlusion and stenosis of right carotid artery: Secondary | ICD-10-CM | POA: Diagnosis not present

## 2015-09-06 DIAGNOSIS — I10 Essential (primary) hypertension: Secondary | ICD-10-CM | POA: Diagnosis not present

## 2015-09-06 NOTE — Progress Notes (Signed)
Referring Physician: Caren Macadam, MD  Patient name: Steven Scott MRN: [id number] DOB: Aug 22, 1945 Sex: male  REASON FOR CONSULT: Asymptomatic right internal carotid artery stenosis  HPI: Steven Scott is a 70 y.o. male who has been followed by Dr. Gwenlyn Found for an asymptomatic right internal carotid artery stenosis. This has now progressed to greater than 80%. He denies any prior history of TIA amaurosis or stroke. He is currently on aspirin and Plavix. He is also on a statin. Other medical problems include coronary artery disease, hypertension, renal artery stenosis  (previous angioplasty), renal dysfunction followed by Dr Hinda Lenis.  These have all been stable. The patient does have a history of peptic ulcer disease and did have a bleeding episode several months ago but this was treated with medication and he did not require transfusion.  Past Medical History  Diagnosis Date  . Coronary artery disease   . Hypertension   . Renal artery stenosis (HCC)     Multiple interventions  . Status post percutaneous angioplasty of renal artery 10/18 2012    PV angiogram preformed revealing 90% right and 70% left in stent restenosis with 25mm and 50mm gradients respectively.  . Hyperlipidemia   . Shortness of breath     "at any time" (12/13/2012)  . Carotid artery occlusion    Past Surgical History  Procedure Laterality Date  . Lithotripsy      "laser" (12/13/2012)  . Colonoscopy  12/18/2010    Procedure: COLONOSCOPY;  Surgeon: Rogene Houston, MD;  [facility];  Service: Endoscopy;  Laterality: N/A;  . Coronary angioplasty with stent placement  03/28/2009    Successful PCI od high-grade 95% eccentric tandem plague stenosis in the distal right coronary artery with ultimate insertion of a 3.0 x 18 mm Xience V DES stent postdilated at 3.46 mm with the 99% stenosis being reduced to 0%  . Cardiac catheterization  03/02/2009    has moderate circumflex and diagonal branch disease with  high-grade distal right disease and fail attempt at PCI  . Renal artery stent Bilateral 2010  . Renal artery angioplasty Bilateral 02/2014    PV restenting L RA with an ICast covered stent and cutting balloon atherectomy R RA  . Renal angiogram N/A 12/14/2012    Procedure: RENAL ANGIOGRAM;  Surgeon: [name], MD;  Location: [facility] C[facility] LAB;  Service: Cardiovascular;  Laterality: N/A;  . Percutaneous stent intervention Left 12/14/2012    Procedure: PERCUTANEOUS STENT INTERVENTION;  Surgeon: [name], MD;  Location[facility] CATH LAB;  Service: Cardiovascular;  Laterality: Left;  left renal artery  . Renal angiogram N/A 02/16/2014    Procedure: RENAL ANGIOGRAM;  Surgeon: [name], MD;  Location: [facility] CATH LAB;  Service: Cardiovascular;  Laterality: N/A;  . Percutaneous stent intervention  02/16/2014    Procedure: PERCUTANEOUS STENT INTERVENTION;  Surgeon: [name], MD;  Location: [facility] CATH LAB;  Service: Cardiovascular;;  left renal  . Esophagogastroduodenoscopy N/A 11/08/2014    Procedure: ESOPHAGOGASTRODUODENOSCOPY (EGD);  Surgeon: Rogene Houston, MD;  [facility];  Service: Endoscopy;  Laterality: N/A;  . Esophagogastroduodenoscopy N/A 03/15/2015    Procedure: ESOPHAGOGASTRODUODENOSCOPY (EGD);  Surgeon: Rogene Houston, MD;  Location: [facility];  Service: Endoscopy;  Laterality: N/A;  1255    Family History  Problem Relation Age of Onset  . COPD Father 17    deceased    SOCIAL HISTORY: Social History   Social History  . Marital  Status: Married    Spouse Name: N/A  . Number of Children: N/A  . Years of Education: N/A   Occupational History  . Not on file.   Social History Main Topics  . Smoking status: Former Smoker -- 2.00 packs/day for 50 years    Types: Cigarettes    Quit date: 02/24/2011  . Smokeless tobacco: Never Used  . Alcohol Use: No     Comment: 12/13/2012 "last alcohol was ~ 4 yr ago"  . Drug Use: No  . Sexual Activity: Not Currently    Other Topics Concern  . Not on file   Social History Narrative    Allergies  Allergen Reactions  . Penicillins Rash    Current Outpatient Prescriptions  Medication Sig Dispense Refill  . acetaminophen (TYLENOL) 500 MG tablet Take 500 mg by mouth every 6 (six) hours as needed for moderate pain.    Marland Kitchen aspirin EC 81 MG tablet Take 1 tablet (81 mg total) by mouth daily. Start on 11/15/14 30 tablet 1  . BYSTOLIC 5 MG tablet TAKE 1 TABLET BY MOUTH DAILY WITH BREAKFAST. 30 tablet 8  . cholecalciferol (VITAMIN D) 1000 UNITS tablet Take 1,000 Units by mouth daily.    . clobetasol cream (TEMOVATE) [id number] % Apply 1 application topically daily as needed (Psoriasis).    . clopidogrel (PLAVIX) 75 MG tablet TAKE 1 TABLET BY MOUTH AT BEDTIME. 30 tablet 8  . Olmesartan-Amlodipine-HCTZ 40-10-25 MG TABS TAKE 1 TABLET BY MOUTH DAILY WITH BREAKFAST. 30 tablet 4  . pantoprazole (PROTONIX) 40 MG tablet TAKE ONE TABLET BY MOUTH ONCE DAILY. 30 tablet 0  . simvastatin (ZOCOR) 20 MG tablet TAKE (1) TABLET BY MOUTH AT BEDTIME. 30 tablet 8   No current facility-administered medications for this visit.    ROS:   General:  No weight loss, Fever, chills  HEENT: No recent headaches, no nasal bleeding, no visual changes, no sore throat  Neurologic: No dizziness, blackouts, seizures. No recent symptoms of stroke or mini- stroke. No recent episodes of slurred speech, or temporary blindness.  Cardiac: No recent episodes of chest pain/pressure, no shortness of breath at rest.  No shortness of breath with exertion.  Denies history of atrial fibrillation or irregular heartbeat  Vascular: No history of rest pain in feet.  No history of claudication.  No history of non-healing ulcer, No history of DVT   Pulmonary: No home oxygen, no productive cough, no hemoptysis,  No asthma or wheezing  Musculoskeletal:  [ ]  Arthritis, [ ]  Low back pain,  [ ]  Joint pain  Hematologic:No history of hypercoagulable state.  No history  of easy bleeding.  No history of anemia  Gastrointestinal: No hematochezia or melena,  No gastroesophageal reflux, no trouble swallowing  Urinary: [ ]  chronic Kidney disease, [ ]  on HD - [ ]  MWF or [ ]  TTHS, [ ]  Burning with urination, [ ]  Frequent urination, [ ]  Difficulty urinating;   Skin: No rashes  Psychological: No history of anxiety,  No history of depression   Physical Examination  Filed Vitals:   09/06/15 1341 09/06/15 1347 09/06/15 1348  BP: 149/78 145/78 144/78  Pulse: 79 79 79  Temp: 97.1 F (36.2 C)    Resp: 16    Height: 5\' 11"  (1.803 m)    Weight: 232 lb (105.235 kg)    SpO2: 99%      Body mass index is 32.37 kg/(m^2).  General:  Alert and oriented, no acute distress HEENT: Normal Neck: No bruit  or JVD Pulmonary: Clear to auscultation bilaterally Cardiac: Regular Rate and Rhythm without murmur Abdomen: Soft, non-tender, non-distended, no mass, obese  Skin: No rash Extremity Pulses:  2+ radial, brachial, femoral, absent dorsalis pedis, posterior tibial pulses bilaterally Musculoskeletal: No deformity or edema  Neurologic: Upper and lower extremity motor 5/5 and symmetric  DATA:  Limited carotid duplex was performed today on the right side. This showed velocities in the right side 466/141 with normal carotid artery distal to the plaque. This is compared to previous carotid duplex performed at the [facility] vascular lab which showed a velocity on the right side 424/159, left side was 40-60% vertebral arteries were antegrade bilaterally  ASSESSMENT:  Asymptomatic high-grade right internal carotid artery stenosis   PLAN:  Patient will be scheduled for right carotid endarterectomy 09/25/2015 pending his cardiac workup which is scheduled to be completed on May 11. Risks benefits possible complications and procedure details including not limited to stroke risk bleeding infection cranial nerve injury myocardial events were discussed with the patient today. He understands  and agrees to proceed. We will continue his aspirin perioperatively. We will stop his Plavix 7 days prior to the procedure and resume this postoperatively.   Ruta Hinds, MD [facility] Office: [phone] Pager: [phone]

## 2015-09-07 DIAGNOSIS — N183 Chronic kidney disease, stage 3 (moderate): Secondary | ICD-10-CM | POA: Diagnosis not present

## 2015-09-07 DIAGNOSIS — Z79899 Other long term (current) drug therapy: Secondary | ICD-10-CM | POA: Diagnosis not present

## 2015-09-07 DIAGNOSIS — I129 Hypertensive chronic kidney disease with stage 1 through stage 4 chronic kidney disease, or unspecified chronic kidney disease: Secondary | ICD-10-CM | POA: Diagnosis not present

## 2015-09-11 ENCOUNTER — Other Ambulatory Visit: Payer: Self-pay

## 2015-09-19 ENCOUNTER — Encounter (HOSPITAL_COMMUNITY)
Admission: RE | Admit: 2015-09-19 | Discharge: 2015-09-19 | Disposition: A | Discharge status: Home / Self Care | Payer: Medicare Other | Source: Ambulatory Visit | Attending: Vascular Surgery | Admitting: Vascular Surgery

## 2015-09-19 ENCOUNTER — Encounter (HOSPITAL_COMMUNITY): Payer: Self-pay

## 2015-09-19 DIAGNOSIS — Z9861 Coronary angioplasty status: Secondary | ICD-10-CM | POA: Diagnosis not present

## 2015-09-19 DIAGNOSIS — E669 Obesity, unspecified: Secondary | ICD-10-CM | POA: Diagnosis not present

## 2015-09-19 DIAGNOSIS — I739 Peripheral vascular disease, unspecified: Secondary | ICD-10-CM | POA: Diagnosis not present

## 2015-09-19 DIAGNOSIS — R5383 Other fatigue: Secondary | ICD-10-CM | POA: Diagnosis not present

## 2015-09-19 DIAGNOSIS — I129 Hypertensive chronic kidney disease with stage 1 through stage 4 chronic kidney disease, or unspecified chronic kidney disease: Secondary | ICD-10-CM | POA: Diagnosis not present

## 2015-09-19 DIAGNOSIS — N189 Chronic kidney disease, unspecified: Secondary | ICD-10-CM | POA: Diagnosis not present

## 2015-09-19 DIAGNOSIS — R0609 Other forms of dyspnea: Secondary | ICD-10-CM | POA: Diagnosis not present

## 2015-09-19 DIAGNOSIS — Z87891 Personal history of nicotine dependence: Secondary | ICD-10-CM | POA: Diagnosis not present

## 2015-09-19 DIAGNOSIS — R0602 Shortness of breath: Secondary | ICD-10-CM | POA: Diagnosis not present

## 2015-09-19 DIAGNOSIS — I779 Disorder of arteries and arterioles, unspecified: Secondary | ICD-10-CM | POA: Diagnosis not present

## 2015-09-19 DIAGNOSIS — Z6832 Body mass index (BMI) 32.0-32.9, adult: Secondary | ICD-10-CM | POA: Diagnosis not present

## 2015-09-19 DIAGNOSIS — I2510 Atherosclerotic heart disease of native coronary artery without angina pectoris: Secondary | ICD-10-CM | POA: Diagnosis not present

## 2015-09-19 DIAGNOSIS — R42 Dizziness and giddiness: Secondary | ICD-10-CM | POA: Diagnosis not present

## 2015-09-19 HISTORY — DX: Headache: R51

## 2015-09-19 HISTORY — DX: Headache, unspecified: R51.9

## 2015-09-19 HISTORY — DX: Gastro-esophageal reflux disease without esophagitis: K21.9

## 2015-09-19 HISTORY — DX: Other seasonal allergic rhinitis: J30.2

## 2015-09-19 HISTORY — DX: Personal history of peptic ulcer disease: Z87.11

## 2015-09-19 HISTORY — DX: Unspecified osteoarthritis, unspecified site: M19.90

## 2015-09-19 HISTORY — DX: Unspecified atrial fibrillation: I48.91

## 2015-09-19 LAB — CBC
HEMATOCRIT: 46.1 % (ref 39.0–52.0)
HEMOGLOBIN: 14.9 g/dL (ref 13.0–17.0)
MCH: 28.4 pg (ref 26.0–34.0)
MCHC: 32.3 g/dL (ref 30.0–36.0)
MCV: 87.8 fL (ref 78.0–100.0)
Platelets: 208 10*3/uL (ref 150–400)
RBC: 5.25 MIL/uL (ref 4.22–5.81)
RDW: 14.4 % (ref 11.5–15.5)
WBC: 8 10*3/uL (ref 4.0–10.5)

## 2015-09-19 LAB — SURGICAL PCR SCREEN
MRSA, PCR: NEGATIVE
Staphylococcus aureus: POSITIVE — AB

## 2015-09-19 LAB — TYPE AND SCREEN
ABO/RH(D): O NEG
ANTIBODY SCREEN: NEGATIVE

## 2015-09-19 LAB — COMPREHENSIVE METABOLIC PANEL
ALBUMIN: 3.8 g/dL (ref 3.5–5.0)
ALT: 16 U/L — AB (ref 17–63)
AST: 21 U/L (ref 15–41)
Alkaline Phosphatase: 61 U/L (ref 38–126)
Anion gap: 10 (ref 5–15)
BILIRUBIN TOTAL: 0.7 mg/dL (ref 0.3–1.2)
BUN: 28 mg/dL — ABNORMAL HIGH (ref 6–20)
CO2: 26 mmol/L (ref 22–32)
CREATININE: 2.18 mg/dL — AB (ref 0.61–1.24)
Calcium: 9.4 mg/dL (ref 8.9–10.3)
Chloride: 104 mmol/L (ref 101–111)
GFR calc Af Amer: 33 mL/min — ABNORMAL LOW (ref >60)
GFR, EST NON AFRICAN AMERICAN: 29 mL/min — AB (ref >60)
GLUCOSE: 108 mg/dL — AB (ref 65–99)
POTASSIUM: 4.5 mmol/L (ref 3.5–5.1)
Sodium: 140 mmol/L (ref 135–145)
TOTAL PROTEIN: 6.8 g/dL (ref 6.5–8.1)

## 2015-09-19 LAB — PROTIME-INR
INR: 1.24 (ref 0.00–1.49)
PROTHROMBIN TIME: 15.8 s — AB (ref 11.6–15.2)

## 2015-09-19 LAB — APTT: APTT: 37 s (ref 24–37)

## 2015-09-19 LAB — ABO/RH: ABO/RH(D): O NEG

## 2015-09-19 NOTE — Progress Notes (Signed)
Called Mr Buenafe and informed of pcr results, and the need to start  Mupirocin, voices understanding. [facility] called and info given for script. Mr Pignatelli did ask to have medication delivered, informed the Pharmacy of his request.

## 2015-09-19 NOTE — Pre-Procedure Instructions (Signed)
Steven Scott  09/19/2015       [facility] [place] [place] - [address] [place] [address] Phone: [phone] Fax: [phone]  [place], [place] [address] [place] [address] [place] [address] Phone: [phone] Fax: [phone]    Your procedure is scheduled on May 17  Report to [facility] at 630 A.M.  Call this number if you have problems the morning of surgery:  [phone]   Remember:  Do not eat food or drink liquids after midnight.  Take these medicines the morning of surgery with A SIP OF WATER Tylenol if needed, Bystolic, Pantoprazole(Protonix)   Stop taking aspirin, Ibuprofen, Advil, Motrin, BC's, Goody's, Herbal medications, Fish Oil, Aleve Stop taking Plavix as directed by your Dr.    Lazaro Scott not wear jewelry, make-up or nail polish.  Do not wear lotions, powders, or perfumes.  You may wear deodorant.  Do not shave 48 hours prior to surgery.  Men may shave face and neck.  Do not bring valuables to the hospital.  [facility] is not responsible for any belongings or valuables.  Contacts, dentures or bridgework may not be worn into surgery.  Leave your suitcase in the car.  After surgery it may be brought to your room.  For patients admitted to the hospital, discharge time will be determined by your treatment team.  Patients discharged the day of surgery will not be allowed to drive home.   Special instructions:  [place] - Preparing for Surgery  Before surgery, you can play an important role.  Because skin is not sterile, your skin needs to be as free of germs as possible.  You can reduce the number of germs on you skin by washing with CHG (chlorahexidine gluconate) soap before surgery.  CHG is an antiseptic cleaner which kills germs and bonds with the skin to continue killing germs even after washing.  Please DO NOT use if you have an allergy to CHG or  antibacterial soaps.  If your skin becomes reddened/irritated stop using the CHG and inform your nurse when you arrive at Short Stay.  Do not shave (including legs and underarms) for at least 48 hours prior to the first CHG shower.  You may shave your face.  Please follow these instructions carefully:   1.  Shower with CHG Soap the night before surgery and the   morning of Surgery.  2.  If you choose to wash your hair, wash your hair first as usual with your  normal shampoo.  3.  After you shampoo, rinse your hair and body thoroughly to remove the  Shampoo.  4.  Use CHG as you would any other liquid soap.  You can apply chg directly  to the skin and wash gently with scrungie or a clean washcloth.  5.  Apply the CHG Soap to your body ONLY FROM THE NECK DOWN.    Do not use on open wounds or open sores.  Avoid contact with your eyes,ears, mouth and genitals (private parts).  Wash genitals (private parts)   with your normal soap.  6.  Wash thoroughly, paying special attention to the area where your surgery will be performed.  7.  Thoroughly rinse your body with warm water from the neck down.  8.  DO NOT shower/wash with your normal soap after using and rinsing off  the CHG Soap.  9.  Steven Scott  yourself dry with a clean towel.            10.  Wear clean pajamas.            11.  Place clean sheets on your bed the night of your first shower and do not sleep with pets.  Day of Surgery  Do not apply any lotions/deoderants the morning of surgery.  Please wear clean clothes to the hospital/surgery center.     Please read over the following fact sheets that you were given. Pain Booklet, Coughing and Deep Breathing, Blood Transfusion Information, MRSA Information and Surgical Site Infection Prevention

## 2015-09-19 NOTE — Progress Notes (Addendum)
PCP is Dr. Gerarda Fraction Cardiologist is Dr. Gwenlyn Found Steven Scott is scheduled to have a stress test and echo 09-20-15 He reports last dose of Plavix was 09-17-2015 Pt had just went to the Bathroom- urine order placed for the day of surgery

## 2015-09-20 ENCOUNTER — Ambulatory Visit (HOSPITAL_COMMUNITY)
Admission: RE | Admit: 2015-09-20 | Discharge: 2015-09-20 | Disposition: A | Discharge status: Home / Self Care | Payer: Medicare Other | Source: Ambulatory Visit | Attending: Cardiovascular Disease | Admitting: Cardiovascular Disease

## 2015-09-20 ENCOUNTER — Encounter (HOSPITAL_COMMUNITY): Payer: Self-pay

## 2015-09-20 ENCOUNTER — Ambulatory Visit (HOSPITAL_BASED_OUTPATIENT_CLINIC_OR_DEPARTMENT_OTHER)
Admission: RE | Admit: 2015-09-20 | Discharge: 2015-09-20 | Disposition: A | Discharge status: Home / Self Care | Payer: Medicare Other | Source: Ambulatory Visit | Attending: Cardiovascular Disease | Admitting: Cardiovascular Disease

## 2015-09-20 DIAGNOSIS — R0602 Shortness of breath: Secondary | ICD-10-CM | POA: Diagnosis not present

## 2015-09-20 DIAGNOSIS — R42 Dizziness and giddiness: Secondary | ICD-10-CM

## 2015-09-20 DIAGNOSIS — Z6832 Body mass index (BMI) 32.0-32.9, adult: Secondary | ICD-10-CM | POA: Insufficient documentation

## 2015-09-20 DIAGNOSIS — N189 Chronic kidney disease, unspecified: Secondary | ICD-10-CM | POA: Insufficient documentation

## 2015-09-20 DIAGNOSIS — Z9861 Coronary angioplasty status: Secondary | ICD-10-CM

## 2015-09-20 DIAGNOSIS — I129 Hypertensive chronic kidney disease with stage 1 through stage 4 chronic kidney disease, or unspecified chronic kidney disease: Secondary | ICD-10-CM | POA: Insufficient documentation

## 2015-09-20 DIAGNOSIS — I2510 Atherosclerotic heart disease of native coronary artery without angina pectoris: Secondary | ICD-10-CM

## 2015-09-20 DIAGNOSIS — Z87891 Personal history of nicotine dependence: Secondary | ICD-10-CM | POA: Insufficient documentation

## 2015-09-20 DIAGNOSIS — E669 Obesity, unspecified: Secondary | ICD-10-CM | POA: Insufficient documentation

## 2015-09-20 DIAGNOSIS — I739 Peripheral vascular disease, unspecified: Secondary | ICD-10-CM | POA: Insufficient documentation

## 2015-09-20 DIAGNOSIS — I779 Disorder of arteries and arterioles, unspecified: Secondary | ICD-10-CM | POA: Insufficient documentation

## 2015-09-20 DIAGNOSIS — R0609 Other forms of dyspnea: Secondary | ICD-10-CM | POA: Diagnosis not present

## 2015-09-20 DIAGNOSIS — R5383 Other fatigue: Secondary | ICD-10-CM | POA: Insufficient documentation

## 2015-09-20 LAB — MYOCARDIAL PERFUSION IMAGING
CHL CUP NUCLEAR SRS: 1
CHL CUP NUCLEAR SSS: 1
CSEPPHR: 88 {beats}/min
NUC STRESS TID: 1.03
Rest HR: 71 {beats}/min
SDS: 0

## 2015-09-20 MED ORDER — TECHNETIUM TC 99M SESTAMIBI GENERIC - CARDIOLITE
10.9000 | Freq: Once | INTRAVENOUS | Status: AC | PRN
  Administered 2015-09-20: 10.9 via INTRAVENOUS

## 2015-09-20 MED ORDER — REGADENOSON 0.4 MG/5ML IV SOLN
0.4000 mg | Freq: Once | INTRAVENOUS | Status: AC
  Administered 2015-09-20: 0.4 mg via INTRAVENOUS

## 2015-09-20 MED ORDER — TECHNETIUM TC 99M SESTAMIBI GENERIC - CARDIOLITE
31.2000 | Freq: Once | INTRAVENOUS | Status: AC | PRN
  Administered 2015-09-20: 31.2 via INTRAVENOUS

## 2015-09-20 NOTE — Progress Notes (Addendum)
Anesthesia Chart Review:  Pt is a 70 year old male scheduled for R CEA on 09/26/2015 with Dr. Oneida Alar.   Nephrologist is Dr. Lowanda Foster. PCP is Dr. Redmond School. Cardiologist is Dr. Quay Burow.   PMH includes:  CAD (s/p DES to RCA 2009), atrial fibrillation, HTN, CKD (thought to be secondary to hypertensive nephrosclerosis), renal artery stenosis (s/p multiple interventions), carotid artery stenosis, hyperlipidemia, GERD. Former smoker. BMI 33  Medications include: ASA, bystolic, plavix, olmesartan-amlodipine-hctz, protonix, simvastatin. Pt to hold plavix 1 week prior to surgery.   Preoperative labs reviewed.  Cr 2.18, BUN 28, consistent with prior results.   Chest x-ray 11/07/14 reviewed. No active cardiopulmonary disease.   EKG 09/19/15: Atrial fibrillation (70 bpm). Septal infarct , age undetermined  Nuclear stress test 09/20/15: Normal stress nuclear study with no ischemia or infarction; study not gated due to atrial fibrillation.  Echo 09/20/15:  - Procedure narrative: Transthoracic echocardiography. Image quality was fair. The study was technically difficult, as a result of body habitus. The cardiac rhythm appears to be afib or a-flutter. - Left ventricle: The cavity size was normal. Wall thickness was increased in a pattern of mild LVH. Systolic function was normal. The estimated ejection fraction was in the range of 60% to 65%. The study is not technically sufficient to allow evaluation of LV diastolic function. - Mitral valve: Mildly thickened leaflets . There was trivial regurgitation. - Left atrium: The atrium was normal in size. - Atrial septum: No defect or patent foramen ovale was identified. - Tricuspid valve: There was trivial regurgitation. - Pulmonary arteries: PA peak pressure: 33 mm Hg (S). - Inferior vena cava: The vessel was normal in size. The respirophasic diameter changes were in the normal range (>= 50%), consistent with normal central venous pressure. -  Impressions: LVEF 60-65%, mild LVH, trivial MR, normal LA size, trivial TR, RVSP 33 mmHg, normal IVC. A-fib or a-flutter was noted in the exam.  Carotid duplex 09/06/15: 80-99% R ICA stenosis  If no changes, I anticipate pt can proceed with surgery as scheduled.   Willeen Cass, FNP-BC [facility] Short Stay Surgical Center/Anesthesiology Phone: [phone] 09/21/2015 2:53 PM

## 2015-09-26 ENCOUNTER — Inpatient Hospital Stay (HOSPITAL_COMMUNITY): Payer: Medicare Other | Admitting: Anesthesiology

## 2015-09-26 ENCOUNTER — Inpatient Hospital Stay (HOSPITAL_COMMUNITY)
Admission: RE | Admit: 2015-09-26 | Discharge: 2015-09-27 | DRG: 039 | Disposition: A | Discharge status: Home / Self Care | Payer: Medicare Other | Source: Ambulatory Visit | Attending: Vascular Surgery | Admitting: Vascular Surgery

## 2015-09-26 ENCOUNTER — Encounter (HOSPITAL_COMMUNITY): Payer: Self-pay | Admitting: *Deleted

## 2015-09-26 ENCOUNTER — Encounter (HOSPITAL_COMMUNITY)
Admission: RE | Disposition: A | Discharge status: Home / Self Care | Payer: Self-pay | Source: Ambulatory Visit | Attending: Vascular Surgery

## 2015-09-26 ENCOUNTER — Inpatient Hospital Stay (HOSPITAL_COMMUNITY): Payer: Medicare Other | Admitting: Emergency Medicine

## 2015-09-26 DIAGNOSIS — I10 Essential (primary) hypertension: Secondary | ICD-10-CM | POA: Diagnosis not present

## 2015-09-26 DIAGNOSIS — R590 Localized enlarged lymph nodes: Secondary | ICD-10-CM | POA: Diagnosis not present

## 2015-09-26 DIAGNOSIS — I2510 Atherosclerotic heart disease of native coronary artery without angina pectoris: Secondary | ICD-10-CM | POA: Diagnosis not present

## 2015-09-26 DIAGNOSIS — I6521 Occlusion and stenosis of right carotid artery: Principal | ICD-10-CM | POA: Diagnosis present

## 2015-09-26 DIAGNOSIS — Z7902 Long term (current) use of antithrombotics/antiplatelets: Secondary | ICD-10-CM

## 2015-09-26 DIAGNOSIS — Z7982 Long term (current) use of aspirin: Secondary | ICD-10-CM

## 2015-09-26 DIAGNOSIS — E785 Hyperlipidemia, unspecified: Secondary | ICD-10-CM | POA: Diagnosis present

## 2015-09-26 DIAGNOSIS — Z955 Presence of coronary angioplasty implant and graft: Secondary | ICD-10-CM

## 2015-09-26 DIAGNOSIS — I701 Atherosclerosis of renal artery: Secondary | ICD-10-CM | POA: Diagnosis not present

## 2015-09-26 DIAGNOSIS — Z95828 Presence of other vascular implants and grafts: Secondary | ICD-10-CM

## 2015-09-26 DIAGNOSIS — Z87891 Personal history of nicotine dependence: Secondary | ICD-10-CM | POA: Diagnosis not present

## 2015-09-26 DIAGNOSIS — M1990 Unspecified osteoarthritis, unspecified site: Secondary | ICD-10-CM | POA: Diagnosis not present

## 2015-09-26 HISTORY — PX: ENDARTERECTOMY: SHX5162

## 2015-09-26 HISTORY — PX: PATCH ANGIOPLASTY: SHX6230

## 2015-09-26 LAB — URINALYSIS, ROUTINE W REFLEX MICROSCOPIC
BILIRUBIN URINE: NEGATIVE
GLUCOSE, UA: NEGATIVE mg/dL
Hgb urine dipstick: NEGATIVE
Ketones, ur: NEGATIVE mg/dL
Leukocytes, UA: NEGATIVE
NITRITE: NEGATIVE
PH: 7 (ref 5.0–8.0)
Protein, ur: NEGATIVE mg/dL
SPECIFIC GRAVITY, URINE: 1.018 (ref 1.005–1.030)

## 2015-09-26 LAB — CBC
HCT: 33.4 % — ABNORMAL LOW (ref 39.0–52.0)
Hemoglobin: 11 g/dL — ABNORMAL LOW (ref 13.0–17.0)
MCH: 28.6 pg (ref 26.0–34.0)
MCHC: 32.9 g/dL (ref 30.0–36.0)
MCV: 86.8 fL (ref 78.0–100.0)
PLATELETS: 137 10*3/uL — AB (ref 150–400)
RBC: 3.85 MIL/uL — ABNORMAL LOW (ref 4.22–5.81)
RDW: 14.4 % (ref 11.5–15.5)
WBC: 6.7 10*3/uL (ref 4.0–10.5)

## 2015-09-26 SURGERY — ENDARTERECTOMY, CAROTID
Anesthesia: General | Site: Neck | Laterality: Right

## 2015-09-26 MED ORDER — OXYCODONE HCL 5 MG/5ML PO SOLN: 5.0000 mg | Freq: Once | ORAL | Status: AC | PRN

## 2015-09-26 MED ORDER — GLYCOPYRROLATE 0.2 MG/ML IV SOSY
PREFILLED_SYRINGE | INTRAVENOUS | Status: AC
  Filled 2015-09-26: qty 3

## 2015-09-26 MED ORDER — CLOBETASOL PROPIONATE 0.05 % EX CREA
1.0000 "application " | TOPICAL_CREAM | Freq: Every day | CUTANEOUS | Status: DC | PRN
  Filled 2015-09-26: qty 15

## 2015-09-26 MED ORDER — SUCCINYLCHOLINE CHLORIDE 20 MG/ML IJ SOLN
INTRAMUSCULAR | Status: DC | PRN
  Administered 2015-09-26: 80 mg via INTRAVENOUS

## 2015-09-26 MED ORDER — ACETAMINOPHEN 500 MG PO TABS: 500.0000 mg | ORAL_TABLET | Freq: Four times a day (QID) | ORAL | Status: DC | PRN

## 2015-09-26 MED ORDER — SODIUM CHLORIDE 0.9 % IV SOLN: INTRAVENOUS | Status: DC

## 2015-09-26 MED ORDER — OXYCODONE-ACETAMINOPHEN 5-325 MG PO TABS
1.0000 | ORAL_TABLET | ORAL | Status: DC | PRN
  Administered 2015-09-26: 2 via ORAL
  Administered 2015-09-27: 1 via ORAL
  Filled 2015-09-26: qty 1
  Filled 2015-09-26: qty 2

## 2015-09-26 MED ORDER — PROTAMINE SULFATE 10 MG/ML IV SOLN
INTRAVENOUS | Status: AC
  Filled 2015-09-26: qty 25

## 2015-09-26 MED ORDER — GUAIFENESIN-DM 100-10 MG/5ML PO SYRP: 15.0000 mL | ORAL_SOLUTION | ORAL | Status: DC | PRN

## 2015-09-26 MED ORDER — MAGNESIUM SULFATE 2 GM/50ML IV SOLN: 2.0000 g | Freq: Every day | INTRAVENOUS | Status: DC | PRN

## 2015-09-26 MED ORDER — HEPARIN SODIUM (PORCINE) 1000 UNIT/ML IJ SOLN
INTRAMUSCULAR | Status: DC | PRN
  Administered 2015-09-26: 10000 [IU] via INTRAVENOUS

## 2015-09-26 MED ORDER — LACTATED RINGERS IV SOLN
INTRAVENOUS | Status: DC | PRN
  Administered 2015-09-26 (×2): via INTRAVENOUS

## 2015-09-26 MED ORDER — ROCURONIUM BROMIDE 100 MG/10ML IV SOLN
INTRAVENOUS | Status: DC | PRN
  Administered 2015-09-26: 10 mg via INTRAVENOUS
  Administered 2015-09-26: 40 mg via INTRAVENOUS

## 2015-09-26 MED ORDER — FENTANYL CITRATE (PF) 100 MCG/2ML IJ SOLN
INTRAMUSCULAR | Status: AC
  Filled 2015-09-26: qty 2

## 2015-09-26 MED ORDER — SODIUM CHLORIDE 0.9 % IV SOLN
INTRAVENOUS | Status: DC | PRN
  Administered 2015-09-26: 500 mL

## 2015-09-26 MED ORDER — PROPOFOL 10 MG/ML IV BOLUS
INTRAVENOUS | Status: DC | PRN
  Administered 2015-09-26: 30 mg via INTRAVENOUS
  Administered 2015-09-26 (×2): 20 mg via INTRAVENOUS
  Administered 2015-09-26: 50 mg via INTRAVENOUS

## 2015-09-26 MED ORDER — ROCURONIUM BROMIDE 50 MG/5ML IV SOLN
INTRAVENOUS | Status: AC
  Filled 2015-09-26: qty 1

## 2015-09-26 MED ORDER — SODIUM CHLORIDE 0.9 % IV SOLN
0.0125 ug/kg/min | INTRAVENOUS | Status: DC
  Filled 2015-09-26: qty 2000

## 2015-09-26 MED ORDER — PROTAMINE SULFATE 10 MG/ML IV SOLN
INTRAVENOUS | Status: DC | PRN
  Administered 2015-09-26 (×10): 10 mg via INTRAVENOUS

## 2015-09-26 MED ORDER — ONDANSETRON HCL 4 MG/2ML IJ SOLN
INTRAMUSCULAR | Status: AC
  Filled 2015-09-26: qty 2

## 2015-09-26 MED ORDER — CHLORHEXIDINE GLUCONATE CLOTH 2 % EX PADS: 6.0000 | MEDICATED_PAD | Freq: Once | CUTANEOUS | Status: DC

## 2015-09-26 MED ORDER — OXYCODONE HCL 5 MG PO TABS
ORAL_TABLET | ORAL | Status: AC
  Filled 2015-09-26: qty 1

## 2015-09-26 MED ORDER — HYDROCHLOROTHIAZIDE 25 MG PO TABS
25.0000 mg | ORAL_TABLET | Freq: Every day | ORAL | Status: DC
  Administered 2015-09-27: 25 mg via ORAL
  Filled 2015-09-26: qty 1

## 2015-09-26 MED ORDER — OXYCODONE HCL 5 MG PO TABS
5.0000 mg | ORAL_TABLET | Freq: Once | ORAL | Status: AC | PRN
  Administered 2015-09-26: 5 mg via ORAL

## 2015-09-26 MED ORDER — GLYCOPYRROLATE 0.2 MG/ML IJ SOLN
INTRAMUSCULAR | Status: DC | PRN
  Administered 2015-09-26: 0.1 mg via INTRAVENOUS
  Administered 2015-09-26: 0.6 mg via INTRAVENOUS

## 2015-09-26 MED ORDER — SODIUM CHLORIDE 0.9 % IV SOLN
1250.0000 mg | Freq: Once | INTRAVENOUS | Status: AC
  Administered 2015-09-27: 1250 mg via INTRAVENOUS
  Filled 2015-09-26: qty 1250

## 2015-09-26 MED ORDER — LIDOCAINE HCL (PF) 1 % IJ SOLN
INTRAMUSCULAR | Status: AC
  Filled 2015-09-26: qty 30

## 2015-09-26 MED ORDER — SODIUM CHLORIDE 0.9 % IV SOLN
INTRAVENOUS | Status: DC
  Administered 2015-09-26: 19:00:00 via INTRAVENOUS

## 2015-09-26 MED ORDER — NEOSTIGMINE METHYLSULFATE 5 MG/5ML IV SOSY
PREFILLED_SYRINGE | INTRAVENOUS | Status: AC
  Filled 2015-09-26: qty 5

## 2015-09-26 MED ORDER — POTASSIUM CHLORIDE CRYS ER 20 MEQ PO TBCR
20.0000 meq | EXTENDED_RELEASE_TABLET | Freq: Every day | ORAL | Status: DC | PRN
Start: 2015-09-26 — End: 2015-09-27

## 2015-09-26 MED ORDER — OXYCODONE-ACETAMINOPHEN 5-325 MG PO TABS: 1.0000 | ORAL_TABLET | Freq: Four times a day (QID) | ORAL | Status: DC | PRN

## 2015-09-26 MED ORDER — MORPHINE SULFATE (PF) 2 MG/ML IV SOLN: 2.0000 mg | INTRAVENOUS | Status: DC | PRN

## 2015-09-26 MED ORDER — ACETAMINOPHEN 325 MG PO TABS: 325.0000 mg | ORAL_TABLET | ORAL | Status: DC | PRN

## 2015-09-26 MED ORDER — SODIUM CHLORIDE 0.9 % IV SOLN
0.0125 ug/kg/min | INTRAVENOUS | Status: AC
  Administered 2015-09-26: .2 ug/kg/min via INTRAVENOUS
  Filled 2015-09-26: qty 2000

## 2015-09-26 MED ORDER — SIMVASTATIN 20 MG PO TABS: 20.0000 mg | ORAL_TABLET | Freq: Every day | ORAL | Status: DC

## 2015-09-26 MED ORDER — DIPHENHYDRAMINE HCL 12.5 MG/5ML PO ELIX: 12.5000 mg | ORAL_SOLUTION | Freq: Four times a day (QID) | ORAL | Status: DC | PRN

## 2015-09-26 MED ORDER — ASPIRIN EC 81 MG PO TBEC
81.0000 mg | DELAYED_RELEASE_TABLET | Freq: Every day | ORAL | Status: DC
  Administered 2015-09-27: 81 mg via ORAL
  Filled 2015-09-26: qty 1

## 2015-09-26 MED ORDER — SODIUM CHLORIDE 0.9 % IV SOLN: 500.0000 mL | Freq: Once | INTRAVENOUS | Status: DC | PRN

## 2015-09-26 MED ORDER — ESMOLOL HCL 100 MG/10ML IV SOLN
INTRAVENOUS | Status: DC | PRN
  Administered 2015-09-26 (×2): 50 mg via INTRAVENOUS

## 2015-09-26 MED ORDER — DIPHENHYDRAMINE HCL 25 MG PO TABS
12.5000 mg | ORAL_TABLET | Freq: Four times a day (QID) | ORAL | Status: DC | PRN
  Filled 2015-09-26: qty 0.5

## 2015-09-26 MED ORDER — FENTANYL CITRATE (PF) 100 MCG/2ML IJ SOLN
25.0000 ug | INTRAMUSCULAR | Status: DC | PRN
  Administered 2015-09-26 (×5): 25 ug via INTRAVENOUS

## 2015-09-26 MED ORDER — EPHEDRINE SULFATE 50 MG/ML IJ SOLN
INTRAMUSCULAR | Status: DC | PRN
  Administered 2015-09-26 (×2): 5 mg via INTRAVENOUS

## 2015-09-26 MED ORDER — NEOSTIGMINE METHYLSULFATE 10 MG/10ML IV SOLN
INTRAVENOUS | Status: DC | PRN
  Administered 2015-09-26: 4 mg via INTRAVENOUS

## 2015-09-26 MED ORDER — ONDANSETRON HCL 4 MG/2ML IJ SOLN
INTRAMUSCULAR | Status: DC | PRN
  Administered 2015-09-26: 4 mg via INTRAVENOUS

## 2015-09-26 MED ORDER — METOPROLOL TARTRATE 5 MG/5ML IV SOLN
2.0000 mg | INTRAVENOUS | Status: DC | PRN
Start: 2015-09-26 — End: 2015-09-27

## 2015-09-26 MED ORDER — ALUM & MAG HYDROXIDE-SIMETH 200-200-20 MG/5ML PO SUSP: 15.0000 mL | ORAL | Status: DC | PRN

## 2015-09-26 MED ORDER — PROPOFOL 10 MG/ML IV BOLUS
INTRAVENOUS | Status: AC
  Filled 2015-09-26: qty 40

## 2015-09-26 MED ORDER — ONDANSETRON HCL 4 MG/2ML IJ SOLN
4.0000 mg | Freq: Four times a day (QID) | INTRAMUSCULAR | Status: DC | PRN
  Administered 2015-09-26 – 2015-09-27 (×3): 4 mg via INTRAVENOUS
  Filled 2015-09-26 (×2): qty 2

## 2015-09-26 MED ORDER — NEBIVOLOL HCL 5 MG PO TABS
5.0000 mg | ORAL_TABLET | Freq: Every day | ORAL | Status: DC
  Administered 2015-09-27: 5 mg via ORAL
  Filled 2015-09-26: qty 1

## 2015-09-26 MED ORDER — ENOXAPARIN SODIUM 30 MG/0.3ML ~~LOC~~ SOLN: 30.0000 mg | SUBCUTANEOUS | Status: DC

## 2015-09-26 MED ORDER — PANTOPRAZOLE SODIUM 40 MG PO TBEC
40.0000 mg | DELAYED_RELEASE_TABLET | Freq: Every day | ORAL | Status: DC
  Administered 2015-09-27: 40 mg via ORAL
  Filled 2015-09-26: qty 1

## 2015-09-26 MED ORDER — ACETAMINOPHEN 160 MG/5ML PO SOLN
325.0000 mg | ORAL | Status: DC | PRN
  Filled 2015-09-26: qty 20.3

## 2015-09-26 MED ORDER — DOCUSATE SODIUM 100 MG PO CAPS
100.0000 mg | ORAL_CAPSULE | Freq: Every day | ORAL | Status: DC
  Administered 2015-09-27: 100 mg via ORAL
  Filled 2015-09-26: qty 1

## 2015-09-26 MED ORDER — AMLODIPINE BESYLATE 10 MG PO TABS
10.0000 mg | ORAL_TABLET | Freq: Every day | ORAL | Status: DC
  Filled 2015-09-26: qty 1

## 2015-09-26 MED ORDER — SUCCINYLCHOLINE CHLORIDE 200 MG/10ML IV SOSY
PREFILLED_SYRINGE | INTRAVENOUS | Status: AC
  Filled 2015-09-26: qty 10

## 2015-09-26 MED ORDER — VANCOMYCIN HCL IN DEXTROSE 1-5 GM/200ML-% IV SOLN
1000.0000 mg | INTRAVENOUS | Status: AC
Start: 2015-09-26 — End: 2015-09-26
  Administered 2015-09-26: 1000 mg via INTRAVENOUS
  Filled 2015-09-26: qty 200

## 2015-09-26 MED ORDER — PHENOL 1.4 % MT LIQD
1.0000 | OROMUCOSAL | Status: DC | PRN
Start: 2015-09-26 — End: 2015-09-27

## 2015-09-26 MED ORDER — IRBESARTAN 300 MG PO TABS: 300.0000 mg | ORAL_TABLET | Freq: Every day | ORAL | Status: DC

## 2015-09-26 MED ORDER — PHENYLEPHRINE HCL 10 MG/ML IJ SOLN
10.0000 mg | INTRAVENOUS | Status: DC | PRN
  Administered 2015-09-26: 20 ug/min via INTRAVENOUS

## 2015-09-26 MED ORDER — HYDRALAZINE HCL 20 MG/ML IJ SOLN: 5.0000 mg | INTRAMUSCULAR | Status: DC | PRN

## 2015-09-26 MED ORDER — 0.9 % SODIUM CHLORIDE (POUR BTL) OPTIME
TOPICAL | Status: DC | PRN
  Administered 2015-09-26: 2000 mL

## 2015-09-26 MED ORDER — IRBESARTAN 300 MG PO TABS
300.0000 mg | ORAL_TABLET | Freq: Every day | ORAL | Status: DC
Start: 2015-09-26 — End: 2015-09-27
  Administered 2015-09-27: 300 mg via ORAL
  Filled 2015-09-26 (×3): qty 1

## 2015-09-26 MED ORDER — CLOPIDOGREL BISULFATE 75 MG PO TABS: 75.0000 mg | ORAL_TABLET | Freq: Every day | ORAL | Status: DC

## 2015-09-26 MED ORDER — ENOXAPARIN SODIUM 40 MG/0.4ML ~~LOC~~ SOLN: 40.0000 mg | SUBCUTANEOUS | Status: DC

## 2015-09-26 MED ORDER — VITAMIN D3 25 MCG (1000 UNIT) PO TABS
1000.0000 [IU] | ORAL_TABLET | Freq: Every day | ORAL | Status: DC
  Administered 2015-09-27: 1000 [IU] via ORAL
  Filled 2015-09-26 (×2): qty 1

## 2015-09-26 MED ORDER — FENTANYL CITRATE (PF) 250 MCG/5ML IJ SOLN
INTRAMUSCULAR | Status: AC
  Filled 2015-09-26: qty 5

## 2015-09-26 MED ORDER — BISACODYL 10 MG RE SUPP: 10.0000 mg | Freq: Every day | RECTAL | Status: DC | PRN

## 2015-09-26 MED ORDER — LABETALOL HCL 5 MG/ML IV SOLN: 10.0000 mg | INTRAVENOUS | Status: DC | PRN

## 2015-09-26 SURGICAL SUPPLY — 44 items
CANISTER SUCTION 2500CC (MISCELLANEOUS) ×2 IMPLANT
CANNULA VESSEL 3MM 2 BLNT TIP (CANNULA) ×2 IMPLANT
CATH ROBINSON RED A/P 18FR (CATHETERS) ×2 IMPLANT
CLIP TI MEDIUM 6 (CLIP) ×2 IMPLANT
CLIP TI WIDE RED SMALL 6 (CLIP) ×2 IMPLANT
CRADLE DONUT ADULT HEAD (MISCELLANEOUS) ×2 IMPLANT
DECANTER SPIKE VIAL GLASS SM (MISCELLANEOUS) IMPLANT
DRAIN HEMOVAC 1/8 X 5 (WOUND CARE) IMPLANT
ELECT REM PT RETURN 9FT ADLT (ELECTROSURGICAL) ×2
ELECTRODE REM PT RTRN 9FT ADLT (ELECTROSURGICAL) ×1 IMPLANT
EVACUATOR SILICONE 100CC (DRAIN) IMPLANT
GAUZE SPONGE 4X4 12PLY STRL (GAUZE/BANDAGES/DRESSINGS) ×2 IMPLANT
GEL ULTRASOUND 20GR AQUASONIC (MISCELLANEOUS) IMPLANT
GLOVE BIO SURGEON STRL SZ 6.5 (GLOVE) ×2 IMPLANT
GLOVE BIO SURGEON STRL SZ7.5 (GLOVE) ×2 IMPLANT
GLOVE BIOGEL PI IND STRL 6.5 (GLOVE) ×2 IMPLANT
GLOVE BIOGEL PI INDICATOR 6.5 (GLOVE) ×2
GLOVE SURG SS PI 7.0 STRL IVOR (GLOVE) ×4 IMPLANT
GOWN SPEC L4 XLG W/TWL (GOWN DISPOSABLE) ×4 IMPLANT
GOWN STRL REUS W/ TWL LRG LVL3 (GOWN DISPOSABLE) ×3 IMPLANT
GOWN STRL REUS W/TWL LRG LVL3 (GOWN DISPOSABLE) ×3
KIT BASIN OR (CUSTOM PROCEDURE TRAY) ×2 IMPLANT
KIT ROOM TURNOVER OR (KITS) ×2 IMPLANT
LIQUID BAND (GAUZE/BANDAGES/DRESSINGS) ×2 IMPLANT
LOOP VESSEL MINI RED (MISCELLANEOUS) IMPLANT
NEEDLE HYPO 25GX1X1/2 BEV (NEEDLE) IMPLANT
NS IRRIG 1000ML POUR BTL (IV SOLUTION) ×4 IMPLANT
PACK CAROTID (CUSTOM PROCEDURE TRAY) ×2 IMPLANT
PAD ARMBOARD 7.5X6 YLW CONV (MISCELLANEOUS) ×4 IMPLANT
PATCH HEMASHIELD 8X75 (Vascular Products) ×2 IMPLANT
SHUNT CAROTID BYPASS 10 (VASCULAR PRODUCTS) ×2 IMPLANT
SHUNT CAROTID BYPASS 12FRX15.5 (VASCULAR PRODUCTS) IMPLANT
SPONGE INTESTINAL PEANUT (DISPOSABLE) ×2 IMPLANT
SPONGE SURGIFOAM ABS GEL 100 (HEMOSTASIS) IMPLANT
SUT ETHILON 3 0 PS 1 (SUTURE) IMPLANT
SUT PROLENE 6 0 CC (SUTURE) ×4 IMPLANT
SUT PROLENE 7 0 BV 1 (SUTURE) ×2 IMPLANT
SUT SILK 3 0 TIES 17X18 (SUTURE)
SUT SILK 3-0 18XBRD TIE BLK (SUTURE) IMPLANT
SUT VIC AB 3-0 SH 27 (SUTURE) ×1
SUT VIC AB 3-0 SH 27X BRD (SUTURE) ×1 IMPLANT
SUT VICRYL 4-0 PS2 18IN ABS (SUTURE) ×2 IMPLANT
SYR CONTROL 10ML LL (SYRINGE) IMPLANT
WATER STERILE IRR 1000ML POUR (IV SOLUTION) ×2 IMPLANT

## 2015-09-26 NOTE — Progress Notes (Signed)
  [facility] Day of Surgery Note  Subjective:  Patient seen in PACU. Feeling nauseous and incision hurts.   Filed Vitals:   09/26/15 1130 09/26/15 1140  BP:  165/90  Pulse: 122 102  Temp:    Resp: 18 19    Incisions: Right neck incision c/d/i. No hematoma.  Neuro:  5/5 strength upper and lower extremities. Smile symmetric. Tongue midline.    Assessment/Plan:  This is a 70 y.o. male who is s/p right carotid endarterectomy.  Neuro exam intact.  Neck without hematoma.  To 3S when bed available.    Virgina Jock, [place] Pager: [phone] 09/26/2015 12:14 PM

## 2015-09-26 NOTE — Progress Notes (Signed)
Pharmacy Antibiotic Note  Steven Scott is a 70 y.o. male admitted on 09/26/2015 with surgical prophylaxis.  Pharmacy has been consulted for vancomycin dosing.  Plan: Vancomycin 1250 mg IV x 1 dose 24 hours after pre-op dose  Height: 5\' 11"  (180.3 cm) Weight: 245 lb 2.4 oz (111.2 kg) IBW/kg (Calculated) : 75.3  Temp (24hrs), Avg:97.8 F (36.6 C), Min:97.5 F (36.4 C), Max:98.1 F (36.7 C)   Recent Labs Lab 09/26/15 1140  WBC 6.7    Estimated Creatinine Clearance: 39.4 mL/min (by C-G formula based on Cr of 2.18).    Allergies  Allergen Reactions  . Penicillins Rash    Has patient had a PCN reaction causing immediate rash, facial/tongue/throat swelling, SOB or lightheadedness with hypotension: No Has patient had a PCN reaction causing severe rash involving mucus membranes or skin necrosis: No Has patient had a PCN reaction that required hospitalization No Has patient had a PCN reaction occurring within the last 10 years: No If all of the above answers are "NO", then may proceed with Cephalosporin use.     Thank you for allowing [place] to participate in this patients care. Jens Som, PharmD Pager: [phone] 09/26/2015 5:50 PM

## 2015-09-26 NOTE — Op Note (Signed)
Procedure Right carotid endarterectomy  Preoperative diagnosis: High-grade asymptomatic right internal carotid artery stenosis  Postoperative diagnosis: Same  Anesthesia General  Asst.: Silva Bandy, [facility]  Operative findings: #1 greater than 80% right internal carotid stenosis                                                            #2 Dacron patch           #3 10 Fr shunt  Operative details: After obtaining informed consent, the patient was taken to the operating room. The patient was placed in a supine position on the operating room table. After induction of general anesthesia  the patient's entire neck and chest was prepped and draped in the usual sterile fashion. An oblique incision was made on the right aspect of the patient's neck anterior to the border the right sternocleidomastoid muscle. The incision was carried into the subcutaneous tissues and through the platysma. The sternocleidomastoid muscle was identified and reflected laterally. The omohyoid muscle was identified and this was divided with cautery. The common carotid artery was then found at the base of the incision this was dissected free circumferentially. It was fairly soft on palpation.  The vagus nerve was identified and protected. Dissection was then carried up to the level carotid bifurcation.   The hyperglossal nerve was well above the primary area of dissection but I did have to mobilized it some due to the depth of the ICA. The internal carotid artery was dissected free circumferentially just below the level of the hypoglossal nerve and it was soft in character at this location and above any palpable disease. A vessel loop was placed around this. The vessel was fairly small. Next the external carotid and superior thyroid arteries were dissected free circumferentially and vessel loops were placed around these. The patient was given 10000 units of intravenous heparin.  After 2 minutes of circulation time and raising the mean  arterial pressure to 90 mm mercury, the distal internal carotid artery was controlled with small bulldog clamp. The external carotid and superior thyroid arteries were controlled with vessel loops. The common carotid artery was controlled with a peripheral DeBakey clamp. A longitudinal opening was made in the common carotid artery just below the bifurcation. The arteriotomy was extended distally up into the internal carotid with Potts scissors. There was a large calcified plaque with greater than 80% stenosis in the internal carotid.  An 10 Fr was brought onto the field and fashioned to fit the patient's artery.  This was threaded into the distal internal carotid artery and allowed to backbleed thoroughly.  There was pulsatile backbleeding.  This was then threaded into the common carotid and secured with a Rummel tourniquet.   There was no air at this point and flow was restored to the brain.  Attention was then turned to the common carotid artery once again. A suitable endarterectomy plane was obtained and endarterectomy was begun in the common carotid artery and a good proximal endpoint was obtained. An eversion endarterectomy was performed on the external carotid artery and a good endpoint was obtained. The plaque was then elevated in the internal carotid artery and a nice feathered distal endpoint was also obtained.  The plaque was passed off the table. All loose debris was then removed from the  carotid bed and everything was thoroughly irrigated with heparinized saline. A Dacron patch was then brought on to the operative field and this was sewn on as a patch angioplasty using a running 6-0 Prolene suture. Prior to completion of the anastomosis the internal carotid artery was thoroughly backbled. This was then controlled again with a fine bulldog clamp.  The common carotid was thoroughly flushed forward. The external carotid was also thoroughly backbled.  The remainder of the patch was completed and the  anastomosis was secured. Flow was then restored first retrograde from the external carotid into the carotid bed then antegrade from the common carotid to the external carotid artery and after approximately 5 cardiac cycles to the internal carotid artery. Doppler was used to evaluate the external/internal and common carotid arteries and these all had good Doppler flow. Hemostasis was obtained with 1 additional repair suture. The patient was also given 100 mg of Protamine.      The platysma muscle was reapproximated using a running 3-0 Vicryl suture. The skin was closed with 4 0 Vicryl subcuticular stitch.  The patient was awakened in the operating room and was moving upper and lower extremities symmetrically and following commands.  The patient was stable on arrival to the PACU.  Ruta Hinds, MD [facility] [place] Office: [phone] Pager: [phone]

## 2015-09-26 NOTE — Progress Notes (Signed)
  Day of Surgery Note    Subjective:  C/o soreness at incision and dry mouth  Filed Vitals:   09/26/15 1322 09/26/15 1330  BP: 134/72   Pulse: 84 91  Temp:    Resp: 18 19    Incisions:   Clean and dry without hematoma Extremities:  Moving all extremities equally Cardiac:  regular Lungs:  Non labored Neuro:  In tact; tongue is midline  Assessment/Plan:  This is a 70 y.o. male who is s/p right carotid endarterectomy  -pt doing well in pacu and neuro is in tact -to [place] when bed available   Leontine Locket, PA-C 09/26/2015 2:14 PM

## 2015-09-26 NOTE — Interval H&P Note (Signed)
History and Physical Interval Note:  09/26/2015 8:24 AM  Steven Scott  has presented today for surgery, with the diagnosis of Right carotid artery stenosis I65.21  The various methods of treatment have been discussed with the patient and family. After consideration of risks, benefits and other options for treatment, the patient has consented to  Procedure(s): ENDARTERECTOMY CAROTID (Right) as a surgical intervention .  The patient's history has been reviewed, patient examined, no change in status, stable for surgery.  I have reviewed the patient's chart and labs.  Questions were answered to the patient's satisfaction.     Ruta Hinds

## 2015-09-26 NOTE — Anesthesia Postprocedure Evaluation (Signed)
Anesthesia Post Note  Patient: GLENIS MICHALAK  Procedure(s) Performed: Procedure(s) (LRB): RIGHT CAROTID ARTERY ENDARTERECTOMY  (Right) WITH HEMASHIELD PATCH ANGIOPLASTY (Right)  Patient location during evaluation: PACU Anesthesia Type: General Level of consciousness: awake Pain management: pain level controlled Vital Signs Assessment: post-procedure vital signs reviewed and stable Respiratory status: spontaneous breathing Cardiovascular status: stable Postop Assessment: no signs of nausea or vomiting Anesthetic complications: no    Last Vitals:  Filed Vitals:   09/26/15 1645 09/26/15 1652  BP:  153/96  Pulse:  108  Temp: 36.4 C   Resp:  23    Last Pain:  Filed Vitals:   09/26/15 1656  PainSc: 4                  Christianne Zacher

## 2015-09-26 NOTE — Anesthesia Preprocedure Evaluation (Addendum)
Anesthesia Evaluation  Patient identified by MRN, date of birth, ID band Patient awake    Reviewed: Allergy & Precautions, NPO status , Patient's Chart, lab work & pertinent test results, reviewed documented beta blocker date and time   History of Anesthesia Complications Negative for: history of anesthetic complications  Airway Mallampati: IV  TM Distance: >3 FB Neck ROM: Full    Dental  (+) Missing, Edentulous Upper, Partial Lower, Dental Advisory Given   Pulmonary shortness of breath, neg sleep apnea, neg COPD, neg recent URI, former smoker,    breath sounds clear to auscultation       Cardiovascular hypertension, Pt. on medications and Pt. on home beta blockers + CAD, + Cardiac Stents and + Peripheral Vascular Disease  (-) CHF + dysrhythmias Atrial Fibrillation (-) Valvular Problems/Murmurs Rhythm:Irregular     Neuro/Psych  Headaches, neg Seizures negative psych ROS   GI/Hepatic GERD  Medicated and Controlled,  Endo/Other  Morbid obesity  Renal/GU CRFRenal disease     Musculoskeletal  (+) Arthritis ,   Abdominal   Peds  Hematology negative hematology ROS (+)   Anesthesia Other Findings   Reproductive/Obstetrics                            Anesthesia Physical Anesthesia Plan  ASA: III  Anesthesia Plan: General   Post-op Pain Management:    Induction: Intravenous  Airway Management Planned: Oral ETT  Additional Equipment: Arterial line  Intra-op Plan:   Post-operative Plan: Extubation in OR  Informed Consent: I have reviewed the patients History and Physical, chart, labs and discussed the procedure including the risks, benefits and alternatives for the proposed anesthesia with the patient or authorized representative who has indicated his/her understanding and acceptance.   Dental advisory given  Plan Discussed with: CRNA and Surgeon  Anesthesia Plan Comments:          Anesthesia Quick Evaluation

## 2015-09-26 NOTE — H&P (View-Only) (Signed)
Referring Physician: Caren Macadam, MD  Patient name: Steven Scott MRN: [id number] DOB: 1945-08-13 Sex: male  REASON FOR CONSULT: Asymptomatic right internal carotid artery stenosis  HPI: Steven Scott is a 70 y.o. male who has been followed by Dr. Gwenlyn Found for an asymptomatic right internal carotid artery stenosis. This has now progressed to greater than 80%. He denies any prior history of TIA amaurosis or stroke. He is currently on aspirin and Plavix. He is also on a statin. Other medical problems include coronary artery disease, hypertension, renal artery stenosis  (previous angioplasty), renal dysfunction followed by Dr Hinda Lenis.  These have all been stable. The patient does have a history of peptic ulcer disease and did have a bleeding episode several months ago but this was treated with medication and he did not require transfusion.  Past Medical History  Diagnosis Date  . Coronary artery disease   . Hypertension   . Renal artery stenosis (HCC)     Multiple interventions  . Status post percutaneous angioplasty of renal artery 10/18 2012    PV angiogram preformed revealing 90% right and 70% left in stent restenosis with 57mm and 58mm gradients respectively.  . Hyperlipidemia   . Shortness of breath     "at any time" (12/13/2012)  . Carotid artery occlusion    Past Surgical History  Procedure Laterality Date  . Lithotripsy      "laser" (12/13/2012)  . Colonoscopy  12/18/2010    Procedure: COLONOSCOPY;  Surgeon: Rogene Houston, MD;  [facility];  Service: Endoscopy;  Laterality: N/A;  . Coronary angioplasty with stent placement  03/28/2009    Successful PCI od high-grade 95% eccentric tandem plague stenosis in the distal right coronary artery with ultimate insertion of a 3.0 x 18 mm Xience V DES stent postdilated at 3.46 mm with the 99% stenosis being reduced to 0%  . Cardiac catheterization  03/02/2009    has moderate circumflex and diagonal branch disease with  high-grade distal right disease and fail attempt at PCI  . Renal artery stent Bilateral 2010  . Renal artery angioplasty Bilateral 02/2014    PV restenting L RA with an ICast covered stent and cutting balloon atherectomy R RA  . Renal angiogram N/A 12/14/2012    Procedure: RENAL ANGIOGRAM;  Surgeon: [name], MD;  Location: [facility] CATH LAB;  Service: Cardiovascular;  Laterality: N/A;  . Percutaneous stent intervention Left 12/14/2012    Procedure: PERCUTANEOUS STENT INTERVENTION;  Surgeon: [name], MD;  Location: [facility] CATH LAB;  Service: Cardiovascular;  Laterality: Left;  left renal artery  . Renal angiogram N/A 02/16/2014    Procedure: RENAL ANGIOGRAM;  Surgeon: [name], MD;  Location: [facility] CATH LAB;  Service: Cardiovascular;  Laterality: N/A;  . Percutaneous stent intervention  02/16/2014    Procedure: PERCUTANEOUS STENT INTERVENTION;  Surgeon: [name], MD;  Location: [facility] CATH LAB;  Service: Cardiovascular;;  left renal  . Esophagogastroduodenoscopy N/A 11/08/2014    Procedure: ESOPHAGOGASTRODUODENOSCOPY (EGD);  Surgeon: Rogene Houston, MD;  [facility];  Service: Endoscopy;  Laterality: N/A;  . Esophagogastroduodenoscopy N/A 03/15/2015    Procedure: ESOPHAGOGASTRODUODENOSCOPY (EGD);  Surgeon: Rogene Houston, MD;  [facility]: [facility];  Service: Endoscopy;  Laterality: N/A;  1255    Family History  Problem Relation Age of Onset  . COPD Father 52    deceased    SOCIAL HISTORY: Social History   Social History  . Marital  Status: Married    Spouse Name: N/A  . Number of Children: N/A  . Years of Education: N/A   Occupational History  . Not on file.   Social History Main Topics  . Smoking status: Former Smoker -- 2.00 packs/day for 50 years    Types: Cigarettes    Quit date: 02/24/2011  . Smokeless tobacco: Never Used  . Alcohol Use: No     Comment: 12/13/2012 "last alcohol was ~ 4 yr ago"  . Drug Use: No  . Sexual Activity: Not Currently    Other Topics Concern  . Not on file   Social History Narrative    Allergies  Allergen Reactions  . Penicillins Rash    Current Outpatient Prescriptions  Medication Sig Dispense Refill  . acetaminophen (TYLENOL) 500 MG tablet Take 500 mg by mouth every 6 (six) hours as needed for moderate pain.    Marland Kitchen aspirin EC 81 MG tablet Take 1 tablet (81 mg total) by mouth daily. Start on 11/15/14 30 tablet 1  . BYSTOLIC 5 MG tablet TAKE 1 TABLET BY MOUTH DAILY WITH BREAKFAST. 30 tablet 8  . cholecalciferol (VITAMIN D) 1000 UNITS tablet Take 1,000 Units by mouth daily.    . clobetasol cream (TEMOVATE) [id number] % Apply 1 application topically daily as needed (Psoriasis).    . clopidogrel (PLAVIX) 75 MG tablet TAKE 1 TABLET BY MOUTH AT BEDTIME. 30 tablet 8  . Olmesartan-Amlodipine-HCTZ 40-10-25 MG TABS TAKE 1 TABLET BY MOUTH DAILY WITH BREAKFAST. 30 tablet 4  . pantoprazole (PROTONIX) 40 MG tablet TAKE ONE TABLET BY MOUTH ONCE DAILY. 30 tablet 0  . simvastatin (ZOCOR) 20 MG tablet TAKE (1) TABLET BY MOUTH AT BEDTIME. 30 tablet 8   No current facility-administered medications for this visit.    ROS:   General:  No weight loss, Fever, chills  HEENT: No recent headaches, no nasal bleeding, no visual changes, no sore throat  Neurologic: No dizziness, blackouts, seizures. No recent symptoms of stroke or mini- stroke. No recent episodes of slurred speech, or temporary blindness.  Cardiac: No recent episodes of chest pain/pressure, no shortness of breath at rest.  No shortness of breath with exertion.  Denies history of atrial fibrillation or irregular heartbeat  Vascular: No history of rest pain in feet.  No history of claudication.  No history of non-healing ulcer, No history of DVT   Pulmonary: No home oxygen, no productive cough, no hemoptysis,  No asthma or wheezing  Musculoskeletal:  [ ]  Arthritis, [ ]  Low back pain,  [ ]  Joint pain  Hematologic:No history of hypercoagulable state.  No history  of easy bleeding.  No history of anemia  Gastrointestinal: No hematochezia or melena,  No gastroesophageal reflux, no trouble swallowing  Urinary: [ ]  chronic Kidney disease, [ ]  on HD - [ ]  MWF or [ ]  TTHS, [ ]  Burning with urination, [ ]  Frequent urination, [ ]  Difficulty urinating;   Skin: No rashes  Psychological: No history of anxiety,  No history of depression   Physical Examination  Filed Vitals:   09/06/15 1341 09/06/15 1347 09/06/15 1348  BP: 149/78 145/78 144/78  Pulse: 79 79 79  Temp: 97.1 F (36.2 C)    Resp: 16    Height: 5\' 11"  (1.803 m)    Weight: 232 lb (105.235 kg)    SpO2: 99%      Body mass index is 32.37 kg/(m^2).  General:  Alert and oriented, no acute distress HEENT: Normal Neck: No bruit  or JVD Pulmonary: Clear to auscultation bilaterally Cardiac: Regular Rate and Rhythm without murmur Abdomen: Soft, non-tender, non-distended, no mass, obese  Skin: No rash Extremity Pulses:  2+ radial, brachial, femoral, absent dorsalis pedis, posterior tibial pulses bilaterally Musculoskeletal: No deformity or edema  Neurologic: Upper and lower extremity motor 5/5 and symmetric  DATA:  Limited carotid duplex was performed today on the right side. This showed velocities in the right side 466/141 with normal carotid artery distal to the plaque. This is compared to previous carotid duplex performed at the [facility] vascular lab which showed a velocity on the right side 424/159, left side was 40-60% vertebral arteries were antegrade bilaterally  ASSESSMENT:  Asymptomatic high-grade right internal carotid artery stenosis   PLAN:  Patient will be scheduled for right carotid endarterectomy 09/25/2015 pending his cardiac workup which is scheduled to be completed on May 11. Risks benefits possible complications and procedure details including not limited to stroke risk bleeding infection cranial nerve injury myocardial events were discussed with the patient today. He understands  and agrees to proceed. We will continue his aspirin perioperatively. We will stop his Plavix 7 days prior to the procedure and resume this postoperatively.   Ruta Hinds, MD [facility] [place] Office: [phone] Pager: [phone]

## 2015-09-26 NOTE — Anesthesia Procedure Notes (Signed)
Procedure Name: Intubation Date/Time: 09/26/2015 8:45 AM Performed by: Williemae Area B Pre-anesthesia Checklist: Patient identified, Emergency Drugs available, Suction available and Patient being monitored Patient Re-evaluated:Patient Re-evaluated prior to inductionOxygen Delivery Method: Circle system utilized Preoxygenation: Pre-oxygenation with 100% oxygen Intubation Type: IV induction Ventilation: Mask ventilation without difficulty Laryngoscope Size: Mac and 4 Grade View: Grade I Tube type: Oral Tube size: 7.5 mm Number of attempts: 1 Airway Equipment and Method: Stylet Placement Confirmation: ETT inserted through vocal cords under direct vision,  positive ETCO2 and breath sounds checked- equal and bilateral Secured at: 22 (cm at upper gum) cm Tube secured with: Tape Dental Injury: Teeth and Oropharynx as per pre-operative assessment

## 2015-09-26 NOTE — Transfer of Care (Signed)
Immediate Anesthesia Transfer of Care Note  Patient: Hurman Horn  Procedure(s) Performed: Procedure(s): RIGHT CAROTID ARTERY ENDARTERECTOMY  (Right) WITH HEMASHIELD PATCH ANGIOPLASTY (Right)  Patient Location: PACU  Anesthesia Type:General  Level of Consciousness: awake, alert  and patient cooperative  Airway & Oxygen Therapy: Patient Spontanous Breathing and Patient connected to face mask oxygen  Post-op Assessment: Report given to RN and Patient moving all extremities  Post vital signs: Reviewed and stable  Last Vitals:  Filed Vitals:   09/26/15 0622 09/26/15 1123  BP: 154/59 166/106  Pulse: 76 105  Temp: 36.6 C 36.7 C  Resp: 20 9    Last Pain:  Filed Vitals:   09/26/15 1127  PainSc: 0-No pain      Patients Stated Pain Goal: 2 ([id number])  Complications: No apparent anesthesia complications

## 2015-09-27 ENCOUNTER — Encounter (HOSPITAL_COMMUNITY): Payer: Self-pay | Admitting: Vascular Surgery

## 2015-09-27 ENCOUNTER — Telehealth: Payer: Self-pay

## 2015-09-27 LAB — BASIC METABOLIC PANEL
Anion gap: 12 (ref 5–15)
BUN: 21 mg/dL — AB (ref 6–20)
CHLORIDE: 104 mmol/L (ref 101–111)
CO2: 24 mmol/L (ref 22–32)
Calcium: 8.7 mg/dL — ABNORMAL LOW (ref 8.9–10.3)
Creatinine, Ser: 1.9 mg/dL — ABNORMAL HIGH (ref 0.61–1.24)
GFR calc non Af Amer: 34 mL/min — ABNORMAL LOW (ref >60)
GFR, EST AFRICAN AMERICAN: 39 mL/min — AB (ref >60)
Glucose, Bld: 125 mg/dL — ABNORMAL HIGH (ref 65–99)
POTASSIUM: 3.9 mmol/L (ref 3.5–5.1)
SODIUM: 140 mmol/L (ref 135–145)

## 2015-09-27 LAB — CREATININE, SERUM
CREATININE: 1.94 mg/dL — AB (ref 0.61–1.24)
GFR calc Af Amer: 38 mL/min — ABNORMAL LOW (ref >60)
GFR, EST NON AFRICAN AMERICAN: 33 mL/min — AB (ref >60)

## 2015-09-27 LAB — CBC
HEMATOCRIT: 41.8 % (ref 39.0–52.0)
HEMOGLOBIN: 13.5 g/dL (ref 13.0–17.0)
MCH: 27.6 pg (ref 26.0–34.0)
MCHC: 32.3 g/dL (ref 30.0–36.0)
MCV: 85.3 fL (ref 78.0–100.0)
Platelets: 167 10*3/uL (ref 150–400)
RBC: 4.9 MIL/uL (ref 4.22–5.81)
RDW: 14.3 % (ref 11.5–15.5)
WBC: 11.6 10*3/uL — ABNORMAL HIGH (ref 4.0–10.5)

## 2015-09-27 NOTE — Telephone Encounter (Signed)
Left message to call back  

## 2015-09-27 NOTE — Telephone Encounter (Signed)
-----   Message from [name], MD sent at 09/26/2015  3:20 PM EDT ----- Essentially normal study. Repeat when clinically indicated.

## 2015-09-27 NOTE — Care Management Note (Signed)
Case Management Note  Patient Details  Name: Steven Scott MRN: [id number] Date of Birth: 03/18/1946  Subjective/Objective:    Patient is from home with wife, s/p right carotid endarterectomy, patient is a little nauseous, plan for dc later today.  NCM will cont to follow for dc needs.                 Action/Plan:   Expected Discharge Date:                  Expected Discharge Plan:  Home/Self Care  In-House Referral:     Discharge planning Services  CM Consult  Post Acute Care Choice:    Choice offered to:     DME Arranged:    DME Agency:     HH Arranged:    [place] Agency:     Status of Service:  Completed, signed off  Medicare Important Message Given:    Date Medicare IM Given:    Medicare IM give by:    Date Additional Medicare IM Given:    Additional Medicare Important Message give by:     If discussed at [place] of Stay Meetings, dates discussed:    Additional Comments:  Zenon Mayo, RN 09/27/2015, 10:39 AM

## 2015-09-27 NOTE — Progress Notes (Signed)
[facility] [place]  Subjective  - Nausea over night.  Tylenol for pain.   Objective 160/76 82 97.8 F (36.6 C) (Oral) 21 95%  Intake/Output Summary (Last 24 hours) at 09/27/15 [id number] Last data filed at 09/27/15 [id number]  Gross per 24 hour  Intake   2390 ml  Output   1650 ml  Net    740 ml    Right neck incision clean and dry Grip 5/5 No tongue deviation, smile symmetric Heart RRR Lungs non labored breathing  Assessment/Planning: POD #1 Right CEA  Pending tolerating breakfast he will be discharged home today He has ambulated and voided.  Laurence Slate [facility] 09/27/2015 7:12 AM --  Laboratory Lab Results:  Recent Labs  09/26/15 1140 09/27/15 0325  WBC 6.7 11.6*  HGB 11.0* 13.5  HCT 33.4* 41.8  PLT 137* 167   BMET  Recent Labs  09/26/15 2320 09/27/15 0325  NA  --  140  K  --  3.9  CL  --  104  CO2  --  24  GLUCOSE  --  125*  BUN  --  21*  CREATININE 1.94* 1.90*  CALCIUM  --  8.7*    COAG Lab Results  Component Value Date   INR 1.24 09/19/2015   INR 1.26 11/08/2014   INR 1.17 11/07/2014   No results found for: PTT

## 2015-09-27 NOTE — Progress Notes (Signed)
Patient vomited once early this AM. Was able to keep down breakfast and daily meds. Patient states he feels better. Discussed discharge instructions and medications with patient and patients daughters. All verbalized understanding with all questions answered. VSS. Pt discharged home with daughter.  [facility]

## 2015-09-27 NOTE — Progress Notes (Signed)
Patient ambulated approximately 377ft around the unit on room air with no aide device.  VSS, HR elevated to 120s but returned to WNL with rest.  Pt now sitting in chair.  Will continue to monitor.

## 2015-10-01 ENCOUNTER — Encounter: Payer: Self-pay | Admitting: Vascular Surgery

## 2015-10-01 NOTE — Discharge Summary (Signed)
[facility] Discharge Summary   Patient ID:  Steven Scott MRN: [id number] DOB/AGE: August 27, 1945 70 y.o.  Admit date: 09/26/2015 5/18/2017Discharge date:  Date of Surgery: 09/26/2015 Surgeon: Surgeon(s): Elam Dutch, MD  Admission Diagnosis: Right carotid artery stenosis I65.21  Discharge Diagnoses:  Right carotid artery stenosis I65.21  Secondary Diagnoses: Past Medical History  Diagnosis Date  . Coronary artery disease   . Hypertension   . Renal artery stenosis (HCC)     Multiple interventions  . Status post percutaneous angioplasty of renal artery 10/18 2012    PV angiogram preformed revealing 90% right and 70% left in stent restenosis with 6mm and 25mm gradients respectively.  . Hyperlipidemia   . Shortness of breath     "at any time" (12/13/2012)  . Carotid artery occlusion   . Seasonal allergies   . GERD (gastroesophageal reflux disease)   . Headache   . Arthritis   . History of bleeding ulcers   . Atrial fibrillation ([place]) 03/2015    Procedure(s): RIGHT CAROTID ARTERY ENDARTERECTOMY  WITH HEMASHIELD PATCH ANGIOPLASTY  Discharged Condition: good  HPI:  Steven Scott is a 70 y.o. male who has been followed by Dr. Gwenlyn Found for an asymptomatic right internal carotid artery stenosis. This has now progressed to greater than 80%. He denies any prior history of TIA amaurosis or stroke. He is currently on aspirin and Plavix. He is also on a statin. Other medical problems include coronary artery disease, hypertension, renal artery stenosis (previous angioplasty), renal dysfunction followed by Dr Hinda Lenis. These have all been stable. The patient does have a history of peptic ulcer disease and did have a bleeding episode several months ago but this was treated with medication and he did not require transfusion.   Hospital Course:  [name] is a 70 y.o. male is S/P  Procedure(s): RIGHT CAROTID ARTERY ENDARTERECTOMY  WITH HEMASHIELD PATCH  ANGIOPLASTY Right neck incision clean and dry Grip 5/5 No tongue deviation, smile symmetric Heart RRR Lungs non labored breathing  Assessment/Planning: POD #1 Right CEA  Pending tolerating breakfast he will be discharged home today He has ambulated and voided. Nausea over night. Tylenol for pain. Tolerated breakfast no further nausea.  Disposition stable discharge home.   Significant Diagnostic Studies: CBC Lab Results  Component Value Date   WBC 11.6* 09/27/2015   HGB 13.5 09/27/2015   HCT 41.8 09/27/2015   MCV 85.3 09/27/2015   PLT 167 09/27/2015    BMET    Component Value Date/Time   NA 140 09/27/2015 0325   K 3.9 09/27/2015 0325   CL 104 09/27/2015 0325   CO2 24 09/27/2015 0325   GLUCOSE 125* 09/27/2015 0325   BUN 21* 09/27/2015 0325   CREATININE 1.90* 09/27/2015 0325   CREATININE 1.80* 02/13/2014 1110   CALCIUM 8.7* 09/27/2015 0325   GFRNONAA 34* 09/27/2015 0325   GFRAA 39* 09/27/2015 0325   COAG Lab Results  Component Value Date   INR 1.24 09/19/2015   INR 1.26 11/08/2014   INR 1.17 11/07/2014     Disposition:  Discharge to :Home Discharge Instructions    CAROTID [place]: Call MD for difficulty swallowing or speaking; weakness in arms or legs that is a new symtom; severe headache.  If you have increased swelling in the neck and/or  are having difficulty breathing, CALL 911    Complete by:  As directed      Call MD for:  redness, tenderness, or signs of infection (pain, swelling, bleeding,  redness, odor or green/yellow discharge around incision site)    Complete by:  As directed      Call MD for:  severe or increased pain, loss or decreased feeling  in affected limb(s)    Complete by:  As directed      Call MD for:  temperature >100.5    Complete by:  As directed      Discharge patient    Complete by:  As directed   Discharge pt to home     Discharge wound care:    Complete by:  As directed   Wash wound daily with soap and water and pat dry. Do  not apply any creams or ointments on your incisions.     Driving Restrictions    Complete by:  As directed   No driving for 1 week     Increase activity slowly    Complete by:  As directed   Walk with assistance use walker or cane as needed     Lifting restrictions    Complete by:  As directed   No lifting for 1 week     Resume previous diet    Complete by:  As directed             Medication List    TAKE these medications        acetaminophen 500 MG tablet  Commonly known as:  TYLENOL  Take 500 mg by mouth every 6 (six) hours as needed for moderate pain.     aspirin EC 81 MG tablet  Take 1 tablet (81 mg total) by mouth daily. Start on [id number]     BYSTOLIC 5 MG tablet  Generic drug:  nebivolol  TAKE 1 TABLET BY MOUTH DAILY WITH BREAKFAST.     cholecalciferol 1000 units tablet  Commonly known as:  VITAMIN D  Take 1,000 Units by mouth daily.     clobetasol cream 0.05 %  Commonly known as:  TEMOVATE  Apply 1 application topically daily as needed (Psoriasis).     clopidogrel 75 MG tablet  Commonly known as:  PLAVIX  TAKE 1 TABLET BY MOUTH AT BEDTIME.     diphenhydrAMINE 25 MG tablet  Commonly known as:  BENADRYL  Take 12.5 mg by mouth every 6 (six) hours as needed for allergies.     Olmesartan-Amlodipine-HCTZ 40-10-25 MG Tabs  TAKE 1 TABLET BY MOUTH DAILY WITH BREAKFAST.     oxyCODONE-acetaminophen 5-325 MG tablet  Commonly known as:  PERCOCET/ROXICET  Take 1-2 tablets by mouth every 6 (six) hours as needed for moderate pain.     pantoprazole 40 MG tablet  Commonly known as:  PROTONIX  TAKE ONE TABLET BY MOUTH ONCE DAILY.     simvastatin 20 MG tablet  Commonly known as:  ZOCOR  TAKE (1) TABLET BY MOUTH AT BEDTIME.       Verbal and written Discharge instructions given to the patient. Wound care per Discharge AVS     Follow-up Information    Follow up with Ruta Hinds, MD In 2 weeks.   Specialties:  Vascular Surgery, Cardiology   Why:  Our office  will call you to arrange an appointment (sent)   Contact information:   [place] [address] [phone]       Signed: Laurence Slate [facility] 10/01/2015, 1:28 PM  --- For [facility] Registry use --- Instructions: Press F2 to tab through selections.  Delete question if not applicable.   Modified Rankin score at D/C (  0-6): Rankin Score=0  IV medication needed for:  1. Hypertension: No 2. Hypotension: No  Post-op Complications: No  1. Post-op CVA or TIA: No  If yes: Event classification (right eye, left eye, right cortical, left cortical, verterobasilar, other):   If yes: Timing of event (intra-op, <6 hrs post-op, >=6 hrs post-op, unknown):   2. CN injury: No  If yes: CN  injuried   3. Myocardial infarction: No  If yes: Dx by (EKG or clinical, Troponin):   4.  CHF: No  5.  Dysrhythmia (new): No  6. Wound infection: No  7. Reperfusion symptoms: No  8. Return to OR: No  If yes: return to OR for (bleeding, neurologic, other CEA incision, other):   Discharge medications: Statin use:  Yes ASA use:  Yes Beta blocker use:  yes ACE-Inhibitor use:  No  for medical reason   P2Y12 Antagonist use: [ ]  None, [x ] Plavix, [ ]  Plasugrel, [ ]  Ticlopinine, [ ]  Ticagrelor, [ ]  Other, [ ]  No for medical reason, [ ]  Non-compliant, [ ]  Not-indicated Anti-coagulant use:  [x ] None, [ ]  Warfarin, [ ]  Rivaroxaban, [ ]  Dabigatran, [ ]  Other, [ ]  No for medical reason, [ ]  Non-compliant, [ ]  Not-indicated

## 2015-10-01 NOTE — Telephone Encounter (Signed)
Steven Scott is calling back to get his test results . Please call   Thanks

## 2015-10-01 NOTE — Telephone Encounter (Signed)
Re: echo results Returned call, advised results normal. Pt verbalized understanding & thanks.

## 2015-10-04 ENCOUNTER — Encounter: Payer: Self-pay | Admitting: Vascular Surgery

## 2015-10-04 ENCOUNTER — Ambulatory Visit (INDEPENDENT_AMBULATORY_CARE_PROVIDER_SITE_OTHER): Payer: Medicare Other | Admitting: Vascular Surgery

## 2015-10-04 VITALS — BP 140/82 | HR 85 | Temp 97.8°F | Ht 71.0 in | Wt 238.0 lb

## 2015-10-04 DIAGNOSIS — I6521 Occlusion and stenosis of right carotid artery: Secondary | ICD-10-CM

## 2015-10-04 MED ORDER — TAMSULOSIN HCL 0.4 MG PO CAPS: 0.4000 mg | ORAL_CAPSULE | Freq: Every day | ORAL | Status: DC

## 2015-10-04 NOTE — Progress Notes (Signed)
Patient is a 70 year old male who returns for follow-up today. He recently underwent right carotid endarterectomy. He is currently on aspirin and Plavix. He denies any headaches or any TIA or stroke or amaurosis symptoms. He has had some swelling in the right side of his neck. He has no swallowing difficulties. He has complained of what sounds like some urinary retention-type symptoms. He has a history of BPH as well as some mild renal dysfunction. He has a known 40-60% stenosis on the contralateral side.  Physical exam:  Filed Vitals:   10/04/15 1316 10/04/15 1317  BP: 144/84 140/82  Pulse: 85   Temp: 97.8 F (36.6 C)   TempSrc: Oral   Height: 5\' 11"  (1.803 m)   Weight: 238 lb (107.956 kg)   SpO2: 98%     Neck: Small hematoma right neck 3 x 3 cm poorly defined incision healing well no erythema no drainage  Neuro: Symmetric upper and lower extremity motor strength 5 over 5 tongue midline  Assessment: Doing well post carotid endarterectomy. Small right neck hematoma should resolve over the next few weeks. Patient will continue his aspirin and Plavix and statin. I did give him a prescription today for Flomax with a 30 day course to improve his overall urinary symptoms. He knows to follow up with his urologist if he has continued urinary obstruction symptoms. He does have follow-up scheduled with Dr. Hinda Lenis his nephrologist on June 6 to make sure that he has had no deterioration of renal function.  Plan: See above the patient will follow-up with me in 6 months time for repeat carotid duplex exam  Ruta Hinds, MD [facility] [place] Office: [phone] Pager: [phone]

## 2015-10-11 DIAGNOSIS — D509 Iron deficiency anemia, unspecified: Secondary | ICD-10-CM | POA: Diagnosis not present

## 2015-10-11 DIAGNOSIS — Z79899 Other long term (current) drug therapy: Secondary | ICD-10-CM | POA: Diagnosis not present

## 2015-10-11 DIAGNOSIS — R809 Proteinuria, unspecified: Secondary | ICD-10-CM | POA: Diagnosis not present

## 2015-10-11 DIAGNOSIS — I129 Hypertensive chronic kidney disease with stage 1 through stage 4 chronic kidney disease, or unspecified chronic kidney disease: Secondary | ICD-10-CM | POA: Diagnosis not present

## 2015-10-11 DIAGNOSIS — N183 Chronic kidney disease, stage 3 (moderate): Secondary | ICD-10-CM | POA: Diagnosis not present

## 2015-10-11 DIAGNOSIS — E559 Vitamin D deficiency, unspecified: Secondary | ICD-10-CM | POA: Diagnosis not present

## 2015-10-15 ENCOUNTER — Other Ambulatory Visit (INDEPENDENT_AMBULATORY_CARE_PROVIDER_SITE_OTHER): Payer: Self-pay | Admitting: Internal Medicine

## 2015-10-16 DIAGNOSIS — R809 Proteinuria, unspecified: Secondary | ICD-10-CM | POA: Diagnosis not present

## 2015-10-16 DIAGNOSIS — I10 Essential (primary) hypertension: Secondary | ICD-10-CM | POA: Diagnosis not present

## 2015-10-16 DIAGNOSIS — N183 Chronic kidney disease, stage 3 (moderate): Secondary | ICD-10-CM | POA: Diagnosis not present

## 2015-10-16 DIAGNOSIS — D509 Iron deficiency anemia, unspecified: Secondary | ICD-10-CM | POA: Diagnosis not present

## 2015-12-16 IMAGING — CR DG CHEST 2V
2 series · 2 of 2 positions shown · non-contrast
Comparison: 12/09/2012

CLINICAL DATA: Preop  for renal artery stenosis stent

EXAM:
CHEST  2 VIEW

[w chest pa]
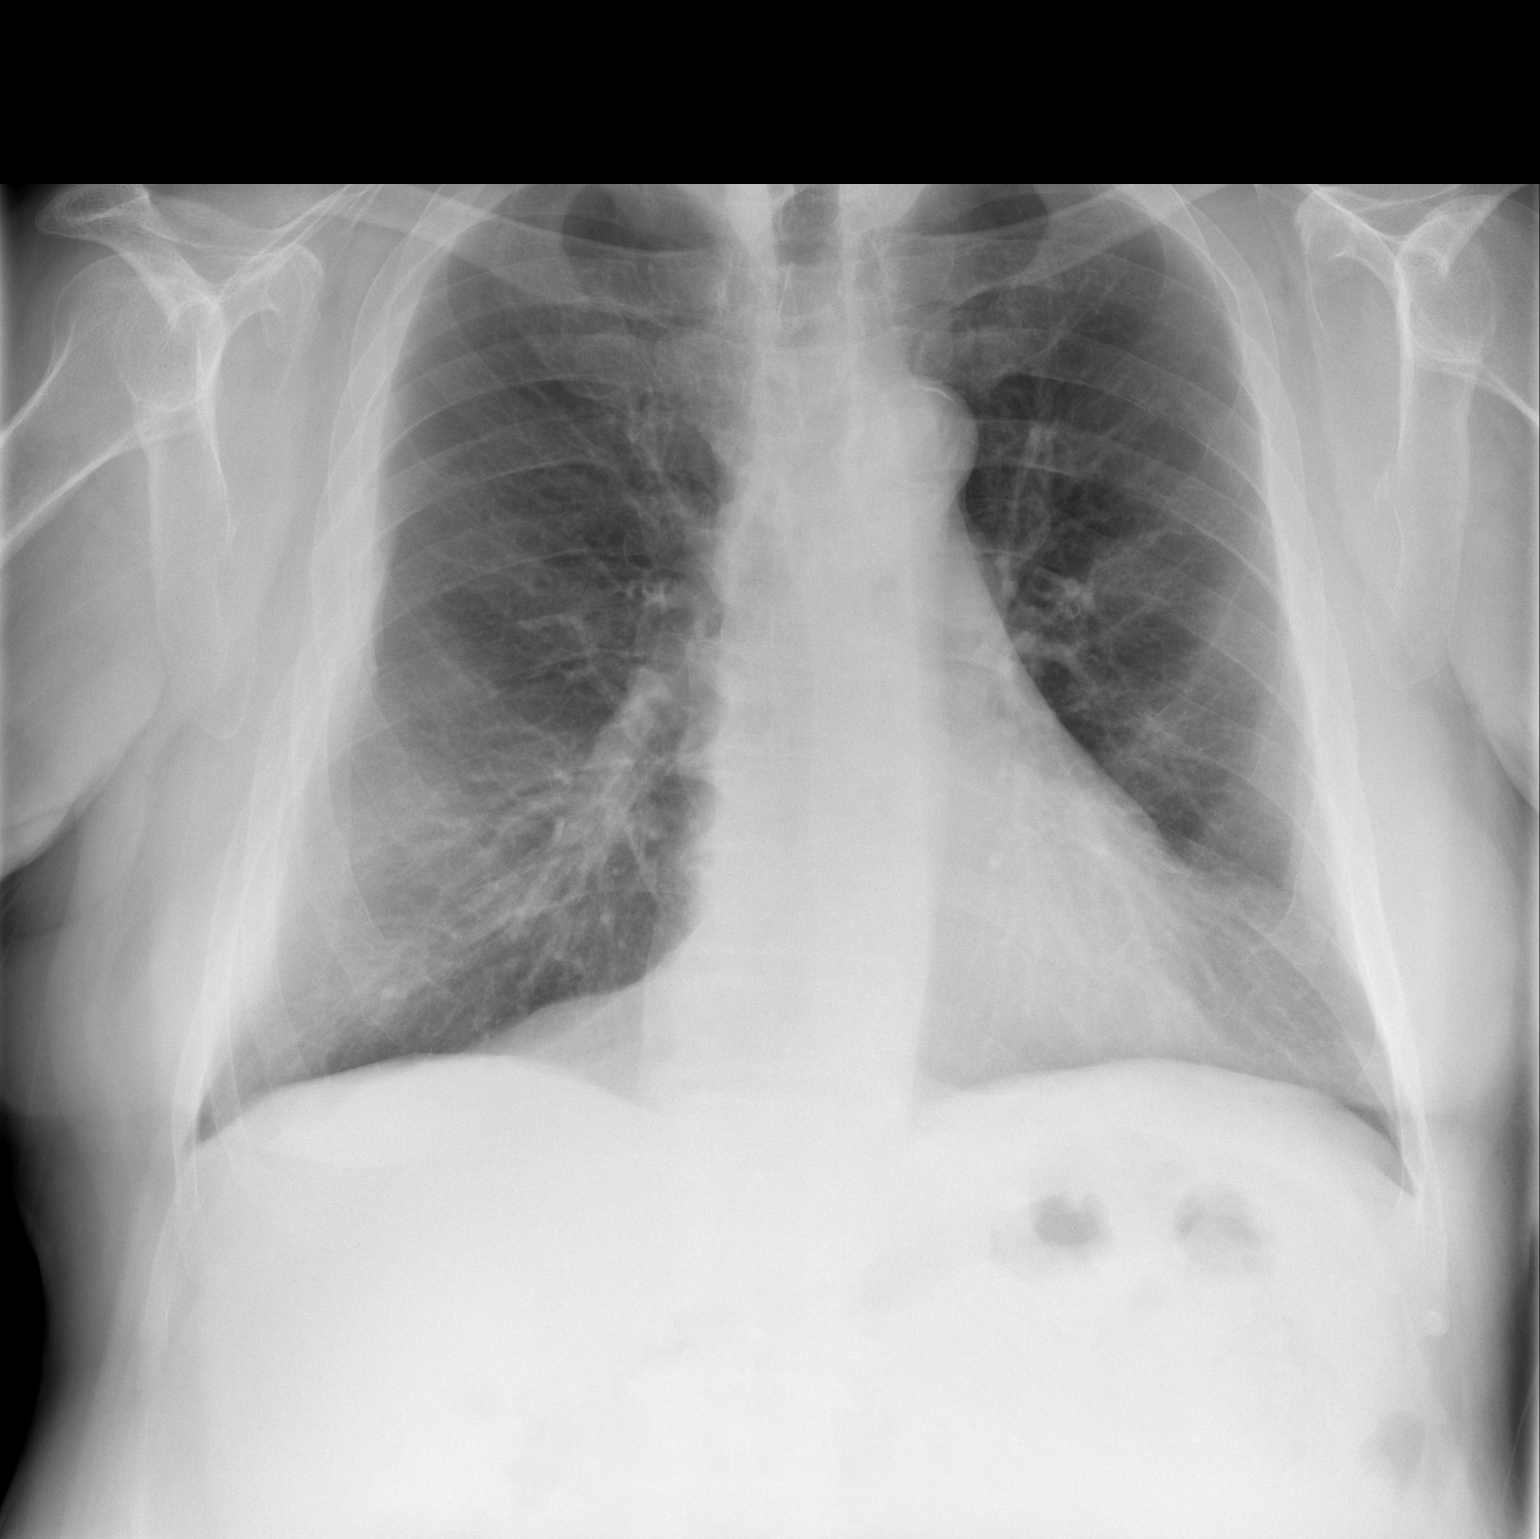

[w chest lat]
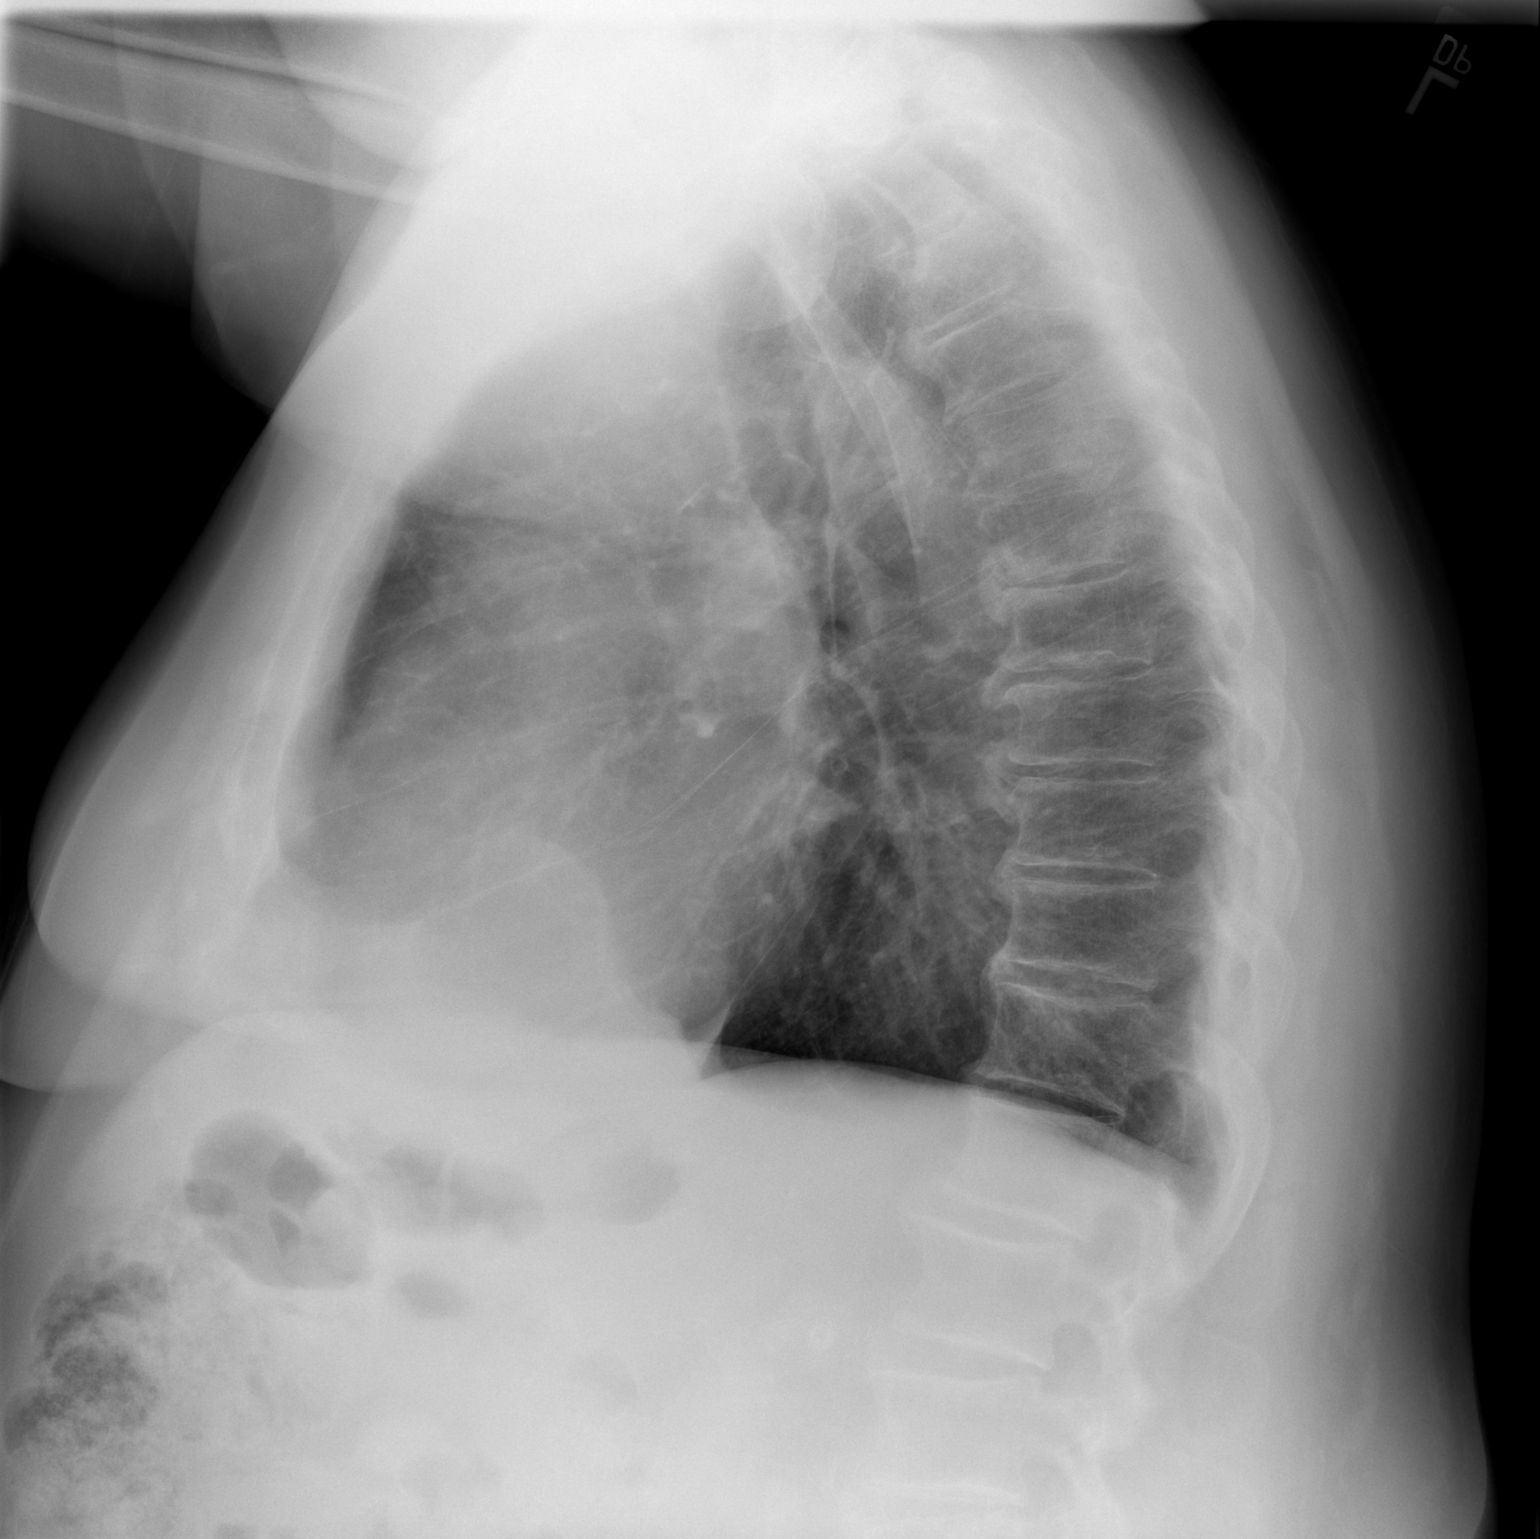

[2 of 2 positions shown; findings below may reference images not displayed]

FINDINGS: Cardiomediastinal silhouette is stable. No acute infiltrate or
pleural effusion. No pulmonary edema. Degenerative changes thoracic
spine.
IMPRESSION: No active cardiopulmonary disease.

## 2015-12-17 ENCOUNTER — Encounter (HOSPITAL_COMMUNITY): Payer: Self-pay

## 2015-12-21 DIAGNOSIS — R972 Elevated prostate specific antigen [PSA]: Secondary | ICD-10-CM | POA: Diagnosis not present

## 2015-12-25 DIAGNOSIS — H2513 Age-related nuclear cataract, bilateral: Secondary | ICD-10-CM | POA: Diagnosis not present

## 2016-01-08 ENCOUNTER — Ambulatory Visit (INDEPENDENT_AMBULATORY_CARE_PROVIDER_SITE_OTHER): Payer: Medicare Other | Admitting: Urology

## 2016-01-08 DIAGNOSIS — N401 Enlarged prostate with lower urinary tract symptoms: Secondary | ICD-10-CM | POA: Diagnosis not present

## 2016-01-08 DIAGNOSIS — R972 Elevated prostate specific antigen [PSA]: Secondary | ICD-10-CM | POA: Diagnosis not present

## 2016-01-22 DIAGNOSIS — L570 Actinic keratosis: Secondary | ICD-10-CM | POA: Diagnosis not present

## 2016-01-22 DIAGNOSIS — L400 Psoriasis vulgaris: Secondary | ICD-10-CM | POA: Diagnosis not present

## 2016-02-05 ENCOUNTER — Ambulatory Visit (INDEPENDENT_AMBULATORY_CARE_PROVIDER_SITE_OTHER): Payer: Medicare Other | Admitting: Cardiovascular Disease

## 2016-02-05 ENCOUNTER — Encounter: Payer: Self-pay | Admitting: Cardiovascular Disease

## 2016-02-05 VITALS — BP 118/72 | HR 85 | Ht 71.0 in | Wt 245.0 lb

## 2016-02-05 DIAGNOSIS — I10 Essential (primary) hypertension: Secondary | ICD-10-CM | POA: Diagnosis not present

## 2016-02-05 DIAGNOSIS — Z9861 Coronary angioplasty status: Secondary | ICD-10-CM

## 2016-02-05 DIAGNOSIS — I2510 Atherosclerotic heart disease of native coronary artery without angina pectoris: Secondary | ICD-10-CM

## 2016-02-05 DIAGNOSIS — I701 Atherosclerosis of renal artery: Secondary | ICD-10-CM | POA: Diagnosis not present

## 2016-02-05 DIAGNOSIS — I779 Disorder of arteries and arterioles, unspecified: Secondary | ICD-10-CM

## 2016-02-05 DIAGNOSIS — E785 Hyperlipidemia, unspecified: Secondary | ICD-10-CM

## 2016-02-05 DIAGNOSIS — I739 Peripheral vascular disease, unspecified: Principal | ICD-10-CM

## 2016-02-05 DIAGNOSIS — I6521 Occlusion and stenosis of right carotid artery: Secondary | ICD-10-CM

## 2016-02-05 NOTE — Patient Instructions (Signed)
Medication Instructions:  NO CHANGES.   Testing/Procedures: Your physician has requested that you have a carotid duplex. This test is an ultrasound of the carotid arteries in your neck. It looks at blood flow through these arteries that supply the brain with blood. Allow one hour for this exam. There are no restrictions or special instructions.  NOW AND IN 6 MONTHS AND EVERY 6 MONTHS THEREAFTER.   Follow-Up: We request that you follow-up in: 6 MONTHS with an extender and in 12 MONTHS with Dr Andria Rhein will receive a reminder letter in the mail two months in advance. If you don't receive a letter, please call our office to schedule the follow-up appointment.   If you need a refill on your cardiac medications before your next appointment, please call your pharmacy.

## 2016-02-05 NOTE — Assessment & Plan Note (Signed)
History of hypertension blood pressure measured 118/72. He is on Bystolic , olmesartan and amlodipine. Continue current meds at current dosing

## 2016-02-05 NOTE — Assessment & Plan Note (Signed)
History of asymptomatic high-grade right internal carotid artery stenosis with Dopplers performed 07/05/15 revealing at least a 90% right ICA stenosis. He underwent elective right carotid endarterectomy on 09/26/15 by Dr. [name]. He has recuperated nicely. I will recheck carotid Doppler studies.

## 2016-02-05 NOTE — Assessment & Plan Note (Signed)
History of hyperlipidemia on statin therapy followed by his PCP 

## 2016-02-05 NOTE — Assessment & Plan Note (Signed)
History of CAD with showing high-grade RCA stenosis and I was unable to cross and ultimately was revascularized by Dr. Claiborne Billings 03/28/08 with a Xience drug-eluting stent. He had a Myoview 09/20/15 which was low risk. He denies chest pain or shortness of breath.

## 2016-02-05 NOTE — Progress Notes (Signed)
02/05/2016 Steven Scott   June 13, 1945  [id number]  Primary Physician Glo Herring., Scott Primary Cardiologist: Steven Scott [name], [name]  HPI:  The patient is a very pleasant 70 year old, moderately overweight, married Caucasian male, father of 2 who I last saw in the office 09/04/15.  He has a history of persistent hypertension status post bilateral renal artery PTA and stenting with a positive Myoview, as well as heart catheterization that showed a high-grade distal RCA stenosis. I stented both his renal arteries successfully but was unable to cross his distal RCA, which Dr. Claiborne Billings subsequently performed successfully on March 28, 2008, with a Xience V drug-eluting stent with an excellent result. He clinically improved. He had an excellent lipid profile. Renal Dopplers performed January 31, 2011, showed progression of disease in both renal arteries suggesting high "in-stent restenosis." I performed angiography on him October 18 revealing 90% right and 70% left in-stent restenosis with 60 and 40-mm gradients, respectively. He underwent AngioSculpt cutting balloon atherectomy of both renal arteries with an excellent angiographic and followup Doppler result. His blood pressure has remained stable. He is otherwise asymptomatic. He was changed to generic antihypertensive medications off of Tribenzor and apparently his blood pressures have been more difficult to control since that time. He does admit to dietary indiscretion with regards to salt as well.  I saw [name] 04/19/12. He denies chest pain or shortness of breath. Apparently his creatinine has slowly increased now to close to 2. Recent renal Dopplers performed in our office 11/19/12 suggest rapid progression of the in-stent restenosis and throat within the left renal artery stent with a renal/aortic ratio of 7.33. Based on this, I re-angiogram him on 12/14/12 revealing an 80-90% mid in-stent restenosis" within the left renal  artery stent which I restented with a ICast covered stent. His right renal artery had 60-70% ostial stenosis at that time. I performed cutting balloon angioplasty on the right renal artery in-stent restenosis.Followup Dopplers performed last month revealed normalization of his left renal artery to aortic ratio with mild progression of disease on the right. Since I saw him in the office 6 months ago he's done well .  Renal Dopplers that suggest patent renal stents although his renal dimensions have mildly decreased. Because of high-grade right ICA stenosis I referred him to Dr. [name] performed elective endarterectomy on 09/26/15. He had minimal disease on the left. A Myoview stress test on prior to that was nonischemic.   Current Outpatient Prescriptions  Medication Sig Dispense Refill  . acetaminophen (TYLENOL) 500 MG tablet Take 500 mg by mouth every 6 (six) hours as needed for moderate pain.    Marland Kitchen aspirin EC 81 MG tablet Take 1 tablet (81 mg total) by mouth daily. Start on 11/15/14 30 tablet 1  . BYSTOLIC 5 MG tablet TAKE 1 TABLET BY MOUTH DAILY WITH BREAKFAST. 30 tablet 8  . cholecalciferol (VITAMIN D) 1000 UNITS tablet Take 1,000 Units by mouth daily.    . clobetasol cream (TEMOVATE) 2.95 % Apply 1 application topically daily as needed (Psoriasis).    . clopidogrel (PLAVIX) 75 MG tablet TAKE 1 TABLET BY MOUTH AT BEDTIME. 30 tablet 8  . diphenhydrAMINE (BENADRYL) 25 MG tablet Take 12.5 mg by mouth every 6 (six) hours as needed for allergies.    . Olmesartan-Amlodipine-HCTZ 40-10-25 MG TABS TAKE 1 TABLET BY MOUTH DAILY WITH BREAKFAST. 30 tablet 4  . pantoprazole (PROTONIX) 40 MG tablet TAKE ONE TABLET BY MOUTH ONCE DAILY. Turner  tablet 5  . simvastatin (ZOCOR) 20 MG tablet TAKE (1) TABLET BY MOUTH AT BEDTIME. 30 tablet 8   No current facility-administered medications for this visit.     Allergies  Allergen Reactions  . Penicillins Rash    Has patient had a PCN reaction causing immediate rash,  facial/tongue/throat swelling, SOB or lightheadedness with hypotension: No Has patient had a PCN reaction causing severe rash involving mucus membranes or skin necrosis: No Has patient had a PCN reaction that required hospitalization No Has patient had a PCN reaction occurring within the last 10 years: No If all of the above answers are "NO", then may proceed with Cephalosporin use.    Social History   Social History  . Marital status: Married    Spouse name: N/A  . Number of children: N/A  . Years of education: N/A   Occupational History  . Not on file.   Social History Main Topics  . Smoking status: Former Smoker    Packs/day: 2.00    Years: 50.00    Types: Cigarettes    Quit date: 02/24/2011  . Smokeless tobacco: Never Used  . Alcohol use No     Comment: 12/13/2012 "last alcohol was ~ 4 yr ago"  . Drug use: No  . Sexual activity: Not Currently   Other Topics Concern  . Not on file   Social History Narrative  . No narrative on file     Review of Systems: General: negative for chills, fever, night sweats or weight changes.  Cardiovascular: negative for chest pain, dyspnea on exertion, edema, orthopnea, palpitations, paroxysmal nocturnal dyspnea or shortness of breath Dermatological: negative for rash Respiratory: negative for cough or wheezing Urologic: negative for hematuria Abdominal: negative for nausea, vomiting, diarrhea, bright red blood per rectum, melena, or hematemesis Neurologic: negative for visual changes, syncope, or dizziness All other systems reviewed and are otherwise negative except as noted above.    Blood pressure 118/72, pulse 85, height 5\' 11"  (1.803 m), weight 245 lb (111.1 kg).  General appearance: alert and no distress Neck: no adenopathy, no carotid bruit, no JVD, supple, symmetrical, trachea midline and thyroid not enlarged, symmetric, no tenderness/mass/nodules Lungs: clear to auscultation bilaterally Heart: regular rate and rhythm, S1,  S2 normal, no murmur, click, rub or gallop Extremities: extremities normal, atraumatic, no cyanosis or edema  EKG not performed today  ASSESSMENT AND PLAN:   Hyperlipidemia History of hyperlipidemia on statin therapy followed by his PCP.  CAD -S/P RCA DES 2010 History of CAD with showing high-grade RCA stenosis and I was unable to cross and ultimately was revascularized by Dr. Claiborne Billings 03/28/08 with a Xience drug-eluting stent. He had a Myoview 09/20/15 which was low risk. He denies chest pain or shortness of breath.  Essential hypertension History of hypertension blood pressure measured 118/72. He is on Bystolic , olmesartan and amlodipine. Continue current meds at current dosing  Renal artery stenosis History of renal artery stenosis status post bilateral renal artery stenting in 2009 with left renal artery, in-stent restenosis status post recent intervention August 2014 with ICast  covered stent. He did have a 67% ostial stenosis of the right renal artery which I performed cutting balloon angioplasty on as well. We follow his renal Doppler studies. His most recent renal Doppler study performed 06/08/15 showed widely patent renal arteries.  HLD (hyperlipidemia) History of hyperlipidemia on statin therapy followed by his PCP  Asymptomatic stenosis of right carotid artery History of asymptomatic high-grade right internal carotid artery stenosis with  Dopplers performed 07/05/15 revealing at least a 90% right ICA stenosis. He underwent elective right carotid endarterectomy on 09/26/15 by Dr. [name]. He has recuperated nicely. I will recheck carotid Doppler studies.      Steven Scott FACP,FACC,FAHA, [facility] 02/05/2016 10:22 AM

## 2016-02-05 NOTE — Assessment & Plan Note (Signed)
History of renal artery stenosis status post bilateral renal artery stenting in 2009 with left renal artery, in-stent restenosis status post recent intervention August 2014 with ICast  covered stent. He did have a 67% ostial stenosis of the right renal artery which I performed cutting balloon angioplasty on as well. We follow his renal Doppler studies. His most recent renal Doppler study performed 06/08/15 showed widely patent renal arteries.

## 2016-02-06 DIAGNOSIS — N183 Chronic kidney disease, stage 3 (moderate): Secondary | ICD-10-CM | POA: Diagnosis not present

## 2016-02-06 DIAGNOSIS — R809 Proteinuria, unspecified: Secondary | ICD-10-CM | POA: Diagnosis not present

## 2016-02-06 DIAGNOSIS — Z79899 Other long term (current) drug therapy: Secondary | ICD-10-CM | POA: Diagnosis not present

## 2016-02-06 DIAGNOSIS — E559 Vitamin D deficiency, unspecified: Secondary | ICD-10-CM | POA: Diagnosis not present

## 2016-02-06 DIAGNOSIS — I129 Hypertensive chronic kidney disease with stage 1 through stage 4 chronic kidney disease, or unspecified chronic kidney disease: Secondary | ICD-10-CM | POA: Diagnosis not present

## 2016-02-06 DIAGNOSIS — D509 Iron deficiency anemia, unspecified: Secondary | ICD-10-CM | POA: Diagnosis not present

## 2016-02-08 ENCOUNTER — Ambulatory Visit (HOSPITAL_COMMUNITY)
Admission: RE | Admit: 2016-02-08 | Discharge: 2016-02-08 | Disposition: A | Discharge status: Home / Self Care | Payer: Medicare Other | Source: Ambulatory Visit | Attending: Cardiovascular Disease | Admitting: Cardiovascular Disease

## 2016-02-08 DIAGNOSIS — I6529 Occlusion and stenosis of unspecified carotid artery: Secondary | ICD-10-CM | POA: Diagnosis not present

## 2016-02-08 DIAGNOSIS — Z9861 Coronary angioplasty status: Secondary | ICD-10-CM | POA: Insufficient documentation

## 2016-02-08 DIAGNOSIS — I779 Disorder of arteries and arterioles, unspecified: Secondary | ICD-10-CM | POA: Diagnosis present

## 2016-02-08 DIAGNOSIS — I2510 Atherosclerotic heart disease of native coronary artery without angina pectoris: Secondary | ICD-10-CM

## 2016-02-08 DIAGNOSIS — I739 Peripheral vascular disease, unspecified: Secondary | ICD-10-CM

## 2016-02-08 DIAGNOSIS — I6522 Occlusion and stenosis of left carotid artery: Secondary | ICD-10-CM | POA: Diagnosis not present

## 2016-02-08 DIAGNOSIS — Z23 Encounter for immunization: Secondary | ICD-10-CM | POA: Diagnosis not present

## 2016-02-12 ENCOUNTER — Other Ambulatory Visit: Payer: Self-pay | Admitting: Cardiovascular Disease

## 2016-02-12 DIAGNOSIS — N183 Chronic kidney disease, stage 3 (moderate): Secondary | ICD-10-CM | POA: Diagnosis not present

## 2016-02-12 DIAGNOSIS — I10 Essential (primary) hypertension: Secondary | ICD-10-CM | POA: Diagnosis not present

## 2016-02-12 DIAGNOSIS — D649 Anemia, unspecified: Secondary | ICD-10-CM | POA: Diagnosis not present

## 2016-02-12 DIAGNOSIS — R809 Proteinuria, unspecified: Secondary | ICD-10-CM | POA: Diagnosis not present

## 2016-02-12 NOTE — Telephone Encounter (Signed)
Rx request sent to pharmacy.  

## 2016-02-22 ENCOUNTER — Other Ambulatory Visit: Payer: Self-pay | Admitting: Cardiology

## 2016-03-12 ENCOUNTER — Other Ambulatory Visit (INDEPENDENT_AMBULATORY_CARE_PROVIDER_SITE_OTHER): Payer: Self-pay | Admitting: Internal Medicine

## 2016-03-12 NOTE — Telephone Encounter (Signed)
Patient will need a OC before further refills. Last seen at the time of procedure 03/15/2015. If not, patient may see if PCP can fill for him.

## 2016-03-13 ENCOUNTER — Encounter (INDEPENDENT_AMBULATORY_CARE_PROVIDER_SITE_OTHER): Payer: Self-pay | Admitting: Internal Medicine

## 2016-03-13 NOTE — Telephone Encounter (Signed)
Patient was given an appointment for 06/02/16 at 11:30am with Deberah Castle, NP.  A letter was mailed to the patient.

## 2016-03-31 ENCOUNTER — Other Ambulatory Visit: Payer: Self-pay | Admitting: *Deleted

## 2016-03-31 DIAGNOSIS — Z48812 Encounter for surgical aftercare following surgery on the circulatory system: Secondary | ICD-10-CM

## 2016-03-31 DIAGNOSIS — I6523 Occlusion and stenosis of bilateral carotid arteries: Secondary | ICD-10-CM

## 2016-04-02 ENCOUNTER — Encounter: Payer: Self-pay | Admitting: Vascular Surgery

## 2016-04-10 ENCOUNTER — Ambulatory Visit: Payer: Medicare Other | Admitting: Vascular Surgery

## 2016-04-10 ENCOUNTER — Inpatient Hospital Stay (HOSPITAL_COMMUNITY): Admission: RE | Admit: 2016-04-10 | Payer: Medicare Other | Source: Ambulatory Visit

## 2016-04-11 ENCOUNTER — Other Ambulatory Visit: Payer: Self-pay | Admitting: Cardiovascular Disease

## 2016-05-12 ENCOUNTER — Other Ambulatory Visit: Payer: Self-pay | Admitting: Cardiovascular Disease

## 2016-05-29 ENCOUNTER — Ambulatory Visit: Payer: Medicare Other | Admitting: Vascular Surgery

## 2016-05-29 ENCOUNTER — Encounter (HOSPITAL_COMMUNITY): Payer: Medicare Other

## 2016-05-29 ENCOUNTER — Encounter: Payer: Self-pay | Admitting: Vascular Surgery

## 2016-05-29 ENCOUNTER — Other Ambulatory Visit (HOSPITAL_COMMUNITY): Payer: Medicare Other

## 2016-06-02 ENCOUNTER — Encounter (INDEPENDENT_AMBULATORY_CARE_PROVIDER_SITE_OTHER): Payer: Self-pay | Admitting: Internal Medicine

## 2016-06-02 ENCOUNTER — Ambulatory Visit (INDEPENDENT_AMBULATORY_CARE_PROVIDER_SITE_OTHER): Payer: Medicare Other | Admitting: Internal Medicine

## 2016-06-02 ENCOUNTER — Encounter (INDEPENDENT_AMBULATORY_CARE_PROVIDER_SITE_OTHER): Payer: Self-pay

## 2016-06-02 VITALS — BP 130/80 | HR 68 | Resp 18 | Ht 71.0 in | Wt 253.2 lb

## 2016-06-02 DIAGNOSIS — K279 Peptic ulcer, site unspecified, unspecified as acute or chronic, without hemorrhage or perforation: Secondary | ICD-10-CM | POA: Diagnosis not present

## 2016-06-02 MED ORDER — PANTOPRAZOLE SODIUM 40 MG PO TBEC: 40.0000 mg | DELAYED_RELEASE_TABLET | Freq: Every day | ORAL | 11 refills | Status: DC

## 2016-06-02 NOTE — Patient Instructions (Signed)
OV in 1 year.  

## 2016-06-02 NOTE — Progress Notes (Signed)
Subjective:    Patient ID: Steven Scott, male    DOB: Mar 30, 1946, 71 y.o.   MRN: [id number]  HPI Here today for f/u.  Hx of H pylori in 2016 and covered with Pylera  He tells me he is doing good.  He occasionally has hemorrhoids. No pain with this. He has not had any rectal bleeding. Second EGD in 2016 revealed complete healing of ulcers.  Appetite is good. No weight loss. He has actually gained about 20 pounds.  Hx of atrial fib and maintained on Plavix. No melena or BRRB  03/15/2015: EGD. Hx of upper GI bleed.  Indications:  Patient is 71 year old Caucasian male who was hospitalized about 4 months ago with upper GI bleed. He was found to have two gastric ulcers. He underwent therapeutic intervention. He has been treated for H. pylori gastritis. He remains on aspirin and Plavix although these aren't hold for the procedure. He is returning for follow-up EGD to document complete healing of these ulcers.   Impression: Mild changes of reflux esophagitis limited to GE junction. Small sliding hiatal hernia. Gastric ulcers have completely healed. Focal gastritis noted in prepyloric region.  11/08/2014 EGD      Indications:  Patient is 71 year old Caucasian male who presents with one month history of upper and lower abdominal pain,three-day history of melena and anemia. Patient is on Plavix and full dose aspirin. There is no prior history of peptic ulcer disease.                                                                Impression: Small sliding hiatal hernia. 3 x 5 mm antral ulcer with clean base. 4 x 22mm antral ulcer with tiny speck of pigment suspected to be source of bleeding but no active bleeding identified. No therapy rendered. Antral gastritis.           CBC    Component Value Date/Time   WBC 11.6 (H) 09/27/2015 0325   RBC 4.90 09/27/2015 0325   HGB 13.5 09/27/2015 0325   HCT 41.8 09/27/2015 0325   PLT 167 09/27/2015 0325   MCV 85.3 09/27/2015 0325   MCH 27.6  09/27/2015 0325   MCHC 32.3 09/27/2015 0325   RDW 14.3 09/27/2015 0325   LYMPHSABS 1.3 11/07/2014 1258   MONOABS 0.8 11/07/2014 1258   EOSABS 0.3 11/07/2014 1258   BASOSABS 0.1 11/07/2014 1258       Review of Systems Past Medical History:  Diagnosis Date  . Arthritis   . Atrial fibrillation ([place]) 03/2015  . Carotid artery occlusion   . Coronary artery disease   . GERD (gastroesophageal reflux disease)   . Headache   . History of bleeding ulcers   . Hyperlipidemia   . Hypertension   . Renal artery stenosis ([facility])    Multiple interventions  . Seasonal allergies   . Shortness of breath    "at any time" (12/13/2012)  . Status post percutaneous angioplasty of renal artery 10/18 2012   PV angiogram preformed revealing 90% right and 70% left in stent restenosis with 22mm and 66mm gradients respectively.    Past Surgical History:  Procedure Laterality Date  . CARDIAC CATHETERIZATION  03/02/2009   has moderate circumflex and diagonal branch disease with high-grade distal right disease  and fail attempt at PCI  . COLONOSCOPY  12/18/2010   Procedure: COLONOSCOPY;  Surgeon: Rogene Houston, MD;  [facility];  Service: Endoscopy;  Laterality: N/A;  . CORONARY ANGIOPLASTY WITH STENT PLACEMENT  03/28/2009   Successful PCI od high-grade 95% eccentric tandem plague stenosis in the distal right coronary artery with ultimate insertion of a 3.0 x 18 mm Xience V DES stent postdilated at 3.46 mm with the 99% stenosis being reduced to 0%  . ENDARTERECTOMY Right 09/26/2015   Procedure: RIGHT CAROTID ARTERY ENDARTERECTOMY ;  Surgeon: Elam Dutch, MD;  Location: [facility] OR;  Service: Vascular;  Laterality: Right;  . ESOPHAGOGASTRODUODENOSCOPY N/A 11/08/2014   Procedure: ESOPHAGOGASTRODUODENOSCOPY (EGD);  Surgeon: Rogene Houston, MD;  Location: [facility];  Service: Endoscopy;  Laterality: N/A;  . ESOPHAGOGASTRODUODENOSCOPY N/A 03/15/2015   Procedure: ESOPHAGOGASTRODUODENOSCOPY (EGD);   Surgeon: Rogene Houston, MD;  Location: [facility];  Service: Endoscopy;  Laterality: N/A;  1255  . LITHOTRIPSY     "laser" (12/13/2012)  . PATCH ANGIOPLASTY Right 09/26/2015   Procedure: WITH HEMASHIELD PATCH ANGIOPLASTY;  Surgeon: Elam Dutch, MD;  Location: [place];  Service: Vascular;  Laterality: Right;  . PERCUTANEOUS STENT INTERVENTION Left 12/14/2012   Procedure: PERCUTANEOUS STENT INTERVENTION;  Surgeon: [name], MD;  Location: [facility] CATH LAB;  Service: Cardiovascular;  Laterality: Left;  left renal artery  . PERCUTANEOUS STENT INTERVENTION  02/16/2014   Procedure: PERCUTANEOUS STENT INTERVENTION;  Surgeon: [name], MD;  Location: [facility] CATH LAB;  Service: Cardiovascular;;  left renal  . RENAL ANGIOGRAM N/A 12/14/2012   Procedure: RENAL ANGIOGRAM;  Surgeon: [name], MD;  Location: [facility] CATH LAB;  Service: Cardiovascular;  Laterality: N/A;  . RENAL ANGIOGRAM N/A 02/16/2014   Procedure: RENAL ANGIOGRAM;  Surgeon: [name], MD;  Location: [facility] CATH LAB;  Service: Cardiovascular;  Laterality: N/A;  . RENAL ARTERY ANGIOPLASTY Bilateral 02/2014   PV restenting L RA with an ICast covered stent and cutting balloon atherectomy R RA  . RENAL ARTERY STENT Bilateral 2010    Allergies  Allergen Reactions  . Penicillins Rash    Has patient had a PCN reaction causing immediate rash, facial/tongue/throat swelling, SOB or lightheadedness with hypotension: No Has patient had a PCN reaction causing severe rash involving mucus membranes or skin necrosis: No Has patient had a PCN reaction that required hospitalization No Has patient had a PCN reaction occurring within the last 10 years: No If all of the above answers are "NO", then may proceed with Cephalosporin use.    Current Outpatient Prescriptions on File Prior to Visit  Medication Sig Dispense Refill  . acetaminophen (TYLENOL) 500 MG tablet Take 500 mg by mouth every 6 (six) hours as needed for moderate pain.    Marland Kitchen  aspirin EC 81 MG tablet Take 1 tablet (81 mg total) by mouth daily. Start on 11/15/14 30 tablet 1  . BYSTOLIC 5 MG tablet TAKE 1 TABLET BY MOUTH DAILY WITH BREAKFAST. 30 tablet 11  . cholecalciferol (VITAMIN D) 1000 UNITS tablet Take 1,000 Units by mouth daily.    . clobetasol cream (TEMOVATE) 4.27 % Apply 1 application topically daily as needed (Psoriasis).    . clopidogrel (PLAVIX) 75 MG tablet TAKE 1 TABLET BY MOUTH AT BEDTIME. 30 tablet 11  . diphenhydrAMINE (BENADRYL) 25 MG tablet Take 12.5 mg by mouth every 6 (six) hours as needed for allergies.    . Olmesartan-Amlodipine-HCTZ 40-10-25 MG TABS TAKE 1  TABLET BY MOUTH DAILY WITH BREAKFAST. 30 tablet 11  . pantoprazole (PROTONIX) 40 MG tablet TAKE ONE TABLET BY MOUTH ONCE DAILY. 30 tablet 2  . simvastatin (ZOCOR) 20 MG tablet TAKE (1) TABLET BY MOUTH AT BEDTIME. 30 tablet 11   No current facility-administered medications on file prior to visit.        Objective:   Physical Exam Blood pressure 130/80, pulse 68, resp. rate 18, height 5\' 11"  (1.803 m), weight 253 lb 3.2 oz (114.9 kg). Alert and oriented. Skin warm and dry. Oral mucosa is moist.   . Sclera anicteric, conjunctivae is pink. Thyroid not enlarged. No cervical lymphadenopathy. Lungs clear. Heart regular rate and rhythm.  Abdomen is soft. Bowel sounds are positive. No hepatomegaly. No abdominal masses felt. No tenderness.  No edema to lower extremities.          Assessment & Plan:  PUD. Continue the Protonix. If any problems, call our office OV in one year.

## 2016-06-05 ENCOUNTER — Ambulatory Visit: Payer: Medicare Other

## 2016-06-05 ENCOUNTER — Encounter: Payer: Self-pay | Admitting: Vascular Surgery

## 2016-06-05 ENCOUNTER — Encounter (HOSPITAL_COMMUNITY): Payer: Medicare Other

## 2016-06-05 ENCOUNTER — Ambulatory Visit (HOSPITAL_COMMUNITY)
Admission: RE | Admit: 2016-06-05 | Discharge: 2016-06-05 | Disposition: A | Discharge status: Home / Self Care | Payer: Medicare Other | Source: Ambulatory Visit | Attending: Vascular Surgery | Admitting: Vascular Surgery

## 2016-06-05 ENCOUNTER — Telehealth (HOSPITAL_COMMUNITY): Payer: Self-pay | Admitting: *Deleted

## 2016-06-05 ENCOUNTER — Ambulatory Visit (INDEPENDENT_AMBULATORY_CARE_PROVIDER_SITE_OTHER): Payer: Medicare Other | Admitting: Vascular Surgery

## 2016-06-05 VITALS — BP 136/70 | HR 100 | Temp 97.5°F | Resp 20 | Ht 71.0 in | Wt 252.0 lb

## 2016-06-05 DIAGNOSIS — Z48812 Encounter for surgical aftercare following surgery on the circulatory system: Secondary | ICD-10-CM

## 2016-06-05 DIAGNOSIS — I6521 Occlusion and stenosis of right carotid artery: Secondary | ICD-10-CM | POA: Diagnosis not present

## 2016-06-05 DIAGNOSIS — I6523 Occlusion and stenosis of bilateral carotid arteries: Secondary | ICD-10-CM | POA: Diagnosis not present

## 2016-06-05 NOTE — Telephone Encounter (Signed)
I spoke with Steven Scott this morning to discuss his carotid duplex appointment with our office this morning.  Since he had recently had a carotid duplex at the [facility] location we decided to cancel the duplex ordered here today.  I asked patient to be here at 12:30 to register for his 12:45 appointment with Dr. Oneida Alar and confirmed with the patient that he needed to keep that appointment.

## 2016-06-05 NOTE — Progress Notes (Addendum)
Patient is a 71 year old male who returns for follow-up today. He underwent right carotid endarterectomy 5/17. He is currently on aspirin and Plavix. He denies any headaches or any TIA or stroke or amaurosis symptoms. He has had some swelling in the right side of his neck. He has no swallowing difficulties. He has complained of what sounds like some urinary retention-type symptoms. He has a history of BPH as well as some mild renal dysfunction. He has a known 40-60% stenosis on the contralateral side.   Physical exam:  Vitals:   06/05/16 1228 06/05/16 1233  BP: 140/78 136/70  Pulse: 98 100  Resp: 20   Temp: 97.5 F (36.4 C)   TempSrc: Oral   SpO2: 98%   Weight: 252 lb (114.3 kg)   Height: 5\' 11"  (1.803 m)    Neck: Well-healed right neck scar still some peri-incisional numbness no carotid bruit   Neuro: Symmetric upper and lower extremity motor strength 5 over 5 tongue midline  Data: Patient had a carotid duplex exam performed at [facility] September 2017. I reviewed the findings of this today. This showed no significant stenosis on the right side. Less than 50% stenosis on the left side.   Assessment: Doing well post carotid endarterectomy.    Plan: The patient will follow-up with our nurse practitioner in 6 months time for repeat carotid duplex exam.  He will continue his Plavix and aspirin.   Ruta Hinds, MD [facility] Office: [phone] Pager: [phone]

## 2016-06-10 NOTE — Addendum Note (Signed)
Addended by: Lianne Cure A on: 06/10/2016 02:42 PM   Modules accepted: Orders

## 2016-06-12 ENCOUNTER — Other Ambulatory Visit: Payer: Self-pay | Admitting: Cardiovascular Disease

## 2016-06-24 DIAGNOSIS — R809 Proteinuria, unspecified: Secondary | ICD-10-CM | POA: Diagnosis not present

## 2016-06-24 DIAGNOSIS — N183 Chronic kidney disease, stage 3 (moderate): Secondary | ICD-10-CM | POA: Diagnosis not present

## 2016-06-24 DIAGNOSIS — D509 Iron deficiency anemia, unspecified: Secondary | ICD-10-CM | POA: Diagnosis not present

## 2016-06-24 DIAGNOSIS — E559 Vitamin D deficiency, unspecified: Secondary | ICD-10-CM | POA: Diagnosis not present

## 2016-06-24 DIAGNOSIS — I129 Hypertensive chronic kidney disease with stage 1 through stage 4 chronic kidney disease, or unspecified chronic kidney disease: Secondary | ICD-10-CM | POA: Diagnosis not present

## 2016-06-24 DIAGNOSIS — Z79899 Other long term (current) drug therapy: Secondary | ICD-10-CM | POA: Diagnosis not present

## 2016-07-01 DIAGNOSIS — I10 Essential (primary) hypertension: Secondary | ICD-10-CM | POA: Diagnosis not present

## 2016-07-01 DIAGNOSIS — N183 Chronic kidney disease, stage 3 (moderate): Secondary | ICD-10-CM | POA: Diagnosis not present

## 2016-07-01 DIAGNOSIS — E669 Obesity, unspecified: Secondary | ICD-10-CM | POA: Diagnosis not present

## 2016-07-01 DIAGNOSIS — R809 Proteinuria, unspecified: Secondary | ICD-10-CM | POA: Diagnosis not present

## 2016-08-05 ENCOUNTER — Ambulatory Visit (INDEPENDENT_AMBULATORY_CARE_PROVIDER_SITE_OTHER): Payer: Medicare Other | Admitting: Cardiology

## 2016-08-05 ENCOUNTER — Encounter: Payer: Self-pay | Admitting: Cardiology

## 2016-08-05 VITALS — BP 150/80 | HR 96 | Ht 71.0 in | Wt 259.0 lb

## 2016-08-05 DIAGNOSIS — I482 Chronic atrial fibrillation, unspecified: Secondary | ICD-10-CM

## 2016-08-05 DIAGNOSIS — I6523 Occlusion and stenosis of bilateral carotid arteries: Secondary | ICD-10-CM

## 2016-08-05 LAB — BASIC METABOLIC PANEL
BUN: 33 mg/dL — ABNORMAL HIGH (ref 7–25)
CO2: 26 mmol/L (ref 20–31)
Calcium: 9.6 mg/dL (ref 8.6–10.3)
Chloride: 101 mmol/L (ref 98–110)
Creat: 2.24 mg/dL — ABNORMAL HIGH (ref 0.70–1.18)
Glucose, Bld: 96 mg/dL (ref 65–99)
Potassium: 4.3 mmol/L (ref 3.5–5.3)
Sodium: 139 mmol/L (ref 135–146)

## 2016-08-05 LAB — CBC
HCT: 47.3 % (ref 38.5–50.0)
Hemoglobin: 15.5 g/dL (ref 13.2–17.1)
MCH: 29.4 pg (ref 27.0–33.0)
MCHC: 32.8 g/dL (ref 32.0–36.0)
MCV: 89.6 fL (ref 80.0–100.0)
MPV: 11.5 fL (ref 7.5–12.5)
Platelets: 216 10*3/uL (ref 140–400)
RBC: 5.28 MIL/uL (ref 4.20–5.80)
RDW: 14.6 % (ref 11.0–15.0)
WBC: 9.8 10*3/uL (ref 3.8–10.8)

## 2016-08-05 MED ORDER — APIXABAN 5 MG PO TABS: 5.0000 mg | ORAL_TABLET | Freq: Two times a day (BID) | ORAL | 5 refills | Status: DC

## 2016-08-05 MED ORDER — APIXABAN 5 MG PO TABS: 5.0000 mg | ORAL_TABLET | Freq: Two times a day (BID) | ORAL | 0 refills | Status: DC

## 2016-08-05 NOTE — Assessment & Plan Note (Signed)
Truncal obesity

## 2016-08-05 NOTE — Progress Notes (Addendum)
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 He is status post bilateral renal artery PTA and stenting in 2009, 2012, and July 2014. He has CAD and had an RCA DES in 2009. Myoview was done pre op RCEA in May 2017 and was low risk. LOV with Dr Gwenlyn Found was Sept 2017 and he was doing well.    When the patient was seen in Nov 2016 he was noted to be in AF with CVR. He was relatively asymptomatic. He is a CHADs VASc=3 but had GI bleeding in Feb 2016 and was felt to be too high risk for anticoagulation at that time.   He is in the office today for follow up. He has some DOE but attributes this to wgt gain (30 lbs) over the winter. He remains in AF with CVR. He denies any chest pain. He has had no GI bleeding.   Current Outpatient Prescriptions  Medication Sig Dispense Refill  . acetaminophen (TYLENOL) 500 MG tablet Take 500 mg by mouth every 6 (six) hours as needed for moderate pain.    Marland Kitchen aspirin EC 81 MG tablet Take 1 tablet (81 mg total) by mouth daily. Start on 11/15/14 30 tablet 1  . BYSTOLIC 5 MG tablet TAKE 1 TABLET BY MOUTH DAILY WITH BREAKFAST. 30 tablet 11  . cholecalciferol (VITAMIN D) 1000 UNITS tablet Take 1,000 Units by mouth daily.    . clobetasol cream (TEMOVATE) 8.34 % Apply 1 application topically daily as needed (Psoriasis).    . diphenhydrAMINE (BENADRYL) 25 MG tablet Take 12.5 mg by mouth every 6 (six) hours as needed for allergies.    . Olmesartan-Amlodipine-HCTZ 40-10-25 MG TABS TAKE 1 TABLET BY MOUTH DAILY WITH BREAKFAST. 30 tablet 2  . pantoprazole (PROTONIX) 40 MG tablet Take 1 tablet (40 mg total) by mouth daily. 30 tablet 11  . simvastatin (ZOCOR) 20 MG tablet TAKE (1) TABLET BY MOUTH AT BEDTIME. 30 tablet 11  . apixaban (ELIQUIS) 5 MG TABS  tablet Take 1 tablet (5 mg total) by mouth 2 (two) times daily. 60 tablet 5   No current facility-administered medications for this visit.     Allergies  Allergen Reactions  . Penicillins Rash    Has patient had a PCN reaction causing immediate rash, facial/tongue/throat swelling, SOB or lightheadedness with hypotension: No Has patient had a PCN reaction causing severe rash involving mucus membranes or skin necrosis: No Has patient had a PCN reaction that required hospitalization No Has patient had a PCN reaction occurring within the last 10 years: No If all of the above answers are "NO", then may proceed with Cephalosporin use.    Past Medical History:  Diagnosis Date  . Arthritis   . Atrial fibrillation ([place]) 03/2015  . Carotid artery occlusion   . Coronary artery disease   . GERD (gastroesophageal reflux disease)   . Headache   . History of bleeding ulcers   . Hyperlipidemia   . Hypertension   . Renal artery stenosis ([facility])    Multiple interventions  . Seasonal allergies   . Shortness of breath    "at any time" (12/13/2012)  . Status post percutaneous angioplasty of renal artery 10/18 2012   PV angiogram preformed revealing 90% right and 70% left in stent restenosis with  23mm and 15mm gradients respectively.    Social History   Social History  . Marital status: Married    Spouse name: N/A  . Number of children: N/A  . Years of education: N/A   Occupational History  . Not on file.   Social History Main Topics  . Smoking status: Former Smoker    Packs/day: 2.00    Years: 50.00    Types: Cigarettes    Quit date: 02/24/2011  . Smokeless tobacco: Never Used  . Alcohol use No     Comment: 12/13/2012 "last alcohol was ~ 4 yr ago"  . Drug use: No  . Sexual activity: Not Currently   Other Topics Concern  . Not on file   Social History Narrative  . No narrative on file     Family History  Problem Relation Age of Onset  . COPD Father 44    deceased      Review of Systems: General: negative for chills, fever, night sweats or weight changes.  Cardiovascular: negative for chest pain, dyspnea on exertion, edema, orthopnea, palpitations, paroxysmal nocturnal dyspnea or shortness of breath Dermatological: negative for rash Respiratory: negative for cough or wheezing Urologic: negative for hematuria Abdominal: negative for nausea, vomiting, diarrhea, bright red blood per rectum, melena, or hematemesis Neurologic: negative for visual changes, syncope, or dizziness All other systems reviewed and are otherwise negative except as noted above.    Blood pressure (!) 150/80, pulse 96, height 5\' 11"  (1.803 m), weight 259 lb (117.5 kg).  General appearance: alert, cooperative, no distress and morbidly obese Neck: no carotid bruit and no JVD Lungs: clear to auscultation bilaterally Heart: irregularly irregular rhythm Abdomen: truncal obesity Extremities: extremities normal, atraumatic, no cyanosis or edema Skin: Skin color, texture, turgor normal. No rashes or lesions Neurologic: Grossly normal  EKG AF with VR 90-QV2  ASSESSMENT AND PLAN:   Chronic atrial fibrillation ([place]) Noted in Nov 2016. His rate is controlled and he has been asymptomatic. He was not initially anticoagulated secondary to GI bleeding in Feb 2016. He is CHADs VASc=3. Discussed with Dr Gwenlyn Found- start Eliquis 5 mg BID, stop Plavix. Check BMP and CBC in 4 weeks.  CKD (chronic kidney disease) stage 3, GFR 30-59 ml/min Last SCr was 1.9  Obesity Truncal obesity  CAD -S/P RCA DES 2010 No angina but some DOE he attributes to increased wgt gain (30 lbs) over the winter. Myoview and echo essentially normal May 2017  Essential hypertension Controlled  Renal artery stenosis Bilat RA PTA '09, Lt RA ISR- PTA Aug 2014, Bilateral RA PTA 02/16/14   PLAN  Discussed with Dr Gwenlyn Found- will add Eliquis 5 mg BID and stop Plavix. F/U labs as noted.   [name] PA-C 08/05/2016 4:29  PM

## 2016-08-05 NOTE — Assessment & Plan Note (Signed)
Bilat RA PTA '09, Lt RA ISR- PTA Aug 2014, Bilateral RA PTA 02/16/14

## 2016-08-05 NOTE — Assessment & Plan Note (Signed)
No angina but some DOE he attributes to increased wgt gain (30 lbs) over the winter

## 2016-08-05 NOTE — Assessment & Plan Note (Signed)
Controlled.  

## 2016-08-05 NOTE — Patient Instructions (Addendum)
Medication Instructions:  STOP Plavix START Eliquis 5mg  Take 1 tablet by mouth twice a day CONTINUE  Aspirin 81 mg Once a day  Labwork: Your physician recommends that you return for lab work in: [place], [place]  Your physician recommends that you return for lab work in: [place] 09/06/2016  Testing/Procedures: None   Follow-Up: Your physician recommends that you schedule a follow-up appointment in: [place].  Any Other Special Instructions Will Be Listed Below (If Applicable).  If you need a refill on your cardiac medications before your next appointment, please call your pharmacy.

## 2016-08-05 NOTE — Assessment & Plan Note (Signed)
Noted in Nov 2016. His rate is controlled and he has been felt to be asymptomatic. He was not initially anticoagulated secondary to GI bleeding Feb 2016. He is CHADs VASc=3. Discussed with Dr Gwenlyn Found- start Eliquis 5 mg BID, stop Plavix. Check BMP and CBC in 4 weeks.

## 2016-08-05 NOTE — Assessment & Plan Note (Signed)
Last SCr was 1.2

## 2016-08-08 ENCOUNTER — Telehealth: Payer: Self-pay | Admitting: *Deleted

## 2016-08-08 NOTE — Telephone Encounter (Signed)
See results note. 

## 2016-08-08 NOTE — Telephone Encounter (Signed)
-----   Message from Erlene Quan, [place] sent at 08/07/2016  8:02 AM EDT ----- Please let pt know I reviewed his renal function labs. He has CRI stage 3 but this is not new for him. He should f/u with his PCP to follow this.  [name] PA-C 08/07/2016 8:02 AM

## 2016-08-08 NOTE — Telephone Encounter (Signed)
Left msg to call.

## 2016-08-08 NOTE — Telephone Encounter (Signed)
New Message ° ° pt verbalized that he is returning call for rn  °

## 2016-08-08 NOTE — Telephone Encounter (Signed)
Follow up ° ° ° ° ° °Returning a call to the nurse °

## 2016-09-05 DIAGNOSIS — I482 Chronic atrial fibrillation: Secondary | ICD-10-CM | POA: Diagnosis not present

## 2016-09-05 LAB — CBC
HCT: 45.5 % (ref 38.5–50.0)
Hemoglobin: 14.9 g/dL (ref 13.2–17.1)
MCH: 29.5 pg (ref 27.0–33.0)
MCHC: 32.7 g/dL (ref 32.0–36.0)
MCV: 90.1 fL (ref 80.0–100.0)
MPV: 12.3 fL (ref 7.5–12.5)
Platelets: 206 10*3/uL (ref 140–400)
RBC: 5.05 MIL/uL (ref 4.20–5.80)
RDW: 14.9 % (ref 11.0–15.0)
WBC: 7.8 10*3/uL (ref 3.8–10.8)

## 2016-09-06 LAB — BASIC METABOLIC PANEL
BUN: 27 mg/dL — ABNORMAL HIGH (ref 7–25)
CO2: 24 mmol/L (ref 20–31)
Calcium: 8.8 mg/dL (ref 8.6–10.3)
Chloride: 105 mmol/L (ref 98–110)
Creat: 2.22 mg/dL — ABNORMAL HIGH (ref 0.70–1.18)
Glucose, Bld: 121 mg/dL — ABNORMAL HIGH (ref 65–99)
Potassium: 4.3 mmol/L (ref 3.5–5.3)
Sodium: 141 mmol/L (ref 135–146)

## 2016-09-09 ENCOUNTER — Telehealth: Payer: Self-pay | Admitting: Cardiovascular Disease

## 2016-09-09 ENCOUNTER — Other Ambulatory Visit: Payer: Self-pay | Admitting: Cardiovascular Disease

## 2016-09-09 NOTE — Telephone Encounter (Signed)
Returned the call to the patient to inform him that his labs looked stable:  Notes recorded by Erlene Quan, PA-C on 09/08/2016 at 8:01 AM EDT Let pt know his lab looked looked stable.  He verbalized his understanding.

## 2016-09-09 NOTE — Telephone Encounter (Signed)
Pt returning a call to [place] concerning his labs. Please call pt

## 2016-09-09 NOTE — Telephone Encounter (Signed)
REFILL 

## 2016-10-10 ENCOUNTER — Other Ambulatory Visit: Payer: Self-pay | Admitting: Cardiology

## 2016-10-10 ENCOUNTER — Other Ambulatory Visit: Payer: Self-pay | Admitting: Pharmacist Clinician (PhC)/ Clinical Pharmacy Specialist

## 2016-10-10 DIAGNOSIS — I482 Chronic atrial fibrillation, unspecified: Secondary | ICD-10-CM

## 2016-10-10 MED ORDER — APIXABAN 5 MG PO TABS
5.0000 mg | ORAL_TABLET | Freq: Two times a day (BID) | ORAL | 5 refills | Status: DC
Start: 2016-10-10 — End: 2017-07-10

## 2016-11-05 ENCOUNTER — Ambulatory Visit (INDEPENDENT_AMBULATORY_CARE_PROVIDER_SITE_OTHER): Payer: Medicare Other | Admitting: Cardiovascular Disease

## 2016-11-05 ENCOUNTER — Encounter: Payer: Self-pay | Admitting: Cardiovascular Disease

## 2016-11-05 VITALS — BP 164/82 | HR 94 | Ht 71.0 in | Wt 260.0 lb

## 2016-11-05 DIAGNOSIS — I701 Atherosclerosis of renal artery: Secondary | ICD-10-CM | POA: Diagnosis not present

## 2016-11-05 DIAGNOSIS — I6521 Occlusion and stenosis of right carotid artery: Secondary | ICD-10-CM

## 2016-11-05 DIAGNOSIS — I2510 Atherosclerotic heart disease of native coronary artery without angina pectoris: Secondary | ICD-10-CM

## 2016-11-05 DIAGNOSIS — E559 Vitamin D deficiency, unspecified: Secondary | ICD-10-CM | POA: Diagnosis not present

## 2016-11-05 DIAGNOSIS — I482 Chronic atrial fibrillation, unspecified: Secondary | ICD-10-CM

## 2016-11-05 DIAGNOSIS — Z9861 Coronary angioplasty status: Secondary | ICD-10-CM | POA: Diagnosis not present

## 2016-11-05 DIAGNOSIS — I10 Essential (primary) hypertension: Secondary | ICD-10-CM | POA: Diagnosis not present

## 2016-11-05 DIAGNOSIS — E785 Hyperlipidemia, unspecified: Secondary | ICD-10-CM

## 2016-11-05 DIAGNOSIS — D509 Iron deficiency anemia, unspecified: Secondary | ICD-10-CM | POA: Diagnosis not present

## 2016-11-05 DIAGNOSIS — N183 Chronic kidney disease, stage 3 (moderate): Secondary | ICD-10-CM | POA: Diagnosis not present

## 2016-11-05 DIAGNOSIS — R809 Proteinuria, unspecified: Secondary | ICD-10-CM | POA: Diagnosis not present

## 2016-11-05 DIAGNOSIS — Z79899 Other long term (current) drug therapy: Secondary | ICD-10-CM | POA: Diagnosis not present

## 2016-11-05 DIAGNOSIS — I129 Hypertensive chronic kidney disease with stage 1 through stage 4 chronic kidney disease, or unspecified chronic kidney disease: Secondary | ICD-10-CM | POA: Diagnosis not present

## 2016-11-05 NOTE — Progress Notes (Signed)
11/05/2016 Steven Scott   11-26-1945  [id number]  Primary Physician Redmond School, MD Primary Cardiologist: [name] MD Steven Scott, [name]  HPI:  The patient is a very pleasant 71 year old, moderately overweight, married Caucasian male, father of 2 who I last saw in the office 02/05/16. He has a history of persistent hypertension status post bilateral renal artery PTA and stenting with a positive Myoview, as well as heart catheterization that showed a high-grade distal RCA stenosis. I stented both his renal arteries successfully but was unable to cross his distal RCA, which Dr. Claiborne Billings subsequently performed successfully on March 28, 2008, with a Xience V drug-eluting stent with an excellent result. He clinically improved. He had an excellent lipid profile. Renal Dopplers performed January 31, 2011, showed progression of disease in both renal arteries suggesting high "in-stent restenosis." I performed angiography on him October 18 revealing 90% right and 70% left in-stent restenosis with 60 and 40-mm gradients, respectively. He underwent AngioSculpt cutting balloon atherectomy of both renal arteries with an excellent angiographic and followup Doppler result. His blood pressure has remained stable. He is otherwise asymptomatic. He was changed to generic antihypertensive medications off of Tribenzor and apparently his blood pressures have been more difficult to control since that time. He does admit to dietary indiscretion with regards to salt as well.  I saw Mr. [name] 04/19/12. He denies chest pain or shortness of breath. Apparently his creatinine has slowly increased now to close to 2. Recent renal Dopplers performed in our office 11/19/12 suggest rapid progression of the in-stent restenosis and throat within the left renal artery stent with a renal/aortic ratio of 7.33. Based on this, I re-angiogram him on 12/14/12 revealing an 80-90% mid in-stent restenosis" within the left renal  artery stent which I restented with a ICast covered stent. His right renal artery had 60-70% ostial stenosis at that time. I performed cutting balloon angioplasty on the right renal artery in-stent restenosis.Followup Dopplers performed last month revealed normalization of his left renal artery to aortic ratio with mild progression of disease on the right. Since I saw him in the office 6 months ago he's done well .  Renal Dopplers that suggest patent renal stents although his renal dimensions have mildly decreased. Because of high-grade right ICA stenosis I referred him to Dr. [name] performed elective endarterectomy on 09/26/15. He had minimal disease on the left. A Myoview stress test on prior to that was nonischemic. Since I saw him back close to a year ago he remained medically stable. He has had some mild weight loss. He needs to dietary indiscretion with regard to salt and has some mild swelling of his ankles.   Current Outpatient Prescriptions  Medication Sig Dispense Refill  . acetaminophen (TYLENOL) 500 MG tablet Take 500 mg by mouth every 6 (six) hours as needed for moderate pain.    Marland Kitchen apixaban (ELIQUIS) 5 MG TABS tablet Take 1 tablet (5 mg total) by mouth 2 (two) times daily. 60 tablet 5  . aspirin EC 81 MG tablet Take 1 tablet (81 mg total) by mouth daily. Start on 11/15/14 30 tablet 1  . BYSTOLIC 5 MG tablet TAKE 1 TABLET BY MOUTH DAILY WITH BREAKFAST. 30 tablet 11  . cholecalciferol (VITAMIN D) 1000 UNITS tablet Take 1,000 Units by mouth daily.    . clobetasol cream (TEMOVATE) 6.29 % Apply 1 application topically daily as needed (Psoriasis).    . diphenhydrAMINE (BENADRYL) 25 MG tablet Take 12.5 mg by  mouth every 6 (six) hours as needed for allergies.    . Olmesartan-Amlodipine-HCTZ 40-10-25 MG TABS TAKE 1 TABLET BY MOUTH DAILY WITH BREAKFAST. 30 tablet 6  . pantoprazole (PROTONIX) 40 MG tablet Take 1 tablet (40 mg total) by mouth daily. 30 tablet 11  . simvastatin (ZOCOR) 20 MG tablet  TAKE (1) TABLET BY MOUTH AT BEDTIME. 30 tablet 11   No current facility-administered medications for this visit.     Allergies  Allergen Reactions  . Penicillins Rash    Has patient had a PCN reaction causing immediate rash, facial/tongue/throat swelling, SOB or lightheadedness with hypotension: No Has patient had a PCN reaction causing severe rash involving mucus membranes or skin necrosis: No Has patient had a PCN reaction that required hospitalization No Has patient had a PCN reaction occurring within the last 10 years: No If all of the above answers are "NO", then may proceed with Cephalosporin use.    Social History   Social History  . Marital status: Married    Spouse name: N/A  . Number of children: N/A  . Years of education: N/A   Occupational History  . Not on file.   Social History Main Topics  . Smoking status: Former Smoker    Packs/day: 2.00    Years: 50.00    Types: Cigarettes    Quit date: 02/24/2011  . Smokeless tobacco: Never Used  . Alcohol use No     Comment: 12/13/2012 "last alcohol was ~ 4 yr ago"  . Drug use: No  . Sexual activity: Not Currently   Other Topics Concern  . Not on file   Social History Narrative  . No narrative on file     Review of Systems: General: negative for chills, fever, night sweats or weight changes.  Cardiovascular: negative for chest pain, dyspnea on exertion, edema, orthopnea, palpitations, paroxysmal nocturnal dyspnea or shortness of breath Dermatological: negative for rash Respiratory: negative for cough or wheezing Urologic: negative for hematuria Abdominal: negative for nausea, vomiting, diarrhea, bright red blood per rectum, melena, or hematemesis Neurologic: negative for visual changes, syncope, or dizziness All other systems reviewed and are otherwise negative except as noted above.    Blood pressure (!) 164/82, pulse 94, height 5\' 11"  (1.803 m), weight 260 lb (117.9 kg), SpO2 98 %.  General appearance:  alert and no distress Neck: no adenopathy, no carotid bruit, no JVD, supple, symmetrical, trachea midline and thyroid not enlarged, symmetric, no tenderness/mass/nodules Lungs: clear to auscultation bilaterally Heart: irregularly irregular rhythm Extremities: race bilateral ankle edema  EKG Not performed today  ASSESSMENT AND PLAN:   Hyperlipidemia History of hyperlipidemia on statin therapy. We will check a lipid and liver profile  CAD -S/P RCA DES 2010 History of CAD status post RCA PCI and stenting with a Xience drug-eluting stent by Dr. Claiborne Billings 03/28/08. This was done after a positive Myoview stress test. His last Myoview performed 09/20/15 was nonischemic.  Essential hypertension History of essential hypertension blood pressure measured at 164/82. He says he checks his blood pressure, and is usually in the 19/41/7408 range systolic. He is on by systolic, olmesartan, amlodipine and hydrochlorothiazide. Continue current meds are current dosing  Renal artery stenosis History of bilateral renal artery stenosis status post bilateral renal artery stenting, bilateral renal artery cutting balloon atherectomy followed by left renal artery restenting using an ICast  covered stent with re-cutting balloon atherectomy of the right renal artery.His last renal Doppler study performed 06/08/15 revealed a normal right renal aortic ratio  with the renal dimension of 8.3 cm and a mildly elevated left with a renal dimension of 9.8 cm. There was a decrease in bilateral kidney sizes. We will repeat renal Doppler studies.  Chronic atrial fibrillation (HCC) History of chronic atrial fibrillation rate controlled on oral anticoagulation with Eliquis .  Asymptomatic stenosis of right carotid artery History of asymptomatic right internal carotid artery stenosis status post elective right carotid endarterectomy performed by Dr. [name] at my request 09/26/15. Myoview performed prior to that study for preoperative  clearance was negative. He had minimal disease on the left. His Dopplers are currently performed at [facility].      [name] MD [facility], [facility] 11/05/2016 11:39 AM

## 2016-11-05 NOTE — Assessment & Plan Note (Signed)
History of CAD status post RCA PCI and stenting with a Xience drug-eluting stent by Dr. Claiborne Billings 03/28/08. This was done after a positive Myoview stress test. His last Myoview performed 09/20/15 was nonischemic.

## 2016-11-05 NOTE — Assessment & Plan Note (Signed)
History of hyperlipidemia on statin therapy. We will check a lipid and liver profile 

## 2016-11-05 NOTE — Assessment & Plan Note (Signed)
History of asymptomatic right internal carotid artery stenosis status post elective right carotid endarterectomy performed by Dr. [name] at my request 09/26/15. Myoview performed prior to that study for preoperative clearance was negative. He had minimal disease on the left. His Dopplers are currently performed at [facility].

## 2016-11-05 NOTE — Assessment & Plan Note (Signed)
History of bilateral renal artery stenosis status post bilateral renal artery stenting, bilateral renal artery cutting balloon atherectomy followed by left renal artery restenting using an ICast  covered stent with re-cutting balloon atherectomy of the right renal artery.His last renal Doppler study performed 06/08/15 revealed a normal right renal aortic ratio with the renal dimension of 8.3 cm and a mildly elevated left with a renal dimension of 9.8 cm. There was a decrease in bilateral kidney sizes. We will repeat renal Doppler studies.

## 2016-11-05 NOTE — Assessment & Plan Note (Signed)
History of essential hypertension blood pressure measured at 164/82. He says he checks his blood pressure, and is usually in the 25/74/9355 range systolic. He is on by systolic, olmesartan, amlodipine and hydrochlorothiazide. Continue current meds are current dosing

## 2016-11-05 NOTE — Assessment & Plan Note (Signed)
History of chronic atrial fibrillation rate controlled on oral anticoagulation with Eliquis .

## 2016-11-05 NOTE — Patient Instructions (Addendum)
Medication Instructions: Your physician recommends that you continue on your current medications as directed. Please refer to the Current Medication list given to you today.  Labwork: Your physician recommends that you return for a FASTING lipid profile and hepatic function panel.   Testing/Procedures: Your physician has requested that you have a renal artery duplex. During this test, an ultrasound is used to evaluate blood flow to the kidneys. Allow one hour for this exam. Do not eat after midnight the day before and avoid carbonated beverages. Take your medications as you usually do.   Follow-Up: Your physician wants you to follow-up in: 1 year with Dr. Gwenlyn Found. You will receive a reminder letter in the mail two months in advance. If you don't receive a letter, please call our office to schedule the follow-up appointment.  If you need a refill on your cardiac medications before your next appointment, please call your pharmacy.

## 2016-11-11 DIAGNOSIS — D649 Anemia, unspecified: Secondary | ICD-10-CM | POA: Diagnosis not present

## 2016-11-11 DIAGNOSIS — I10 Essential (primary) hypertension: Secondary | ICD-10-CM | POA: Diagnosis not present

## 2016-11-11 DIAGNOSIS — N183 Chronic kidney disease, stage 3 (moderate): Secondary | ICD-10-CM | POA: Diagnosis not present

## 2016-11-11 DIAGNOSIS — N250 Renal osteodystrophy: Secondary | ICD-10-CM | POA: Diagnosis not present

## 2016-11-18 DIAGNOSIS — Z1389 Encounter for screening for other disorder: Secondary | ICD-10-CM | POA: Diagnosis not present

## 2016-11-18 DIAGNOSIS — E6609 Other obesity due to excess calories: Secondary | ICD-10-CM | POA: Diagnosis not present

## 2016-11-18 DIAGNOSIS — Z6836 Body mass index (BMI) 36.0-36.9, adult: Secondary | ICD-10-CM | POA: Diagnosis not present

## 2016-11-18 DIAGNOSIS — Z0000 Encounter for general adult medical examination without abnormal findings: Secondary | ICD-10-CM | POA: Diagnosis not present

## 2016-11-18 DIAGNOSIS — E785 Hyperlipidemia, unspecified: Secondary | ICD-10-CM | POA: Diagnosis not present

## 2016-11-18 DIAGNOSIS — I10 Essential (primary) hypertension: Secondary | ICD-10-CM | POA: Diagnosis not present

## 2016-11-20 DIAGNOSIS — I2510 Atherosclerotic heart disease of native coronary artery without angina pectoris: Secondary | ICD-10-CM | POA: Diagnosis not present

## 2016-11-20 DIAGNOSIS — Z6836 Body mass index (BMI) 36.0-36.9, adult: Secondary | ICD-10-CM | POA: Diagnosis not present

## 2016-11-20 DIAGNOSIS — E782 Mixed hyperlipidemia: Secondary | ICD-10-CM | POA: Diagnosis not present

## 2016-11-20 DIAGNOSIS — I10 Essential (primary) hypertension: Secondary | ICD-10-CM | POA: Diagnosis not present

## 2016-11-21 ENCOUNTER — Ambulatory Visit (HOSPITAL_COMMUNITY)
Admission: RE | Admit: 2016-11-21 | Discharge: 2016-11-21 | Disposition: A | Discharge status: Home / Self Care | Payer: Medicare Other | Source: Ambulatory Visit | Attending: Cardiology | Admitting: Cardiology

## 2016-11-21 DIAGNOSIS — Z87891 Personal history of nicotine dependence: Secondary | ICD-10-CM | POA: Diagnosis not present

## 2016-11-21 DIAGNOSIS — I739 Peripheral vascular disease, unspecified: Secondary | ICD-10-CM | POA: Diagnosis not present

## 2016-11-21 DIAGNOSIS — I701 Atherosclerosis of renal artery: Secondary | ICD-10-CM

## 2016-11-21 DIAGNOSIS — I10 Essential (primary) hypertension: Secondary | ICD-10-CM | POA: Insufficient documentation

## 2016-11-21 DIAGNOSIS — I2510 Atherosclerotic heart disease of native coronary artery without angina pectoris: Secondary | ICD-10-CM | POA: Diagnosis not present

## 2016-11-21 DIAGNOSIS — E785 Hyperlipidemia, unspecified: Secondary | ICD-10-CM | POA: Diagnosis not present

## 2016-12-02 ENCOUNTER — Encounter: Payer: Self-pay | Admitting: Cardiovascular Disease

## 2016-12-02 ENCOUNTER — Ambulatory Visit (INDEPENDENT_AMBULATORY_CARE_PROVIDER_SITE_OTHER): Payer: Medicare Other | Admitting: Cardiovascular Disease

## 2016-12-02 ENCOUNTER — Encounter: Payer: Self-pay | Admitting: Family

## 2016-12-02 VITALS — BP 152/78 | HR 58 | Ht 71.0 in | Wt 260.0 lb

## 2016-12-02 DIAGNOSIS — I701 Atherosclerosis of renal artery: Secondary | ICD-10-CM | POA: Diagnosis not present

## 2016-12-02 NOTE — Patient Instructions (Signed)
Medication Instructions: Your physician recommends that you continue on your current medications as directed. Please refer to the Current Medication list given to you today.   Testing/Procedures: Your physician has requested that you have a renal artery duplex in January 2019. During this test, an ultrasound is used to evaluate blood flow to the kidneys. Allow one hour for this exam. Do not eat after midnight the day before and avoid carbonated beverages. Take your medications as you usually do.  Follow-Up: Your physician recommends that you schedule a follow-up appointment in: January 2019 with Dr. Veleta Miners doppler.  If you need a refill on your cardiac medications before your next appointment, please call your pharmacy.

## 2016-12-02 NOTE — Assessment & Plan Note (Signed)
Mr. Steven Scott returns today for follow-up of his renal Doppler studies which were recently performed 11/21/16. He's had multiple interventions on both renal arteries including stenting, Cutting Balloon atherectomy and ICast Covered stenting of his left renal artery for "in-stent restenosis as recently as 12/14/12. These Dopplers revealed an occluded right renal artery with a small nonfunctioning right kidney measuring 7 cm. His left kidney had increased in dimension from 9.8 cm on 06/08/15 up to 11.5 cm. His velocities have slightly increased as well. I'm going to increase the frequency of his renal Doppler studies to every 6 months. Should these show an increase we will plan on re-intervention.

## 2016-12-02 NOTE — Progress Notes (Signed)
Steven Scott returns today for follow-up of his renal Doppler studies which were recently performed 11/21/16. He's had multiple interventions on both renal arteries including stenting, Cutting Balloon atherectomy and ICast Covered stenting of his left renal artery for "in-stent restenosis as recently as 12/14/12. These Dopplers revealed an occluded right renal artery with a small nonfunctioning right kidney measuring 7 cm. His left kidney had increased in dimension from 9.8 cm on 06/08/15 up to 11.5 cm. His velocities have slightly increased as well. I'm going to increase the frequency of his renal Doppler studies to every 6 months. Should these show an increase we will plan on re-intervention.

## 2016-12-12 ENCOUNTER — Telehealth: Payer: Self-pay | Admitting: Cardiovascular Disease

## 2016-12-12 NOTE — Telephone Encounter (Signed)
Returned call to patient - he is currently on Eliquis and is in the donut hole. He received a bill from [name] and it went up about $180.   Samples of Eliquis 5mg  - 1 box ([id number] Exp: 04/2019) left for patient Samples of Bystolic 5mg  - 4 boxes ([id number] Exp: 02/2019) left for patient  He will pick up on 8/9. Advised to call back before this date to inquire of more eliquis samples  eliquis patient assistance paperwork provided with samples w/instructions to call our office if he wishes to submit so that MD can submit his portion + Rx; MD portion given to Lovena Le, CMA to have MD complete and send in

## 2016-12-12 NOTE — Telephone Encounter (Signed)
New message   Pt is calling asking for a call back. He wouldn't say what it was in regards to, just it's important.

## 2016-12-18 ENCOUNTER — Ambulatory Visit (INDEPENDENT_AMBULATORY_CARE_PROVIDER_SITE_OTHER): Payer: Medicare Other | Admitting: Family

## 2016-12-18 ENCOUNTER — Encounter: Payer: Self-pay | Admitting: Family

## 2016-12-18 ENCOUNTER — Ambulatory Visit (HOSPITAL_COMMUNITY)
Admission: RE | Admit: 2016-12-18 | Discharge: 2016-12-18 | Disposition: A | Discharge status: Home / Self Care | Payer: Medicare Other | Source: Ambulatory Visit | Attending: Vascular Surgery | Admitting: Vascular Surgery

## 2016-12-18 VITALS — BP 143/80 | HR 84 | Temp 98.3°F | Resp 20 | Ht 71.0 in | Wt 259.1 lb

## 2016-12-18 DIAGNOSIS — I701 Atherosclerosis of renal artery: Secondary | ICD-10-CM

## 2016-12-18 DIAGNOSIS — Z9889 Other specified postprocedural states: Secondary | ICD-10-CM

## 2016-12-18 DIAGNOSIS — I6521 Occlusion and stenosis of right carotid artery: Secondary | ICD-10-CM | POA: Diagnosis not present

## 2016-12-18 DIAGNOSIS — I6523 Occlusion and stenosis of bilateral carotid arteries: Secondary | ICD-10-CM | POA: Diagnosis not present

## 2016-12-18 LAB — VAS US CAROTID
LCCADDIAS: -23 cm/s
LCCADSYS: -136 cm/s
LCCAPDIAS: 15 cm/s
LEFT ECA DIAS: -14 cm/s
LEFT VERTEBRAL DIAS: -8 cm/s
LICADDIAS: -10 cm/s
LICADSYS: -55 cm/s
LICAPDIAS: -21 cm/s
Left CCA prox sys: 78 cm/s
Left ICA prox sys: -144 cm/s
RCCAPSYS: 86 cm/s
RIGHT CCA MID DIAS: 12 cm/s
RIGHT ECA DIAS: -31 cm/s
RIGHT VERTEBRAL DIAS: 15 cm/s
Right CCA prox dias: 14 cm/s
Right cca dist sys: -94 cm/s

## 2016-12-18 NOTE — Telephone Encounter (Signed)
Follow up   Pt is calling to see if his samples are ready for him to pick up?

## 2016-12-18 NOTE — Telephone Encounter (Signed)
Returned call to patient bystolic and eliquis samples left at [employer] office front desk.

## 2016-12-18 NOTE — Patient Instructions (Signed)
Stroke Prevention Some medical conditions and behaviors are associated with an increased chance of having a stroke. You may prevent a stroke by making healthy choices and managing medical conditions. How can I reduce my risk of having a stroke?  Stay physically active. Get at least 30 minutes of activity on most or all days.  Do not smoke. It may also be helpful to avoid exposure to secondhand smoke.  Limit alcohol use. Moderate alcohol use is considered to be: ? No more than 2 drinks per day for men. ? No more than 1 drink per day for nonpregnant women.  Eat healthy foods. This involves: ? Eating 5 or more servings of fruits and vegetables a day. ? Making dietary changes that address high blood pressure (hypertension), high cholesterol, diabetes, or obesity.  Manage your cholesterol levels. ? Making food choices that are high in fiber and low in saturated fat, trans fat, and cholesterol may control cholesterol levels. ? Take any prescribed medicines to control cholesterol as directed by your health care provider.  Manage your diabetes. ? Controlling your carbohydrate and sugar intake is recommended to manage diabetes. ? Take any prescribed medicines to control diabetes as directed by your health care provider.  Control your hypertension. ? Making food choices that are low in salt (sodium), saturated fat, trans fat, and cholesterol is recommended to manage hypertension. ? Ask your health care provider if you need treatment to lower your blood pressure. Take any prescribed medicines to control hypertension as directed by your health care provider. ? If you are 18-39 years of age, have your blood pressure checked every 3-5 years. If you are 40 years of age or older, have your blood pressure checked every year.  Maintain a healthy weight. ? Reducing calorie intake and making food choices that are low in sodium, saturated fat, trans fat, and cholesterol are recommended to manage  weight.  Stop drug abuse.  Avoid taking birth control pills. ? Talk to your health care provider about the risks of taking birth control pills if you are over 35 years old, smoke, get migraines, or have ever had a blood clot.  Get evaluated for sleep disorders (sleep apnea). ? Talk to your health care provider about getting a sleep evaluation if you snore a lot or have excessive sleepiness.  Take medicines only as directed by your health care provider. ? For some people, aspirin or blood thinners (anticoagulants) are helpful in reducing the risk of forming abnormal blood clots that can lead to stroke. If you have the irregular heart rhythm of atrial fibrillation, you should be on a blood thinner unless there is a good reason you cannot take them. ? Understand all your medicine instructions.  Make sure that other conditions (such as anemia or atherosclerosis) are addressed. Get help right away if:  You have sudden weakness or numbness of the face, arm, or leg, especially on one side of the body.  Your face or eyelid droops to one side.  You have sudden confusion.  You have trouble speaking (aphasia) or understanding.  You have sudden trouble seeing in one or both eyes.  You have sudden trouble walking.  You have dizziness.  You have a loss of balance or coordination.  You have a sudden, severe headache with no known cause.  You have new chest pain or an irregular heartbeat. Any of these symptoms may represent a serious problem that is an emergency. Do not wait to see if the symptoms will go away.   Get medical help at once. Call your local emergency services (911 in [place] Do not drive yourself to the hospital. This information is not intended to replace advice given to you by your health care provider. Make sure you discuss any questions you have with your health care provider. Document Released: 06/05/2004 Document Revised: 10/04/2015 Document Reviewed: 10/29/2012 Elsevier  Interactive Patient Education  2017 Elsevier Inc.     Preventing Cerebrovascular Disease Arteries are blood vessels that carry blood that contains oxygen from the heart to all parts of the body. Cerebrovascular disease affects arteries that supply the brain. Any condition that blocks or disrupts blood flow to the brain can cause cerebrovascular disease. Brain cells that lose blood supply start to die within minutes (stroke). Stroke is the main danger of cerebrovascular disease. Atherosclerosis and high blood pressure are common causes of cerebrovascular disease. Atherosclerosis is narrowing and hardening of an artery that results when fat, cholesterol, calcium, or other substances (plaque) build up inside an artery. Plaque reduces blood flow through the artery. High blood pressure increases the risk of bleeding inside the brain. Making diet and lifestyle changes to prevent atherosclerosis and high blood pressure lowers your risk of cerebrovascular disease. What nutrition changes can be made?  Eat more fruits, vegetables, and whole grains.  Reduce how much saturated fat you eat. To do this, eat less red meat and fewer full-fat dairy products.  Eat healthy proteins instead of red meat. Healthy proteins include: ? Fish. Eat fish that contains heart-healthy omega-3 fatty acids, twice a week. Examples include salmon, albacore tuna, mackerel, and herring. ? Chicken. ? Nuts. ? Low-fat or nonfat yogurt.  Avoid processed meats, like bacon and lunchmeat.  Avoid foods that contain: ? A lot of sugar, such as sweets and drinks with added sugar. ? A lot of salt (sodium). Avoid adding extra salt to your food, as told by your health care provider. ? Trans fats, such as margarine and baked goods. Trans fats may be listed as "partially hydrogenated oils" on food labels.  Check food labels to see how much sodium, sugar, and trans fats are in foods.  Use vegetable oils that contain low amounts of  saturated fat, such as olive oil or canola oil. What lifestyle changes can be made?  Drink alcohol in moderation. This means no more than 1 drink a day for nonpregnant women and 2 drinks a day for men. One drink equals 12 oz of beer, 5 oz of wine, or 1 oz of hard liquor.  If you are overweight, ask your health care provider to recommend a weight-loss plan for you. Losing 5-10 lb (2.2-4.5 kg) can reduce your risk of diabetes, atherosclerosis, and high blood pressure.  Exercise for 30?60 minutes on most days, or as much as told by your health care provider. ? Do moderate-intensity exercise, such as brisk walking, bicycling, and water aerobics. Ask your health care provider which activities are safe for you.  Do not use any products that contain nicotine or tobacco, such as cigarettes and e-cigarettes. If you need help quitting, ask your health care provider. Why are these changes important? Making these changes lowers your risk of many diseases that can cause cerebrovascular disease and stroke. Stroke is a leading cause of death and disability. Making these changes also improves your overall health and quality of life. What can I do to lower my risk? The following factors make you more likely to develop cerebrovascular disease:  Being overweight.  Smoking.  Being physically inactive.    Eating a high-fat diet.  Having certain health conditions, such as: ? Diabetes. ? High blood pressure. ? Heart disease. ? Atherosclerosis. ? High cholesterol. ? Sickle cell disease.  Talk with your health care provider about your risk for cerebrovascular disease. Work with your health care provider to control diseases that you have that may contribute to cerebrovascular disease. Your health care provider may prescribe medicines to help prevent major causes of cerebrovascular disease. Where to find more information: Learn more about preventing cerebrovascular disease from:  National Heart, Lung, and  Blood Institute: www.nhlbi.nih.gov/health/health-topics/topics/stroke  Centers for Disease Control and Prevention: cdc.gov/stroke/about.htm  Summary  Cerebrovascular disease can lead to a stroke.  Atherosclerosis and high blood pressure are major causes of cerebrovascular disease.  Making diet and lifestyle changes can reduce your risk of cerebrovascular disease.  Work with your health care provider to get your risk factors under control to reduce your risk of cerebrovascular disease. This information is not intended to replace advice given to you by your health care provider. Make sure you discuss any questions you have with your health care provider. Document Released: 05/13/2015 Document Revised: 11/16/2015 Document Reviewed: 05/13/2015 Elsevier Interactive Patient Education  2018 Elsevier Inc.  

## 2016-12-18 NOTE — Progress Notes (Signed)
Chief Complaint: Follow up Extracranial Carotid Artery Stenosis   History of Present Illness  Steven Scott is a 71 y.o. male who returns for follow-up today. He underwent right carotid endarterectomy on 09/26/15 by Dr. Oneida Alar.   He denies any known history of stroke or TIA. Specifically he denies a history of amaurosis fugax or monocular blindness, unilateral facial drooping, hemiplegia, or receptive or expressive aphasia.  The swelling in the right side of his neck has resolved. He has no swallowing difficulties.  He has a history of BPH as well as some mild renal dysfunction.   Dr. Oneida Alar last evaluated pt on 06-05-16. At that time his right side of neck scar still had some peri-incisional numbness, no carotid bruit. Symmetric upper and lower extremity motor strength 5 over 5, tongue midline. Carotid duplex exam performed at [facility] September 2017 showed no significant stenosis on the right side, ess than 50% stenosis on the left side. Doing well post carotid endarterectomy.  The patient was to follow-up with our nurse practitioner in 6 months time for repeat carotid duplex exam.    Dr. Gwenlyn Found has been monitoring pt renal arteries; pt has bilateral renal artery stents, also states his right kidney has stopped functioning.    Pt Diabetic: no Pt smoker: former smoker, quit in 2012  Pt meds include: Statin : yes ASA: yes Other anticoagulants/antiplatelets: Eliquis, pt has atrial fib   Past Medical History:  Diagnosis Date  . Arthritis   . Atrial fibrillation ([place]) 03/2015  . Carotid artery occlusion   . Coronary artery disease   . GERD (gastroesophageal reflux disease)   . Headache   . History of bleeding ulcers   . Hyperlipidemia   . Hypertension   . Renal artery stenosis ([facility])    Multiple interventions  . Seasonal allergies   . Shortness of breath    "at any time" (12/13/2012)  . Status post percutaneous angioplasty of renal artery 10/18 2012   PV  angiogram preformed revealing 90% right and 70% left in stent restenosis with 30mm and 26mm gradients respectively.    Social History Social History  Substance Use Topics  . Smoking status: Former Smoker    Packs/day: 2.00    Years: 50.00    Types: Cigarettes    Quit date: 02/24/2011  . Smokeless tobacco: Never Used  . Alcohol use No     Comment: 12/13/2012 "last alcohol was ~ 4 yr ago"    Family History Family History  Problem Relation Age of Onset  . COPD Father 65       deceased    Surgical History Past Surgical History:  Procedure Laterality Date  . CARDIAC CATHETERIZATION  03/02/2009   has moderate circumflex and diagonal branch disease with high-grade distal right disease and fail attempt at PCI  . COLONOSCOPY  12/18/2010   Procedure: COLONOSCOPY;  Surgeon: Rogene Houston, MD;  [facility];  Service: Endoscopy;  Laterality: N/A;  . CORONARY ANGIOPLASTY WITH STENT PLACEMENT  03/28/2009   Successful PCI od high-grade 95% eccentric tandem plague stenosis in the distal right coronary artery with ultimate insertion of a 3.0 x 18 mm Xience V DES stent postdilated at 3.46 mm with the 99% stenosis being reduced to 0%  . ENDARTERECTOMY Right 09/26/2015   Procedure: RIGHT CAROTID ARTERY ENDARTERECTOMY ;  Surgeon: Elam Dutch, MD;  Location: [facility] OR;  Service: Vascular;  Laterality: Right;  . ESOPHAGOGASTRODUODENOSCOPY N/A 11/08/2014   Procedure: ESOPHAGOGASTRODUODENOSCOPY (EGD);  Surgeon: Rogene Houston, MD;  Location: [facility];  Service: Endoscopy;  Laterality: N/A;  . ESOPHAGOGASTRODUODENOSCOPY N/A 03/15/2015   Procedure: ESOPHAGOGASTRODUODENOSCOPY (EGD);  Surgeon: Rogene Houston, MD;  Location: [facility];  Service: Endoscopy;  Laterality: N/A;  1255  . LITHOTRIPSY     "laser" (12/13/2012)  . PATCH ANGIOPLASTY Right 09/26/2015   Procedure: WITH HEMASHIELD PATCH ANGIOPLASTY;  Surgeon: Elam Dutch, MD;  Location: [facility];  Service: Vascular;  Laterality:  Right;  . PERCUTANEOUS STENT INTERVENTION Left 12/14/2012   Procedure: PERCUTANEOUS STENT INTERVENTION;  Surgeon: [name], MD;  Location: [facility] CATH LAB;  Service: Cardiovascular;  Laterality: Left;  left renal artery  . PERCUTANEOUS STENT INTERVENTION  02/16/2014   Procedure: PERCUTANEOUS STENT INTERVENTION;  Surgeon: [name], MD;  Location: [facility] CATH LAB;  Service: Cardiovascular;;  left renal  . RENAL ANGIOGRAM N/A 12/14/2012   Procedure: RENAL ANGIOGRAM;  Surgeon: [name], MD;  Location: [facility] CATH LAB;  Service: Cardiovascular;  Laterality: N/A;  . RENAL ANGIOGRAM N/A 02/16/2014   Procedure: RENAL ANGIOGRAM;  Surgeon: [name], MD;  Location: [facility] CATH LAB;  Service: Cardiovascular;  Laterality: N/A;  . RENAL ARTERY ANGIOPLASTY Bilateral 02/2014   PV restenting L RA with an ICast covered stent and cutting balloon atherectomy R RA  . RENAL ARTERY STENT Bilateral 2010    Allergies  Allergen Reactions  . Penicillins Rash    Has patient had a PCN reaction causing immediate rash, facial/tongue/throat swelling, SOB or lightheadedness with hypotension: No Has patient had a PCN reaction causing severe rash involving mucus membranes or skin necrosis: No Has patient had a PCN reaction that required hospitalization No Has patient had a PCN reaction occurring within the last 10 years: No If all of the above answers are "NO", then may proceed with Cephalosporin use.    Current Outpatient Prescriptions  Medication Sig Dispense Refill  . acetaminophen (TYLENOL) 500 MG tablet Take 500 mg by mouth every 6 (six) hours as needed for moderate pain.    Marland Kitchen apixaban (ELIQUIS) 5 MG TABS tablet Take 1 tablet (5 mg total) by mouth 2 (two) times daily. 60 tablet 5  . aspirin EC 81 MG tablet Take 1 tablet (81 mg total) by mouth daily. Start on 11/15/14 30 tablet 1  . BYSTOLIC 5 MG tablet TAKE 1 TABLET BY MOUTH DAILY WITH BREAKFAST. 30 tablet 11  . cholecalciferol (VITAMIN D) 1000 UNITS tablet  Take 1,000 Units by mouth daily.    . clobetasol cream (TEMOVATE) 8.31 % Apply 1 application topically daily as needed (Psoriasis).    . diphenhydrAMINE (BENADRYL) 25 MG tablet Take 12.5 mg by mouth every 6 (six) hours as needed for allergies.    . Olmesartan-Amlodipine-HCTZ 40-10-25 MG TABS TAKE 1 TABLET BY MOUTH DAILY WITH BREAKFAST. 30 tablet 6  . pantoprazole (PROTONIX) 40 MG tablet Take 1 tablet (40 mg total) by mouth daily. 30 tablet 11  . simvastatin (ZOCOR) 20 MG tablet TAKE (1) TABLET BY MOUTH AT BEDTIME. 30 tablet 11   No current facility-administered medications for this visit.     Review of Systems : See HPI for pertinent positives and negatives.  Physical Examination  Vitals:   12/18/16 1452 12/18/16 1454  BP: (!) 157/82 (!) 143/80  Pulse: 84   Resp: 20   Temp: 98.3 F (36.8 C)   TempSrc: Oral   SpO2: 95%   Weight: 259 lb 1.6 oz (117.5 kg)   Height: 5'  11" (1.803 m)    Body mass index is 36.14 kg/m.  General: WDWN obese male in NAD GAIT: normal Eyes: PERRLA Pulmonary:  Respirations are non-labored, good air movement, CTAB, no rales, rhonchi, or wheezing.  Cardiac: Irregular rhythm, controlled rate, no detected murmur.  VASCULAR EXAM Carotid Bruits Right Left   Negative Negative     Abdominal aortic pulse is not palpable. Radial pulses are 1+ palpable and equal.                                                                                                                            LE Pulses Right Left       POPLITEAL  not palpable   no palpable       POSTERIOR TIBIAL  faintly palpable   faintly palpable        DORSALIS PEDIS      ANTERIOR TIBIAL not palpable  not palpable     Gastrointestinal: soft, nontender, BS WNL, no r/g, no palpable masses.  Musculoskeletal: No muscle atrophy/wasting. M/S 5/5 throughout, extremities without ischemic changes.  Neurologic:  A&O X 3; appropriate affect, sensation is normal; speech is normal, CN 2-12 intact,  pain and light touch intact in extremities, motor exam as listed above.    Assessment: Steven Scott is a 71 y.o. male is s/p  right carotid endarterectomy on 09/26/15.  He has no history of stroke or TIA.    DATA Carotid Duplex (12-18-16): Right ICA: CEA site with no restenosis. Left ICA: 1-39% stenosis. Bilateral vertebral artery flow is antegrade.  Bilateral subclavian artery waveforms are normal.  No previous post CEA duplex for comparison.    Plan: Follow-up in 1 year with Carotid Duplex scan.   I discussed in depth with the patient the nature of atherosclerosis, and emphasized the importance of maximal medical management including strict control of blood pressure, blood glucose, and lipid levels, obtaining regular exercise, and continued cessation of smoking.  The patient is aware that without maximal medical management the underlying atherosclerotic disease process will progress, limiting the benefit of any interventions. The patient was given information about stroke prevention and what symptoms should prompt the patient to seek immediate medical care. Thank you for allowing [place] to participate in this patient's care.  Clemon Chambers, RN, MSN, FNP-C [facility] Office: [phone]  Clinic Physician: Oneida Alar  12/18/16 3:09 PM

## 2016-12-23 DIAGNOSIS — E785 Hyperlipidemia, unspecified: Secondary | ICD-10-CM | POA: Diagnosis not present

## 2016-12-23 DIAGNOSIS — R972 Elevated prostate specific antigen [PSA]: Secondary | ICD-10-CM | POA: Diagnosis not present

## 2016-12-24 LAB — LIPID PANEL
CHOLESTEROL TOTAL: 103 mg/dL (ref 100–199)
Chol/HDL Ratio: 2.9 ratio (ref 0.0–5.0)
HDL: 36 mg/dL — ABNORMAL LOW (ref >39)
LDL Calculated: 51 mg/dL (ref 0–99)
TRIGLYCERIDES: 82 mg/dL (ref 0–149)
VLDL Cholesterol Cal: 16 mg/dL (ref 5–40)

## 2016-12-24 LAB — HEPATIC FUNCTION PANEL
ALK PHOS: 77 IU/L (ref 39–117)
ALT: 16 IU/L (ref 0–44)
AST: 17 IU/L (ref 0–40)
Albumin: 4 g/dL (ref 3.5–4.8)
BILIRUBIN TOTAL: 0.4 mg/dL (ref 0.0–1.2)
BILIRUBIN, DIRECT: 0.15 mg/dL (ref 0.00–0.40)
Total Protein: 6.7 g/dL (ref 6.0–8.5)

## 2016-12-26 NOTE — Addendum Note (Signed)
Addended by: Lianne Cure A on: 12/26/2016 01:22 PM   Modules accepted: Orders

## 2016-12-30 ENCOUNTER — Ambulatory Visit (INDEPENDENT_AMBULATORY_CARE_PROVIDER_SITE_OTHER): Payer: Medicare Other | Admitting: Urology

## 2016-12-30 DIAGNOSIS — R972 Elevated prostate specific antigen [PSA]: Secondary | ICD-10-CM | POA: Diagnosis not present

## 2016-12-30 DIAGNOSIS — N401 Enlarged prostate with lower urinary tract symptoms: Secondary | ICD-10-CM

## 2016-12-30 DIAGNOSIS — R351 Nocturia: Secondary | ICD-10-CM

## 2017-01-21 DIAGNOSIS — L400 Psoriasis vulgaris: Secondary | ICD-10-CM | POA: Diagnosis not present

## 2017-01-21 DIAGNOSIS — L570 Actinic keratosis: Secondary | ICD-10-CM | POA: Diagnosis not present

## 2017-02-10 ENCOUNTER — Other Ambulatory Visit: Payer: Self-pay | Admitting: Cardiovascular Disease

## 2017-02-24 DIAGNOSIS — Z23 Encounter for immunization: Secondary | ICD-10-CM | POA: Diagnosis not present

## 2017-03-12 ENCOUNTER — Other Ambulatory Visit: Payer: Self-pay | Admitting: Cardiovascular Disease

## 2017-05-11 ENCOUNTER — Encounter (INDEPENDENT_AMBULATORY_CARE_PROVIDER_SITE_OTHER): Payer: Self-pay | Admitting: Internal Medicine

## 2017-05-13 ENCOUNTER — Other Ambulatory Visit: Payer: Self-pay | Admitting: Cardiovascular Disease

## 2017-05-26 ENCOUNTER — Ambulatory Visit (HOSPITAL_COMMUNITY)
Admission: RE | Admit: 2017-05-26 | Discharge: 2017-05-26 | Disposition: A | Discharge status: Home / Self Care | Payer: Medicare Other | Source: Ambulatory Visit | Attending: Cardiovascular Disease | Admitting: Cardiovascular Disease

## 2017-05-26 DIAGNOSIS — I701 Atherosclerosis of renal artery: Secondary | ICD-10-CM

## 2017-06-02 ENCOUNTER — Encounter (INDEPENDENT_AMBULATORY_CARE_PROVIDER_SITE_OTHER): Payer: Self-pay | Admitting: Internal Medicine

## 2017-06-02 ENCOUNTER — Ambulatory Visit (INDEPENDENT_AMBULATORY_CARE_PROVIDER_SITE_OTHER): Payer: Medicare Other | Admitting: Internal Medicine

## 2017-06-02 VITALS — BP 150/78 | HR 72 | Temp 98.0°F | Ht 71.0 in | Wt 264.9 lb

## 2017-06-02 DIAGNOSIS — K279 Peptic ulcer, site unspecified, unspecified as acute or chronic, without hemorrhage or perforation: Secondary | ICD-10-CM

## 2017-06-02 DIAGNOSIS — I701 Atherosclerosis of renal artery: Secondary | ICD-10-CM | POA: Diagnosis not present

## 2017-06-02 NOTE — Progress Notes (Signed)
Subjective:    Patient ID: Steven Scott, male    DOB: Nov 01, 1945, 71 y.o.   MRN: [id number]  HPI Here today for f/u. Last seen by Dr .Laural Golden in July of 2016. Hx of PUD and anemia.  Underwent an EGD in 2016 for melena and anemia. Maintained on Plavix. Found to have  2 ulcers. He had follow up EGD 5 months later and gastric ulcers had completely healed.  Hx of Renal artery stenosis. His last CBC in April of 2018 and was normal.  He states he feels good.  Weight 2016 was 232. Today his weight is 264.9. Appetite is good. Has a BM daily. No melena or BRRB.  His last colonoscopy was in 2012. He is overdue for a colonoscopy. He had polyps and all were tubular adenoma.  03/15/2015  EGD:    Indications:  Patient is 72 year old Caucasian male who was hospitalized about 4 months ago with upper GI bleed. He was found to have two gastric ulcers. He underwent therapeutic intervention. He has been treated for H. pylori gastritis. He remains on aspirin and Plavix although these aren't hold for the procedure. He is returning for follow-up EGD to document complete healing of these ulcers.                                                                                                                  Mild changes of reflux esophagitis limited to GE junction. Small sliding hiatal hernia. Gastric ulcers have completely healed. Focal gastritis noted in prepyloric region.     EGD 6/29//2016 EGD: upper and lower abdominal pain , 3 day hx of melena and anemia. Maintained on Plavix  Impression: Small sliding hiatal hernia. 3 x 5 mm antral ulcer with clean base. 4 x 55mm antral ulcer with tiny speck of pigment suspected to be source of bleeding but no active bleeding identified. No therapy rendered. Antral gastritis.  Hx of atrial fib and maintained on Plavix.  CBC    Component Value Date/Time   WBC 7.8 09/05/2016 1238   RBC 5.05 09/05/2016 1238   HGB 14.9 09/05/2016 1238   HCT 45.5 09/05/2016 1238     PLT 206 09/05/2016 1238   MCV 90.1 09/05/2016 1238   MCH 29.5 09/05/2016 1238   MCHC 32.7 09/05/2016 1238   RDW 14.9 09/05/2016 1238   LYMPHSABS 1.3 11/07/2014 1258   MONOABS 0.8 11/07/2014 1258   EOSABS 0.3 11/07/2014 1258   BASOSABS 0.1 11/07/2014 1258    Review of Systems . Past Medical History:  Diagnosis Date  . Arthritis   . Atrial fibrillation ([place]) 03/2015  . Carotid artery occlusion   . Coronary artery disease   . GERD (gastroesophageal reflux disease)   . Headache   . History of bleeding ulcers   . Hyperlipidemia   . Hypertension   . Renal artery stenosis ([facility])    Multiple interventions  . Seasonal allergies   . Shortness of breath    "at any time" (12/13/2012)  . Status  post percutaneous angioplasty of renal artery 10/18 2012   PV angiogram preformed revealing 90% right and 70% left in stent restenosis with 36mm and 40mm gradients respectively.    Past Surgical History:  Procedure Laterality Date  . CARDIAC CATHETERIZATION  03/02/2009   has moderate circumflex and diagonal branch disease with high-grade distal right disease and fail attempt at PCI  . COLONOSCOPY  12/18/2010   Procedure: COLONOSCOPY;  Surgeon: Rogene Houston, MD;  [facility];  Service: Endoscopy;  Laterality: N/A;  . CORONARY ANGIOPLASTY WITH STENT PLACEMENT  03/28/2009   Successful PCI od high-grade 95% eccentric tandem plague stenosis in the distal right coronary artery with ultimate insertion of a 3.0 x 18 mm Xience V DES stent postdilated at 3.46 mm with the 99% stenosis being reduced to 0%  . ENDARTERECTOMY Right 09/26/2015   Procedure: RIGHT CAROTID ARTERY ENDARTERECTOMY ;  Surgeon: Elam Dutch, MD;  Location: [facility] OR;  Service: Vascular;  Laterality: Right;  . ESOPHAGOGASTRODUODENOSCOPY N/A 11/08/2014   Procedure: ESOPHAGOGASTRODUODENOSCOPY (EGD);  Surgeon: Rogene Houston, MD;  [facility];  Service: Endoscopy;  Laterality: N/A;  . ESOPHAGOGASTRODUODENOSCOPY N/A  03/15/2015   Procedure: ESOPHAGOGASTRODUODENOSCOPY (EGD);  Surgeon: Rogene Houston, MD;  [facility]: [facility];  Service: Endoscopy;  Laterality: N/A;  1255  . LITHOTRIPSY     "laser" (12/13/2012)  . PATCH ANGIOPLASTY Right 09/26/2015   Procedure: WITH HEMASHIELD PATCH ANGIOPLASTY;  Surgeon: Elam Dutch, MD;  Location: [facility];  Service: Vascular;  Laterality: Right;  . PERCUTANEOUS STENT INTERVENTION Left 12/14/2012   Procedure: PERCUTANEOUS STENT INTERVENTION;  Surgeon: [name], MD;  Location: [facility] CATH LAB;  Service: Cardiovascular;  Laterality: Left;  left renal artery  . PERCUTANEOUS STENT INTERVENTION  02/16/2014   Procedure: PERCUTANEOUS STENT INTERVENTION;  Surgeon: [name], MD;  Location: [facility] CATH LAB;  Service: Cardiovascular;;  left renal  . RENAL ANGIOGRAM N/A 12/14/2012   Procedure: RENAL ANGIOGRAM;  Surgeon: [name], MD;  Location: [facility] CATH LAB;  Service: Cardiovascular;  Laterality: N/A;  . RENAL ANGIOGRAM N/A 02/16/2014   Procedure: RENAL ANGIOGRAM;  Surgeon: [name], MD;  Location: [facility] CATH LAB;  Service: Cardiovascular;  Laterality: N/A;  . RENAL ARTERY ANGIOPLASTY Bilateral 02/2014   PV restenting L RA with an ICast covered stent and cutting balloon atherectomy R RA  . RENAL ARTERY STENT Bilateral 2010    Allergies  Allergen Reactions  . Penicillins Rash    Has patient had a PCN reaction causing immediate rash, facial/tongue/throat swelling, SOB or lightheadedness with hypotension: No Has patient had a PCN reaction causing severe rash involving mucus membranes or skin necrosis: No Has patient had a PCN reaction that required hospitalization No Has patient had a PCN reaction occurring within the last 10 years: No If all of the above answers are "NO", then may proceed with Cephalosporin use.    Current Outpatient Medications on File Prior to Visit  Medication Sig Dispense Refill  . acetaminophen (TYLENOL) 500 MG tablet Take 500 mg by mouth  every 6 (six) hours as needed for moderate pain.    Marland Kitchen apixaban (ELIQUIS) 5 MG TABS tablet Take 1 tablet (5 mg total) by mouth 2 (two) times daily. 60 tablet 5  . aspirin EC 81 MG tablet Take 1 tablet (81 mg total) by mouth daily. Start on 11/15/14 30 tablet 1  . BYSTOLIC 5 MG tablet TAKE 1 TABLET BY MOUTH DAILY WITH BREAKFAST. 30 tablet  6  . cholecalciferol (VITAMIN D) 1000 UNITS tablet Take 1,000 Units by mouth daily.    . clobetasol cream (TEMOVATE) 7.62 % Apply 1 application topically daily as needed (Psoriasis).    . diphenhydrAMINE (BENADRYL) 25 MG tablet Take 12.5 mg by mouth every 6 (six) hours as needed for allergies.    . Olmesartan-Amlodipine-HCTZ 40-10-25 MG TABS TAKE 1 TABLET BY MOUTH DAILY WITH BREAKFAST. 30 tablet 0  . pantoprazole (PROTONIX) 40 MG tablet Take 1 tablet (40 mg total) by mouth daily. 30 tablet 11  . simvastatin (ZOCOR) 20 MG tablet TAKE (1) TABLET BY MOUTH AT BEDTIME. 30 tablet 0   No current facility-administered medications on file prior to visit.         Objective:   Physical Exam Blood pressure (!) 150/78, pulse 72, temperature 98 F (36.7 C), height 5\' 11"  (1.803 m), weight 264 lb 14.4 oz (120.2 kg). Alert and oriented. Skin warm and dry. Oral mucosa is moist.   . Sclera anicteric, conjunctivae is pink. Thyroid not enlarged. No cervical lymphadenopathy. Lungs clear. Heart regular rate and rhythm.  Abdomen is soft. Bowel sounds are positive. No hepatomegaly. No abdominal masses felt. No tenderness.  1+ edema to lower extremities.           Assessment & Plan:  PUD. Continue the Protonix.  Colon polyps. He will call and set colonoscopy up.  OV in 1 year.

## 2017-06-02 NOTE — Patient Instructions (Signed)
OV in 1 year. Continue the Protonix 

## 2017-06-09 ENCOUNTER — Ambulatory Visit (INDEPENDENT_AMBULATORY_CARE_PROVIDER_SITE_OTHER): Payer: Medicare Other | Admitting: Cardiovascular Disease

## 2017-06-09 ENCOUNTER — Encounter: Payer: Self-pay | Admitting: Cardiovascular Disease

## 2017-06-09 VITALS — BP 140/68 | HR 94 | Ht 71.0 in | Wt 268.0 lb

## 2017-06-09 DIAGNOSIS — I2510 Atherosclerotic heart disease of native coronary artery without angina pectoris: Secondary | ICD-10-CM

## 2017-06-09 DIAGNOSIS — I10 Essential (primary) hypertension: Secondary | ICD-10-CM | POA: Diagnosis not present

## 2017-06-09 DIAGNOSIS — R809 Proteinuria, unspecified: Secondary | ICD-10-CM | POA: Diagnosis not present

## 2017-06-09 DIAGNOSIS — D509 Iron deficiency anemia, unspecified: Secondary | ICD-10-CM | POA: Diagnosis not present

## 2017-06-09 DIAGNOSIS — I6521 Occlusion and stenosis of right carotid artery: Secondary | ICD-10-CM

## 2017-06-09 DIAGNOSIS — Z79899 Other long term (current) drug therapy: Secondary | ICD-10-CM | POA: Diagnosis not present

## 2017-06-09 DIAGNOSIS — I482 Chronic atrial fibrillation, unspecified: Secondary | ICD-10-CM

## 2017-06-09 DIAGNOSIS — I701 Atherosclerosis of renal artery: Secondary | ICD-10-CM | POA: Diagnosis not present

## 2017-06-09 DIAGNOSIS — E7800 Pure hypercholesterolemia, unspecified: Secondary | ICD-10-CM

## 2017-06-09 DIAGNOSIS — Z9861 Coronary angioplasty status: Secondary | ICD-10-CM | POA: Diagnosis not present

## 2017-06-09 DIAGNOSIS — I129 Hypertensive chronic kidney disease with stage 1 through stage 4 chronic kidney disease, or unspecified chronic kidney disease: Secondary | ICD-10-CM | POA: Diagnosis not present

## 2017-06-09 DIAGNOSIS — E559 Vitamin D deficiency, unspecified: Secondary | ICD-10-CM | POA: Diagnosis not present

## 2017-06-09 DIAGNOSIS — N183 Chronic kidney disease, stage 3 (moderate): Secondary | ICD-10-CM | POA: Diagnosis not present

## 2017-06-09 NOTE — Assessment & Plan Note (Signed)
History of bilateral renal artery stenosis and stenting with subsequent occlusion of his right renal artery. His most recent renal Doppler studies performed 05/26/17 revealed a patent left renal artery stent with a left renal dimension of 12 cm which is stable. We'll continue to follow this on a semiannual basis.

## 2017-06-09 NOTE — Assessment & Plan Note (Signed)
History of chronic atrial fibrillation rate controlled on Eliquis oral anticoagulation. ?

## 2017-06-09 NOTE — Assessment & Plan Note (Signed)
History of carotid artery disease status post elective right carotid endarterectomy performed by Dr. [name] 09/26/15 which he follows noninvasively.

## 2017-06-09 NOTE — Patient Instructions (Signed)

## 2017-06-09 NOTE — Assessment & Plan Note (Signed)
History of essential hypertension blood pressures measured 140/68. He is on Bystolic , olmesartan and amlodipine. Continue current meds at current dosing.

## 2017-06-09 NOTE — Assessment & Plan Note (Signed)
History of hyperlipidemia on statin therapy lipid profile performed 12/24/78 and LDL 51 and HDL of 36

## 2017-06-09 NOTE — Addendum Note (Signed)
Addended by: Therisa Doyne on: 06/09/2017 10:56 AM   Modules accepted: Orders

## 2017-06-09 NOTE — Progress Notes (Signed)
06/09/2017 Steven Scott   1945-10-04  [id number]  Primary Physician Redmond School, MD Primary Cardiologist: [name] MD FACP, [place], [place], [name]  HPI:  Steven Scott is a 72 y.o.  moderately overweight, married Caucasian male, father of 2 who I last saw in the office  11/05/16. He has a history of persistent hypertension status post bilateral renal artery PTA and stenting with a positive Myoview, as well as heart catheterization that showed a high-grade distal RCA stenosis. I stented both his renal arteries successfully but was unable to cross his distal RCA, which Dr. Claiborne Billings subsequently performed successfully on March 28, 2008, with a Xience V drug-eluting stent with an excellent result. He clinically improved. He had an excellent lipid profile. Renal Dopplers performed January 31, 2011, showed progression of disease in both renal arteries suggesting high "in-stent restenosis." I performed angiography on him October 18 revealing 90% right and 70% left in-stent restenosis with 60 and 40-mm gradients, respectively. He underwent AngioSculpt cutting balloon atherectomy of both renal arteries with an excellent angiographic and followup Doppler result. His blood pressure has remained stable. He is otherwise asymptomatic. He was changed to generic antihypertensive medications off of Tribenzor and apparently his blood pressures have been more difficult to control since that time. He does admit to dietary indiscretion with regards to salt as well.  I saw Steven Scott 04/19/12. He denies chest pain or shortness of breath. Apparently his creatinine has slowly increased now to close to 2. Recent renal Dopplers performed in our office 11/19/12 suggest rapid progression of the in-stent restenosis and throat within the left renal artery stent with a renal/aortic ratio of 7.33. Based on this, I re-angiogram him on 12/14/12 revealing an 80-90% mid in-stent restenosis" within the left renal artery stent  which I restented with a ICast covered stent. His right renal artery had 60-70% ostial stenosis at that time. I performed cutting balloon angioplasty on the right renal artery in-stent restenosis.Followup Dopplers performed last month revealed normalization of his left renal artery to aortic ratio with mild progression of disease on the right. Since I saw him in the office 6 months ago he's done well . Renal Dopplers that suggest patent renal stents although his renal dimensions have mildly decreased. Because of high-grade right ICA stenosis I referred him to Dr. [name] performed elective endarterectomy on 09/26/15. He had minimal disease on the left. A Myoview stress test on prior to that was nonischemic . since I saw him back 6 months ago he remained clinically stable. He denies chest pain or shortness of breath.    Current Meds  Medication Sig  . acetaminophen (TYLENOL) 500 MG tablet Take 500 mg by mouth every 6 (six) hours as needed for moderate pain.  Marland Kitchen apixaban (ELIQUIS) 5 MG TABS tablet Take 1 tablet (5 mg total) by mouth 2 (two) times daily.  Marland Kitchen aspirin EC 81 MG tablet Take 1 tablet (81 mg total) by mouth daily. Start on 11/15/14  . BYSTOLIC 5 MG tablet TAKE 1 TABLET BY MOUTH DAILY WITH BREAKFAST.  . cholecalciferol (VITAMIN D) 1000 UNITS tablet Take 1,000 Units by mouth daily.  . clobetasol cream (TEMOVATE) 6.71 % Apply 1 application topically daily as needed (Psoriasis).  . diphenhydrAMINE (BENADRYL) 25 MG tablet Take 12.5 mg by mouth every 6 (six) hours as needed for allergies.  . Olmesartan-Amlodipine-HCTZ 40-10-25 MG TABS TAKE 1 TABLET BY MOUTH DAILY WITH BREAKFAST.  . pantoprazole (PROTONIX) 40 MG tablet Take 1 tablet (  40 mg total) by mouth daily.  . simvastatin (ZOCOR) 20 MG tablet TAKE (1) TABLET BY MOUTH AT BEDTIME.     Allergies  Allergen Reactions  . Penicillins Rash    Has patient had a PCN reaction causing immediate rash, facial/tongue/throat swelling, SOB or lightheadedness  with hypotension: No Has patient had a PCN reaction causing severe rash involving mucus membranes or skin necrosis: No Has patient had a PCN reaction that required hospitalization No Has patient had a PCN reaction occurring within the last 10 years: No If all of the above answers are "NO", then may proceed with Cephalosporin use.    Social History   Socioeconomic History  . Marital status: Married    Spouse name: Not on file  . Number of children: Not on file  . Years of education: Not on file  . Highest education level: Not on file  Social Needs  . Financial resource strain: Not on file  . Food insecurity - worry: Not on file  . Food insecurity - inability: Not on file  . Transportation needs - medical: Not on file  . Transportation needs - non-medical: Not on file  Occupational History  . Not on file  Tobacco Use  . Smoking status: Former Smoker    Packs/day: 2.00    Years: 50.00    Pack years: 100.00    Types: Cigarettes    Last attempt to quit: 02/24/2011    Years since quitting: 6.2  . Smokeless tobacco: Never Used  Substance and Sexual Activity  . Alcohol use: No    Alcohol/week: 0.0 oz    Comment: 12/13/2012 "last alcohol was ~ 4 yr ago"  . Drug use: No  . Sexual activity: Not Currently  Other Topics Concern  . Not on file  Social History Narrative  . Not on file     Review of Systems: General: negative for chills, fever, night sweats or weight changes.  Cardiovascular: negative for chest pain, dyspnea on exertion, edema, orthopnea, palpitations, paroxysmal nocturnal dyspnea or shortness of breath Dermatological: negative for rash Respiratory: negative for cough or wheezing Urologic: negative for hematuria Abdominal: negative for nausea, vomiting, diarrhea, bright red blood per rectum, melena, or hematemesis Neurologic: negative for visual changes, syncope, or dizziness All other systems reviewed and are otherwise negative except as noted above.    Blood  pressure 140/68, pulse 94, height 5\' 11"  (1.803 m), weight 268 lb (121.6 kg).  General appearance: alert and no distress Neck: no adenopathy, no carotid bruit, no JVD, supple, symmetrical, trachea midline and thyroid not enlarged, symmetric, no tenderness/mass/nodules Lungs: clear to auscultation bilaterally Heart: irregularly irregular rhythm Extremities: extremities normal, atraumatic, no cyanosis or edema Pulses: 2+ and symmetric Skin: Skin color, texture, turgor normal. No rashes or lesions Neurologic: Alert and oriented X 3, normal strength and tone. Normal symmetric reflexes. Normal coordination and gait  EKG atrial fibrillation with a ventricular response of 94 and nonspecific ST and T-wave changes with septal Q waves. I personally reviewed this EKG.  ASSESSMENT AND PLAN:   Hyperlipidemia History of hyperlipidemia on statin therapy lipid profile performed 12/24/78 and LDL 51 and HDL of 36  CAD -S/P RCA DES 2010 History of CAD status post high-grade distal RCA stenosis stented by Dr. Claiborne Billings 03/28/08 with a science drug-eluting stent with excellent result.He had a Myoview stress test performed 09/20/15 which was low risk and nonischemic. He denies chest pain.  Essential hypertension History of essential hypertension blood pressures measured 140/68. He is on  Bystolic , olmesartan and amlodipine. Continue current meds at current dosing.  Renal artery stenosis History of bilateral renal artery stenosis and stenting with subsequent occlusion of his right renal artery. His most recent renal Doppler studies performed 05/26/17 revealed a patent left renal artery stent with a left renal dimension of 12 cm which is stable. We'll continue to follow this on a semiannual basis.  Asymptomatic stenosis of right carotid artery History of carotid artery disease status post elective right carotid endarterectomy performed by Dr. [name] 09/26/15 which he follows noninvasively.  Chronic atrial fibrillation  (HCC) History of chronic atrial fibrillation rate controlled on Eliquis oral anticoagulation.      [name] MD FACP,FACC,FAHA, [facility] 06/09/2017 10:53 AM

## 2017-06-09 NOTE — Assessment & Plan Note (Signed)
History of CAD status post high-grade distal RCA stenosis stented by Dr. Claiborne Billings 03/28/08 with a science drug-eluting stent with excellent result.He had a Myoview stress test performed 09/20/15 which was low risk and nonischemic. He denies chest pain.

## 2017-06-12 ENCOUNTER — Other Ambulatory Visit: Payer: Self-pay | Admitting: Cardiovascular Disease

## 2017-06-12 ENCOUNTER — Other Ambulatory Visit (INDEPENDENT_AMBULATORY_CARE_PROVIDER_SITE_OTHER): Payer: Self-pay | Admitting: Internal Medicine

## 2017-06-12 DIAGNOSIS — K279 Peptic ulcer, site unspecified, unspecified as acute or chronic, without hemorrhage or perforation: Secondary | ICD-10-CM

## 2017-06-12 NOTE — Telephone Encounter (Signed)
REFILL 

## 2017-06-16 DIAGNOSIS — I10 Essential (primary) hypertension: Secondary | ICD-10-CM | POA: Diagnosis not present

## 2017-06-16 DIAGNOSIS — D638 Anemia in other chronic diseases classified elsewhere: Secondary | ICD-10-CM | POA: Diagnosis not present

## 2017-06-16 DIAGNOSIS — R801 Persistent proteinuria, unspecified: Secondary | ICD-10-CM | POA: Diagnosis not present

## 2017-06-16 DIAGNOSIS — N183 Chronic kidney disease, stage 3 (moderate): Secondary | ICD-10-CM | POA: Diagnosis not present

## 2017-07-10 ENCOUNTER — Other Ambulatory Visit: Payer: Self-pay | Admitting: Cardiovascular Disease

## 2017-07-10 DIAGNOSIS — I482 Chronic atrial fibrillation, unspecified: Secondary | ICD-10-CM

## 2017-08-20 ENCOUNTER — Telehealth: Payer: Self-pay | Admitting: Cardiovascular Disease

## 2017-08-20 NOTE — Telephone Encounter (Signed)
Returned call to patient, advised to disregard recall letter.  Due for OV 05/2018.    Patient also states he is filling out Eliquis patient assistance forms and needs Dr. Gwenlyn Found to fill out the provider portion.  Advised to drop this off at office for [place] to complete this portion.    Routed to primary CMA to make aware/follow up.

## 2017-08-20 NOTE — Telephone Encounter (Signed)
New Message  Pt calling because he received a recall letter for a 12 month follow up for June, but pt last seen Berry in jan 2019 and he wants to know if he really needs this appt. Please call

## 2017-10-12 ENCOUNTER — Other Ambulatory Visit: Payer: Self-pay | Admitting: Cardiovascular Disease

## 2017-10-12 DIAGNOSIS — Z79899 Other long term (current) drug therapy: Secondary | ICD-10-CM | POA: Diagnosis not present

## 2017-10-12 DIAGNOSIS — N183 Chronic kidney disease, stage 3 (moderate): Secondary | ICD-10-CM | POA: Diagnosis not present

## 2017-10-12 DIAGNOSIS — D509 Iron deficiency anemia, unspecified: Secondary | ICD-10-CM | POA: Diagnosis not present

## 2017-10-12 DIAGNOSIS — E559 Vitamin D deficiency, unspecified: Secondary | ICD-10-CM | POA: Diagnosis not present

## 2017-10-12 DIAGNOSIS — R809 Proteinuria, unspecified: Secondary | ICD-10-CM | POA: Diagnosis not present

## 2017-10-12 DIAGNOSIS — I129 Hypertensive chronic kidney disease with stage 1 through stage 4 chronic kidney disease, or unspecified chronic kidney disease: Secondary | ICD-10-CM | POA: Diagnosis not present

## 2017-10-13 DIAGNOSIS — N183 Chronic kidney disease, stage 3 (moderate): Secondary | ICD-10-CM | POA: Diagnosis not present

## 2017-10-13 DIAGNOSIS — R809 Proteinuria, unspecified: Secondary | ICD-10-CM | POA: Diagnosis not present

## 2017-10-13 DIAGNOSIS — I10 Essential (primary) hypertension: Secondary | ICD-10-CM | POA: Diagnosis not present

## 2017-10-13 DIAGNOSIS — I701 Atherosclerosis of renal artery: Secondary | ICD-10-CM | POA: Diagnosis not present

## 2017-10-13 NOTE — Telephone Encounter (Signed)
Rx sent to pharmacy   

## 2017-11-09 ENCOUNTER — Other Ambulatory Visit: Payer: Self-pay | Admitting: Cardiovascular Disease

## 2017-11-09 DIAGNOSIS — I482 Chronic atrial fibrillation, unspecified: Secondary | ICD-10-CM

## 2017-11-09 NOTE — Telephone Encounter (Signed)
Rx(s) sent to pharmacy electronically.  

## 2017-11-19 DIAGNOSIS — Z0001 Encounter for general adult medical examination with abnormal findings: Secondary | ICD-10-CM | POA: Diagnosis not present

## 2017-11-19 DIAGNOSIS — Z1389 Encounter for screening for other disorder: Secondary | ICD-10-CM | POA: Diagnosis not present

## 2017-11-19 DIAGNOSIS — I10 Essential (primary) hypertension: Secondary | ICD-10-CM | POA: Diagnosis not present

## 2017-12-07 ENCOUNTER — Other Ambulatory Visit: Payer: Self-pay

## 2017-12-07 DIAGNOSIS — I6523 Occlusion and stenosis of bilateral carotid arteries: Secondary | ICD-10-CM

## 2017-12-29 ENCOUNTER — Encounter (HOSPITAL_COMMUNITY): Payer: Medicare Other

## 2017-12-31 ENCOUNTER — Ambulatory Visit (HOSPITAL_COMMUNITY)
Admission: RE | Admit: 2017-12-31 | Discharge: 2017-12-31 | Disposition: A | Discharge status: Home / Self Care | Payer: Medicare Other | Source: Ambulatory Visit | Attending: Cardiology | Admitting: Cardiology

## 2017-12-31 DIAGNOSIS — I701 Atherosclerosis of renal artery: Secondary | ICD-10-CM | POA: Diagnosis not present

## 2018-01-01 ENCOUNTER — Telehealth: Payer: Self-pay | Admitting: *Deleted

## 2018-01-01 NOTE — Telephone Encounter (Signed)
Pt returned call he stated he would like it mailed to him please.

## 2018-01-01 NOTE — Telephone Encounter (Signed)
Returned call to patient advised I will mail patient assistance form to you.

## 2018-01-01 NOTE — Telephone Encounter (Signed)
Left message for pt to call, eliquis paperwork complete. ? Does he want to pick it up or have it mailed to him?

## 2018-01-04 ENCOUNTER — Other Ambulatory Visit: Payer: Self-pay | Admitting: *Deleted

## 2018-01-04 DIAGNOSIS — I701 Atherosclerosis of renal artery: Secondary | ICD-10-CM

## 2018-01-08 ENCOUNTER — Ambulatory Visit: Payer: Medicare Other | Admitting: Family

## 2018-01-08 ENCOUNTER — Other Ambulatory Visit (INDEPENDENT_AMBULATORY_CARE_PROVIDER_SITE_OTHER): Payer: Self-pay | Admitting: Internal Medicine

## 2018-01-08 ENCOUNTER — Encounter (HOSPITAL_COMMUNITY): Payer: Medicare Other

## 2018-01-08 ENCOUNTER — Other Ambulatory Visit: Payer: Self-pay | Admitting: Cardiovascular Disease

## 2018-01-08 DIAGNOSIS — I482 Chronic atrial fibrillation, unspecified: Secondary | ICD-10-CM

## 2018-01-08 DIAGNOSIS — K279 Peptic ulcer, site unspecified, unspecified as acute or chronic, without hemorrhage or perforation: Secondary | ICD-10-CM

## 2018-01-19 ENCOUNTER — Telehealth: Payer: Self-pay

## 2018-01-19 DIAGNOSIS — L570 Actinic keratosis: Secondary | ICD-10-CM | POA: Diagnosis not present

## 2018-01-19 DIAGNOSIS — L400 Psoriasis vulgaris: Secondary | ICD-10-CM | POA: Diagnosis not present

## 2018-01-19 NOTE — Telephone Encounter (Signed)
The patient's patient assistance form has been received by [facility] for Eliquis they are processing the request and have a response in 2 business days.  Customer service [phone] Application case # [id number]

## 2018-02-02 NOTE — Telephone Encounter (Signed)
Patient application for assistance denied because the medication is covered by the patients insurance. Patient made aware.

## 2018-02-04 ENCOUNTER — Encounter (HOSPITAL_COMMUNITY): Payer: Medicare Other

## 2018-02-04 ENCOUNTER — Ambulatory Visit: Payer: Medicare Other | Admitting: Family

## 2018-02-12 DIAGNOSIS — D649 Anemia, unspecified: Secondary | ICD-10-CM | POA: Diagnosis not present

## 2018-02-12 DIAGNOSIS — E559 Vitamin D deficiency, unspecified: Secondary | ICD-10-CM | POA: Diagnosis not present

## 2018-02-12 DIAGNOSIS — N183 Chronic kidney disease, stage 3 (moderate): Secondary | ICD-10-CM | POA: Diagnosis not present

## 2018-02-12 DIAGNOSIS — I129 Hypertensive chronic kidney disease with stage 1 through stage 4 chronic kidney disease, or unspecified chronic kidney disease: Secondary | ICD-10-CM | POA: Diagnosis not present

## 2018-02-12 DIAGNOSIS — Z23 Encounter for immunization: Secondary | ICD-10-CM | POA: Diagnosis not present

## 2018-02-12 DIAGNOSIS — R809 Proteinuria, unspecified: Secondary | ICD-10-CM | POA: Diagnosis not present

## 2018-02-12 DIAGNOSIS — Z79899 Other long term (current) drug therapy: Secondary | ICD-10-CM | POA: Diagnosis not present

## 2018-02-15 ENCOUNTER — Ambulatory Visit (HOSPITAL_COMMUNITY)
Admission: RE | Admit: 2018-02-15 | Discharge: 2018-02-15 | Disposition: A | Discharge status: Home / Self Care | Payer: Medicare Other | Source: Ambulatory Visit | Attending: Vascular Surgery | Admitting: Vascular Surgery

## 2018-02-15 ENCOUNTER — Other Ambulatory Visit: Payer: Self-pay

## 2018-02-15 ENCOUNTER — Encounter: Payer: Self-pay | Admitting: Family

## 2018-02-15 ENCOUNTER — Ambulatory Visit (INDEPENDENT_AMBULATORY_CARE_PROVIDER_SITE_OTHER): Payer: Medicare Other | Admitting: Family

## 2018-02-15 VITALS — BP 139/75 | HR 69 | Temp 97.8°F | Resp 18 | Ht 71.0 in | Wt 258.0 lb

## 2018-02-15 DIAGNOSIS — I6523 Occlusion and stenosis of bilateral carotid arteries: Secondary | ICD-10-CM

## 2018-02-15 DIAGNOSIS — I701 Atherosclerosis of renal artery: Secondary | ICD-10-CM

## 2018-02-15 DIAGNOSIS — Z9889 Other specified postprocedural states: Secondary | ICD-10-CM

## 2018-02-15 DIAGNOSIS — Z87891 Personal history of nicotine dependence: Secondary | ICD-10-CM | POA: Diagnosis not present

## 2018-02-15 DIAGNOSIS — R0989 Other specified symptoms and signs involving the circulatory and respiratory systems: Secondary | ICD-10-CM

## 2018-02-15 DIAGNOSIS — R062 Wheezing: Secondary | ICD-10-CM

## 2018-02-15 NOTE — Patient Instructions (Signed)

## 2018-02-15 NOTE — Progress Notes (Signed)
Chief Complaint: Follow up Extracranial Carotid Artery Stenosis   History of Present Illness  Steven Scott is a 72 y.o. male who returns for follow-up today. He underwent right carotid endarterectomy on 09/26/15 by Dr. Oneida Alar.   He denies any known history of stroke or TIA. Specifically he deniesa history of amaurosis fugax or monocular blindness, unilateral facial drooping, hemiplegia, orreceptive or expressive aphasia.  The swelling in the right side of his neck has resolved. He has no swallowing difficulties.  He has a history of BPH as well as some mild renal dysfunction.  Pt states he sees a urologist yearly for what sounds like BPH.   Dr. Oneida Alar last evaluated pt on 06-05-16. At that time his right side of neck scar still had some peri-incisional numbness, no carotid bruit. Symmetric upper and lower extremity motor strength 5 over 5, tongue midline. Carotid duplex exam performed at [facility] September 2017 showed no significant stenosis on the right side, ess than 50% stenosis on the left side. Doing well post carotid endarterectomy.  The patient was to follow-up withour nurse practitioner in 6 months time for repeat carotid duplex exam.   Dr. Gwenlyn Found has been monitoring pt renal arteries; pt has bilateral renal artery stents, also states his right kidney has stopped functioning.   He denies pain or weakness in his legs with walking, states he has cramps in his calves.   Last serum creatinine result on file was 2.22 on 09-05-16 form BMP, no GFR recent. 09-26-15 GFR was 33, stage 3b CKD at that time.   He states his dyspnea is no worse than usual.   Diabetic: no Tobacco use: former smoker, quit in 2012, started at age 56, states he smoked for over 50 years   Pt meds include: Statin : yes ASA: yes Other anticoagulants/antiplatelets: Eliquis, pt has atrial fib   Past Medical History:  Diagnosis Date  . Arthritis   . Atrial fibrillation ([place]) 03/2015   . Carotid artery occlusion   . Coronary artery disease   . GERD (gastroesophageal reflux disease)   . Headache   . History of bleeding ulcers   . Hyperlipidemia   . Hypertension   . Renal artery stenosis (H[facility])    Multiple interventions  . Seasonal allergies   . Shortness of breath    "at any time" (12/13/2012)  . Status post percutaneous angioplasty of renal artery 10/18 2012   PV angiogram preformed revealing 90% right and 70% left in stent restenosis with 51mm and 51mm gradients respectively.    Social History Social History   Tobacco Use  . Smoking status: Former Smoker    Packs/day: 2.00    Years: 50.00    Pack years: 100.00    Types: Cigarettes    Last attempt to quit: 02/24/2011    Years since quitting: 6.9  . Smokeless tobacco: Never Used  Substance Use Topics  . Alcohol use: No    Alcohol/week: 0.0 standard drinks    Comment: 12/13/2012 "last alcohol was ~ 4 yr ago"  . Drug use: No    Family History Family History  Problem Relation Age of Onset  . COPD Father 61       deceased    Surgical History Past Surgical History:  Procedure Laterality Date  . CARDIAC CATHETERIZATION  03/02/2009   has moderate circumflex and diagonal branch disease with high-grade distal right disease and fail attempt at PCI  . COLONOSCOPY  12/18/2010   Procedure: COLONOSCOPY;  Surgeon:  Rogene Houston, MD;  [facility];  Service: Endoscopy;  Laterality: N/A;  . CORONARY ANGIOPLASTY WITH STENT PLACEMENT  03/28/2009   Successful PCI od high-grade 95% eccentric tandem plague stenosis in the distal right coronary artery with ultimate insertion of a 3.0 x 18 mm Xience V DES stent postdilated at 3.46 mm with the 99% stenosis being reduced to 0%  . ENDARTERECTOMY Right 09/26/2015   Procedure: RIGHT CAROTID ARTERY ENDARTERECTOMY ;  Surgeon: Elam Dutch, MD;  Location: [facility] OR;  Service: Vascular;  Laterality: Right;  . ESOPHAGOGASTRODUODENOSCOPY N/A 11/08/2014   Procedure:  ESOPHAGOGASTRODUODENOSCOPY (EGD);  Surgeon: Rogene Houston, MD;  [facility]: [facility];  Service: Endoscopy;  Laterality: N/A;  . ESOPHAGOGASTRODUODENOSCOPY N/A 03/15/2015   Procedure: ESOPHAGOGASTRODUODENOSCOPY (EGD);  Surgeon: Rogene Houston, MD;  Location: [facility];  Service: Endoscopy;  Laterality: N/A;  1255  . LITHOTRIPSY     "laser" (12/13/2012)  . PATCH ANGIOPLASTY Right 09/26/2015   Procedure: WITH HEMASHIELD PATCH ANGIOPLASTY;  Surgeon: Elam Dutch, MD;  Location: [place];  Service: Vascular;  Laterality: Right;  . PERCUTANEOUS STENT INTERVENTION Left 12/14/2012   Procedure: PERCUTANEOUS STENT INTERVENTION;  Surgeon: [name], MD;  Location: [facility] CATH LAB;  Service: Cardiovascular;  Laterality: Left;  left renal artery  . PERCUTANEOUS STENT INTERVENTION  02/16/2014   Procedure: PERCUTANEOUS STENT INTERVENTION;  Surgeon: [name], MD;  Location: [facility] CATH LAB;  Service: Cardiovascular;;  left renal  . RENAL ANGIOGRAM N/A 12/14/2012   Procedure: RENAL ANGIOGRAM;  Surgeon: [name], MD;  Location: [facility] CATH LAB;  Service: Cardiovascular;  Laterality: N/A;  . RENAL ANGIOGRAM N/A 02/16/2014   Procedure: RENAL ANGIOGRAM;  Surgeon: [name], MD;  Location: [facility] CATH LAB;  Service: Cardiovascular;  Laterality: N/A;  . RENAL ARTERY ANGIOPLASTY Bilateral 02/2014   PV restenting L RA with an ICast covered stent and cutting balloon atherectomy R RA  . RENAL ARTERY STENT Bilateral 2010    Allergies  Allergen Reactions  . Penicillins Rash    Has patient had a PCN reaction causing immediate rash, facial/tongue/throat swelling, SOB or lightheadedness with hypotension: No Has patient had a PCN reaction causing severe rash involving mucus membranes or skin necrosis: No Has patient had a PCN reaction that required hospitalization No Has patient had a PCN reaction occurring within the last 10 years: No If all of the above answers are "NO", then may proceed with  Cephalosporin use.    Current Outpatient Medications  Medication Sig Dispense Refill  . acetaminophen (TYLENOL) 500 MG tablet Take 500 mg by mouth every 6 (six) hours as needed for moderate pain.    Marland Kitchen aspirin EC 81 MG tablet Take 1 tablet (81 mg total) by mouth daily. Start on 11/15/14 30 tablet 1  . cholecalciferol (VITAMIN D) 1000 UNITS tablet Take 1,000 Units by mouth daily.    . clobetasol cream (TEMOVATE) 8.75 % Apply 1 application topically daily as needed (Psoriasis).    . diphenhydrAMINE (BENADRYL) 25 MG tablet Take 12.5 mg by mouth every 6 (six) hours as needed for allergies.    Marland Kitchen ELIQUIS 5 MG TABS tablet TAKE (1) TABLET BY MOUTH TWICE DAILY. 60 tablet 0  . nebivolol (BYSTOLIC) 5 MG tablet Take 1 tablet (5 mg total) by mouth daily with breakfast. 30 tablet 6  . Olmesartan-Amlodipine-HCTZ 40-10-25 MG TABS TAKE 1 TABLET BY MOUTH DAILY WITH BREAKFAST. 30 tablet 11  . pantoprazole (PROTONIX) 40 MG tablet TAKE (  1) TABLET BY MOUTH ONCE DAILY. 30 tablet 5  . simvastatin (ZOCOR) 20 MG tablet TAKE (1) TABLET BY MOUTH AT BEDTIME. 30 tablet 11   No current facility-administered medications for this visit.     Review of Systems : See HPI for pertinent positives and negatives.  Physical Examination  Vitals:   02/15/18 1158 02/15/18 1201  BP: 140/70 139/75  Pulse: 74 69  Resp: 18   Temp: 97.8 F (36.6 C)   TempSrc: Oral   SpO2: 95%   Weight: 258 lb (117 kg)   Height: 5\' 11"  (1.803 m)    Body mass index is 35.98 kg/m.  General: WDWN obese male in NAD GAIT: normal Eyes: PERRLA HENT: No gross abnormalities.  Pulmonary:  Respirations are non-labored at rest and moist sounding, limited air movement in all fields, rales and rhonchi in all fields, + wheezes in all fields.   Cardiac: regular rhythm, no detected murmur.  VASCULAR EXAM Carotid Bruits Right Left   positive Positive     Abdominal aortic pulse is bot palpable. Radial pulses are 2+ palpable and equal.                                                                                                                             LE Pulses Right Left       POPLITEAL  not palpable   not palpable       POSTERIOR TIBIAL  not palpable   not palpable        DORSALIS PEDIS      ANTERIOR TIBIAL not palpable  not palpable     Gastrointestinal: soft, nontender, BS WNL, no r/g, no palpable masses. Musculoskeletal: no muscle atrophy/wasting. M/S 5/5 throughout, extremities without ischemic changes. No lower extremity edema.  Skin: No rashes, no ulcers, no cellulitis.   Neurologic:  A&O X 3; appropriate affect, sensation is normal; speech is normal, CN 2-12 intact except has some hearing loss, pain and light touch intact in extremities, motor exam as listed above. Psychiatric: Normal thought content, mood appropriate to clinical situation.    Assessment: Steven Scott is a 72 y.o. male who is s/p right carotid endarterectomy on 09/26/15.  He has no history of stroke or TIA.   Fortunately he does not have DM and quit smoking in 2012. He takes a daily 81 mg ASA, a statin, and Eliquis for atrial fib, managed by his cardiologist Dr. Gwenlyn Found.    - Rales, rhonchi, and wheezes in all lung fields with no worse than usual dyspnea, no dyspnea at rest, no chest pain. I offered to refer pt to a pulmonologist, or he may discuss with his PCP; states he will let me know.  He has no edema in his lower extremities.    DATA Carotid Duplex (02-15-18): Right ICA: CEA site with 1-39% stenosis Left ICA: 1-39% stenosis Bilateral vertebral artery flow is antegrade.  Bilateral subclavian artery waveforms are normal.  Slight increase in  stenosis of the right ICA compared to the exam on 12-18-16.    Plan: Follow-up in 1 year with Carotid Duplex scan.  I discussed in depth with the patient the nature of atherosclerosis, and emphasized the importance of maximal medical management including strict control of blood pressure, blood glucose,  and lipid levels, obtaining regular exercise, and continued cessation of smoking.  The patient is aware that without maximal medical management the underlying atherosclerotic disease process will progress, limiting the benefit of any interventions. The patient was given information about stroke prevention and what symptoms should prompt the patient to seek immediate medical care. Thank you for allowing [place] to participate in this patient's care.  Clemon Chambers, RN, MSN, FNP-C [facility] Office: [phone]  Clinic Physician: [name]  02/15/18 12:33 PM

## 2018-02-23 DIAGNOSIS — N183 Chronic kidney disease, stage 3 (moderate): Secondary | ICD-10-CM | POA: Diagnosis not present

## 2018-02-23 DIAGNOSIS — I701 Atherosclerosis of renal artery: Secondary | ICD-10-CM | POA: Diagnosis not present

## 2018-02-23 DIAGNOSIS — R809 Proteinuria, unspecified: Secondary | ICD-10-CM | POA: Diagnosis not present

## 2018-02-23 DIAGNOSIS — I10 Essential (primary) hypertension: Secondary | ICD-10-CM | POA: Diagnosis not present

## 2018-05-21 ENCOUNTER — Ambulatory Visit (INDEPENDENT_AMBULATORY_CARE_PROVIDER_SITE_OTHER): Payer: Medicare Other | Admitting: Cardiovascular Disease

## 2018-05-21 ENCOUNTER — Encounter: Payer: Self-pay | Admitting: Cardiovascular Disease

## 2018-05-21 ENCOUNTER — Encounter (INDEPENDENT_AMBULATORY_CARE_PROVIDER_SITE_OTHER): Payer: Self-pay

## 2018-05-21 DIAGNOSIS — Z9861 Coronary angioplasty status: Secondary | ICD-10-CM

## 2018-05-21 DIAGNOSIS — I2510 Atherosclerotic heart disease of native coronary artery without angina pectoris: Secondary | ICD-10-CM

## 2018-05-21 DIAGNOSIS — E7800 Pure hypercholesterolemia, unspecified: Secondary | ICD-10-CM

## 2018-05-21 DIAGNOSIS — I6523 Occlusion and stenosis of bilateral carotid arteries: Secondary | ICD-10-CM | POA: Diagnosis not present

## 2018-05-21 DIAGNOSIS — I482 Chronic atrial fibrillation, unspecified: Secondary | ICD-10-CM | POA: Diagnosis not present

## 2018-05-21 DIAGNOSIS — I10 Essential (primary) hypertension: Secondary | ICD-10-CM

## 2018-05-21 DIAGNOSIS — I701 Atherosclerosis of renal artery: Secondary | ICD-10-CM | POA: Diagnosis not present

## 2018-05-21 NOTE — Assessment & Plan Note (Signed)
History of CAD status post RCA PCI and drug-eluting stenting by Dr. Claiborne Billings 03/28/2008 with a Xience 5 drug-eluting stent.  He is done well since without chest pain.

## 2018-05-21 NOTE — Patient Instructions (Signed)
Medication Instructions:  NONE If you need a refill on your cardiac medications before your next appointment, please call your pharmacy.   Lab work: Your physician recommends that you have lab work collected by your PCP (LIPID/LIVER) If you have labs (blood work) drawn today and your tests are completely normal, you will receive your results only by: Marland Kitchen MyChart Message (if you have MyChart) OR . A paper copy in the mail If you have any lab test that is abnormal or we need to change your treatment, we will call you to review the results.  Testing/Procedures: Your physician has requested that you have a renal artery duplex. During this test, an ultrasound is used to evaluate blood flow to the kidneys. Allow one hour for this exam. Do not eat after midnight the day before and avoid carbonated beverages. Take your medications as you usually do.  August 2020   Follow-Up: At [facility], you and your health needs are our priority.  As part of our continuing mission to provide you with exceptional heart care, we have created designated Provider Care Teams.  These Care Teams include your primary Cardiologist (physician) and Advanced Practice Providers (APPs -  Physician Assistants and Nurse Practitioners) who all work together to provide you with the care you need, when you need it. . You will need a follow up appointment in 12 months.  Please call our office 2 months in advance to schedule this appointment.  You may see Dr. Gwenlyn Found or one of the following Advanced Practice Providers on your designated Care Team:   . [name], [place] . Almyra Deforest, PA-C . Fabian Sharp, PA-C . Jory Sims, DNP . Rosaria Ferries, PA-C . Roby Lofts, PA-C . Sande Rives, PA-C

## 2018-05-21 NOTE — Assessment & Plan Note (Signed)
History of hyperlipidemia on statin therapy with lipid profile performed 12/23/2016 revealing total cholesterol 103, LDL of 51 and HDL 36.  We will recheck a lipid and liver profile.

## 2018-05-21 NOTE — Assessment & Plan Note (Signed)
History of high-grade right ICA stenosis status post elective right carotid endarterectomy performed at my request by Dr. [name] 09/26/2015 which they followed noninvasively in their office.

## 2018-05-21 NOTE — Progress Notes (Signed)
05/21/2018 Steven Scott   [birth year]  [id number]  Primary Physician Steven School, MD Primary Cardiologist: Steven Harp MD FACP, Steven Scott, Steven Scott, Steven Scott  HPI:  Steven Scott is a 73 y.o. moderately overweight, married Caucasian male, father of 2 who I last saw in the office  06/09/2017. He has a history of persistent hypertension status post bilateral renal artery PTA and stenting with a positive Myoview, as well as heart catheterization that showed a high-grade distal RCA stenosis. I stented both his renal arteries successfully but was unable to cross his distal RCA, which Dr. Claiborne Scott subsequently performed successfully on March 28, 2008, with a Xience V drug-eluting stent with an excellent result. He clinically improved. He had an excellent lipid profile. Renal Dopplers performed January 31, 2011, showed progression of disease in both renal arteries suggesting high "in-stent restenosis." I performed angiography on him October 18 revealing 90% right and 70% left in-stent restenosis with 60 and 40-mm gradients, respectively. He underwent Steven Scott cutting balloon atherectomy of both renal arteries with an excellent angiographic and followup Doppler result. His blood pressure has remained stable. He is otherwise asymptomatic. He was changed to generic antihypertensive medications off of Tribenzor and apparently his blood pressures have been more difficult to control since that time. He does admit to dietary indiscretion with regards to salt as well.  I saw Steven Scott 04/19/12. He denies chest pain or shortness of breath. Apparently his creatinine has slowly increased now to close to 2. Recent renal Dopplers performed in our office 11/19/12 suggest rapid progression of the in-stent restenosis and throat within the left renal artery stent with a renal/aortic ratio of 7.33. Based on this, I re-angiogram him on 12/14/12 revealing an 80-90% mid in-stent restenosis" within the left renal artery  stent which I restented with a Steven Scott covered stent. His right renal artery had 60-70% ostial stenosis at that time. I performed cutting balloon angioplasty on the right renal artery in-stent restenosis.Followup Dopplers performed last month revealed normalization of his left renal artery to aortic ratio with mild progression of disease on the right. Since I saw him in the office 6 months ago he's done well . Renal Dopplers that suggest patent renal stents although his renal dimensions have mildly decreased. Because of high-grade right ICA stenosis I referred him to Dr. Trula Scott performed elective endarterectomy on 09/26/15. He had minimal disease on the left. A Myoview stress test on prior to that was nonischemic .   Since I saw him a year ago he is remained completely stable.  Denies chest pain or shortness of breath.  Current Meds  Medication Sig  . acetaminophen (TYLENOL) 500 MG tablet Take 500 mg by mouth every 6 (six) hours as needed for moderate pain.  Marland Kitchen aspirin EC 81 MG tablet Take 1 tablet (81 mg total) by mouth daily. Start on 11/15/14  . cholecalciferol (VITAMIN D) 1000 UNITS tablet Take 1,000 Units by mouth daily.  . clobetasol cream (TEMOVATE) 1.94 % Apply 1 application topically daily as needed (Psoriasis).  . diphenhydrAMINE (BENADRYL) 25 MG tablet Take 12.5 mg by mouth every 6 (six) hours as needed for allergies.  Marland Kitchen ELIQUIS 5 MG TABS tablet TAKE (1) TABLET BY MOUTH TWICE DAILY.  . nebivolol (BYSTOLIC) 5 MG tablet Take 1 tablet (5 mg total) by mouth daily with breakfast.  . Olmesartan-Amlodipine-HCTZ 40-10-25 MG TABS TAKE 1 TABLET BY MOUTH DAILY WITH BREAKFAST.  . pantoprazole (PROTONIX) 40 MG tablet TAKE (1) TABLET BY MOUTH ONCE  DAILY.  . simvastatin (ZOCOR) 20 MG tablet TAKE (1) TABLET BY MOUTH AT BEDTIME.     Allergies  Allergen Reactions  . Penicillins Rash    Has patient had a PCN reaction causing immediate rash, facial/tongue/throat swelling, SOB or lightheadedness with  hypotension: No Has patient had a PCN reaction causing severe rash involving mucus membranes or skin necrosis: No Has patient had a PCN reaction that required hospitalization No Has patient had a PCN reaction occurring within the last 10 years: No If all of the above answers are "NO", then may proceed with Cephalosporin use.    Social History   Socioeconomic History  . Marital status: Married    Spouse name: Not on file  . Number of children: Not on file  . Years of education: Not on file  . Highest education level: Not on file  Occupational History  . Not on file  Social Needs  . Financial resource strain: Not on file  . Food insecurity:    Worry: Not on file    Inability: Not on file  . Transportation needs:    Medical: Not on file    Non-medical: Not on file  Tobacco Use  . Smoking status: Former Smoker    Packs/day: 2.00    Years: 50.00    Pack years: 100.00    Types: Cigarettes    Last attempt to quit: 02/24/2011    Years since quitting: 7.2  . Smokeless tobacco: Never Used  Substance and Sexual Activity  . Alcohol use: No    Alcohol/week: 0.0 standard drinks    Comment: 12/13/2012 "last alcohol was ~ 4 yr ago"  . Drug use: No  . Sexual activity: Not Currently  Lifestyle  . Physical activity:    Days per week: Not on file    Minutes per session: Not on file  . Stress: Not on file  Relationships  . Social connections:    Talks on phone: Not on file    Gets together: Not on file    Attends religious service: Not on file    Active member of club or organization: Not on file    Attends meetings of clubs or organizations: Not on file    Relationship status: Not on file  . Intimate partner violence:    Fear of current or ex partner: Not on file    Emotionally abused: Not on file    Physically abused: Not on file    Forced sexual activity: Not on file  Other Topics Concern  . Not on file  Social History Narrative  . Not on file     Review of  Systems: General: negative for chills, fever, night sweats or weight changes.  Cardiovascular: negative for chest pain, dyspnea on exertion, edema, orthopnea, palpitations, paroxysmal nocturnal dyspnea or shortness of breath Dermatological: negative for rash Respiratory: negative for cough or wheezing Urologic: negative for hematuria Abdominal: negative for nausea, vomiting, diarrhea, bright red blood per rectum, melena, or hematemesis Neurologic: negative for visual changes, syncope, or dizziness All other systems reviewed and are otherwise negative except as noted above.    Blood pressure (!) 148/70, pulse 89, height 5\' 11"  (1.803 m), weight 266 lb (120.7 kg).  General appearance: alert and no distress Neck: no adenopathy, no carotid bruit, no JVD, supple, symmetrical, trachea midline and thyroid not enlarged, symmetric, no tenderness/mass/nodules Lungs: clear to auscultation bilaterally Heart: regular rate and rhythm, S1, S2 normal, no murmur, click, rub or gallop Extremities: extremities normal, atraumatic,  no cyanosis or edema Pulses: 2+ and symmetric Skin: Skin color, texture, turgor normal. No rashes or lesions Neurologic: Alert and oriented X 3, normal strength and tone. Normal symmetric reflexes. Normal coordination and gait  EKG atrial fibrillation with a ventricular response of 89 with septal Q waves.  I personally reviewed this EKG.  ASSESSMENT AND PLAN:   Hyperlipidemia History of hyperlipidemia on statin therapy with lipid profile performed 12/23/2016 revealing total cholesterol 103, LDL of 51 and HDL 36.  We will recheck a lipid and liver profile.  CAD -S/P RCA DES 2010 History of CAD status post RCA PCI and drug-eluting stenting by Dr. Claiborne Scott 03/28/2008 with a Xience 5 drug-eluting stent.  He is done well since without chest pain.  Essential hypertension History of essential hypertension with blood pressure measured today 148/70.  He is on Bystolic, olmesartan,  amlodipine and hydrochlorothiazide.  Renal artery stenosis History of bilateral renal artery stenosis status post bilateral stenting back in 2009 by myself.  He has had Cutting Balloon atherectomy most recently of his right renal artery for in-stent restenosis 02/15/2014 with restenting of his left with an I-Cath covered stent.  His most recent renal Dopplers performed 01/01/2018 revealing a small nonfunctioning right kidney the left kidney that measures 12 cm pole-to-pole and a renal aortic ratio of 5.2.  We will continue to follow this noninvasively on an annual basis.  Chronic atrial fibrillation (HCC) Chronic atrial fibrillation rate controlled on Eliquis oral anticoagulation.  Bilateral carotid artery disease (HCC) History of high-grade right ICA stenosis status post elective right carotid endarterectomy performed at my request by Dr. Trula Scott 09/26/2015 which they followed noninvasively in their office.      Steven Harp MD FACP,FACC,FAHA, [facility] 05/21/2018 11:11 AM

## 2018-05-21 NOTE — Assessment & Plan Note (Signed)
History of essential hypertension with blood pressure measured today 148/70.  He is on Bystolic, olmesartan, amlodipine and hydrochlorothiazide.

## 2018-05-21 NOTE — Assessment & Plan Note (Signed)
Chronic atrial fibrillation rate controlled on Eliquis oral anticoagulation. ?

## 2018-05-21 NOTE — Assessment & Plan Note (Signed)
History of bilateral renal artery stenosis status post bilateral stenting back in 2009 by myself.  He has had Cutting Balloon atherectomy most recently of his right renal artery for in-stent restenosis 02/15/2014 with restenting of his left with an I-Cath covered stent.  His most recent renal Dopplers performed 01/01/2018 revealing a small nonfunctioning right kidney the left kidney that measures 12 cm pole-to-pole and a renal aortic ratio of 5.2.  We will continue to follow this noninvasively on an annual basis.

## 2018-06-14 ENCOUNTER — Other Ambulatory Visit: Payer: Self-pay | Admitting: Cardiovascular Disease

## 2018-06-14 ENCOUNTER — Other Ambulatory Visit (INDEPENDENT_AMBULATORY_CARE_PROVIDER_SITE_OTHER): Payer: Self-pay | Admitting: Internal Medicine

## 2018-06-14 DIAGNOSIS — I482 Chronic atrial fibrillation, unspecified: Secondary | ICD-10-CM

## 2018-06-14 DIAGNOSIS — K279 Peptic ulcer, site unspecified, unspecified as acute or chronic, without hemorrhage or perforation: Secondary | ICD-10-CM

## 2018-06-14 NOTE — Telephone Encounter (Signed)
Rx request sent to pharmacy.  

## 2018-07-13 ENCOUNTER — Other Ambulatory Visit: Payer: Self-pay | Admitting: Cardiovascular Disease

## 2018-07-13 DIAGNOSIS — I482 Chronic atrial fibrillation, unspecified: Secondary | ICD-10-CM

## 2018-07-22 DIAGNOSIS — N183 Chronic kidney disease, stage 3 (moderate): Secondary | ICD-10-CM | POA: Diagnosis not present

## 2018-07-22 DIAGNOSIS — D649 Anemia, unspecified: Secondary | ICD-10-CM | POA: Diagnosis not present

## 2018-07-22 DIAGNOSIS — Z79899 Other long term (current) drug therapy: Secondary | ICD-10-CM | POA: Diagnosis not present

## 2018-07-22 DIAGNOSIS — R809 Proteinuria, unspecified: Secondary | ICD-10-CM | POA: Diagnosis not present

## 2018-08-11 ENCOUNTER — Other Ambulatory Visit: Payer: Self-pay | Admitting: Cardiovascular Disease

## 2018-08-11 DIAGNOSIS — I482 Chronic atrial fibrillation, unspecified: Secondary | ICD-10-CM

## 2018-08-11 NOTE — Telephone Encounter (Signed)
Eliquis and Bystolic refill.

## 2018-09-14 ENCOUNTER — Other Ambulatory Visit: Payer: Self-pay | Admitting: Cardiovascular Disease

## 2018-09-14 NOTE — Telephone Encounter (Signed)
Amlodipine -HCT and simvastatin refilled.

## 2018-11-22 DIAGNOSIS — E7849 Other hyperlipidemia: Secondary | ICD-10-CM | POA: Diagnosis not present

## 2018-11-22 DIAGNOSIS — Z1389 Encounter for screening for other disorder: Secondary | ICD-10-CM | POA: Diagnosis not present

## 2018-11-22 DIAGNOSIS — Z0001 Encounter for general adult medical examination with abnormal findings: Secondary | ICD-10-CM | POA: Diagnosis not present

## 2018-11-22 DIAGNOSIS — Z681 Body mass index (BMI) 19 or less, adult: Secondary | ICD-10-CM | POA: Diagnosis not present

## 2018-11-22 DIAGNOSIS — I10 Essential (primary) hypertension: Secondary | ICD-10-CM | POA: Diagnosis not present

## 2018-12-02 DIAGNOSIS — Z79899 Other long term (current) drug therapy: Secondary | ICD-10-CM | POA: Diagnosis not present

## 2018-12-02 DIAGNOSIS — I129 Hypertensive chronic kidney disease with stage 1 through stage 4 chronic kidney disease, or unspecified chronic kidney disease: Secondary | ICD-10-CM | POA: Diagnosis not present

## 2018-12-02 DIAGNOSIS — D509 Iron deficiency anemia, unspecified: Secondary | ICD-10-CM | POA: Diagnosis not present

## 2018-12-02 DIAGNOSIS — N183 Chronic kidney disease, stage 3 (moderate): Secondary | ICD-10-CM | POA: Diagnosis not present

## 2018-12-02 DIAGNOSIS — R809 Proteinuria, unspecified: Secondary | ICD-10-CM | POA: Diagnosis not present

## 2018-12-02 DIAGNOSIS — E559 Vitamin D deficiency, unspecified: Secondary | ICD-10-CM | POA: Diagnosis not present

## 2018-12-21 ENCOUNTER — Encounter (HOSPITAL_COMMUNITY): Payer: Medicare Other

## 2018-12-24 ENCOUNTER — Encounter (HOSPITAL_COMMUNITY): Payer: Medicare Other

## 2018-12-29 ENCOUNTER — Other Ambulatory Visit: Payer: Self-pay

## 2018-12-29 ENCOUNTER — Ambulatory Visit (HOSPITAL_COMMUNITY)
Admission: RE | Admit: 2018-12-29 | Discharge: 2018-12-29 | Disposition: A | Discharge status: Home / Self Care | Payer: Medicare Other | Source: Ambulatory Visit | Attending: Cardiovascular Disease | Admitting: Cardiovascular Disease

## 2018-12-29 DIAGNOSIS — I701 Atherosclerosis of renal artery: Secondary | ICD-10-CM | POA: Diagnosis not present

## 2018-12-31 ENCOUNTER — Other Ambulatory Visit: Payer: Self-pay | Admitting: *Deleted

## 2018-12-31 DIAGNOSIS — I701 Atherosclerosis of renal artery: Secondary | ICD-10-CM

## 2019-01-13 ENCOUNTER — Other Ambulatory Visit: Payer: Self-pay | Admitting: Cardiovascular Disease

## 2019-01-13 ENCOUNTER — Other Ambulatory Visit: Payer: Self-pay

## 2019-01-13 DIAGNOSIS — I482 Chronic atrial fibrillation, unspecified: Secondary | ICD-10-CM

## 2019-01-13 NOTE — Telephone Encounter (Signed)
lmomed the pt to come in for labs

## 2019-01-13 NOTE — Telephone Encounter (Signed)
Refill request

## 2019-01-18 ENCOUNTER — Other Ambulatory Visit (HOSPITAL_COMMUNITY)
Admission: RE | Admit: 2019-01-18 | Discharge: 2019-01-18 | Disposition: A | Discharge status: Home / Self Care | Payer: Medicare Other | Source: Ambulatory Visit | Attending: Cardiovascular Disease | Admitting: Cardiovascular Disease

## 2019-01-18 DIAGNOSIS — I482 Chronic atrial fibrillation, unspecified: Secondary | ICD-10-CM | POA: Insufficient documentation

## 2019-01-18 DIAGNOSIS — L400 Psoriasis vulgaris: Secondary | ICD-10-CM | POA: Diagnosis not present

## 2019-01-18 DIAGNOSIS — L570 Actinic keratosis: Secondary | ICD-10-CM | POA: Diagnosis not present

## 2019-01-18 DIAGNOSIS — L821 Other seborrheic keratosis: Secondary | ICD-10-CM | POA: Diagnosis not present

## 2019-01-18 DIAGNOSIS — C4442 Squamous cell carcinoma of skin of scalp and neck: Secondary | ICD-10-CM | POA: Diagnosis not present

## 2019-01-18 DIAGNOSIS — D485 Neoplasm of uncertain behavior of skin: Secondary | ICD-10-CM | POA: Diagnosis not present

## 2019-01-18 DIAGNOSIS — D1801 Hemangioma of skin and subcutaneous tissue: Secondary | ICD-10-CM | POA: Diagnosis not present

## 2019-01-18 LAB — COMPREHENSIVE METABOLIC PANEL
ALT: 17 U/L (ref 0–44)
AST: 17 U/L (ref 15–41)
Albumin: 3.7 g/dL (ref 3.5–5.0)
Alkaline Phosphatase: 69 U/L (ref 38–126)
Anion gap: 9 (ref 5–15)
BUN: 33 mg/dL — ABNORMAL HIGH (ref 8–23)
CO2: 27 mmol/L (ref 22–32)
Calcium: 9 mg/dL (ref 8.9–10.3)
Chloride: 104 mmol/L (ref 98–111)
Creatinine, Ser: 2.06 mg/dL — ABNORMAL HIGH (ref 0.61–1.24)
GFR calc Af Amer: 36 mL/min — ABNORMAL LOW (ref >60)
GFR calc non Af Amer: 31 mL/min — ABNORMAL LOW (ref >60)
Glucose, Bld: 114 mg/dL — ABNORMAL HIGH (ref 70–99)
Potassium: 4.3 mmol/L (ref 3.5–5.1)
Sodium: 140 mmol/L (ref 135–145)
Total Bilirubin: 0.8 mg/dL (ref 0.3–1.2)
Total Protein: 7 g/dL (ref 6.5–8.1)

## 2019-01-27 DIAGNOSIS — C4442 Squamous cell carcinoma of skin of scalp and neck: Secondary | ICD-10-CM | POA: Diagnosis not present

## 2019-03-04 DIAGNOSIS — Z23 Encounter for immunization: Secondary | ICD-10-CM | POA: Diagnosis not present

## 2019-03-16 ENCOUNTER — Other Ambulatory Visit: Payer: Self-pay | Admitting: Cardiovascular Disease

## 2019-03-21 ENCOUNTER — Telehealth: Payer: Self-pay | Admitting: Vascular Surgery

## 2019-03-21 NOTE — Telephone Encounter (Signed)
Mr. Flight called in he got his re-call letter and he is not wanting to schedule at this time due to no insurance. He will call back to schedule when he has insurance it could be the first of the year.   [facility]

## 2019-04-13 ENCOUNTER — Other Ambulatory Visit: Payer: Self-pay | Admitting: Cardiovascular Disease

## 2019-05-16 ENCOUNTER — Other Ambulatory Visit: Payer: Self-pay | Admitting: Cardiovascular Disease

## 2019-05-16 DIAGNOSIS — I482 Chronic atrial fibrillation, unspecified: Secondary | ICD-10-CM

## 2019-05-17 DIAGNOSIS — Z79899 Other long term (current) drug therapy: Secondary | ICD-10-CM | POA: Diagnosis not present

## 2019-05-17 DIAGNOSIS — N1832 Chronic kidney disease, stage 3b: Secondary | ICD-10-CM | POA: Diagnosis not present

## 2019-05-17 DIAGNOSIS — R809 Proteinuria, unspecified: Secondary | ICD-10-CM | POA: Diagnosis not present

## 2019-05-17 DIAGNOSIS — I10 Essential (primary) hypertension: Secondary | ICD-10-CM | POA: Diagnosis not present

## 2019-05-17 DIAGNOSIS — D631 Anemia in chronic kidney disease: Secondary | ICD-10-CM | POA: Diagnosis not present

## 2019-05-25 DIAGNOSIS — E559 Vitamin D deficiency, unspecified: Secondary | ICD-10-CM | POA: Diagnosis not present

## 2019-05-25 DIAGNOSIS — R809 Proteinuria, unspecified: Secondary | ICD-10-CM | POA: Diagnosis not present

## 2019-05-25 DIAGNOSIS — I129 Hypertensive chronic kidney disease with stage 1 through stage 4 chronic kidney disease, or unspecified chronic kidney disease: Secondary | ICD-10-CM | POA: Diagnosis not present

## 2019-05-25 DIAGNOSIS — E211 Secondary hyperparathyroidism, not elsewhere classified: Secondary | ICD-10-CM | POA: Diagnosis not present

## 2019-05-25 DIAGNOSIS — I701 Atherosclerosis of renal artery: Secondary | ICD-10-CM | POA: Diagnosis not present

## 2019-05-31 ENCOUNTER — Encounter: Payer: Self-pay | Admitting: Cardiovascular Disease

## 2019-05-31 ENCOUNTER — Ambulatory Visit (INDEPENDENT_AMBULATORY_CARE_PROVIDER_SITE_OTHER): Payer: Medicare Other | Admitting: Cardiovascular Disease

## 2019-05-31 ENCOUNTER — Encounter (INDEPENDENT_AMBULATORY_CARE_PROVIDER_SITE_OTHER): Payer: Self-pay

## 2019-05-31 ENCOUNTER — Other Ambulatory Visit: Payer: Self-pay

## 2019-05-31 DIAGNOSIS — I482 Chronic atrial fibrillation, unspecified: Secondary | ICD-10-CM | POA: Diagnosis not present

## 2019-05-31 DIAGNOSIS — I2510 Atherosclerotic heart disease of native coronary artery without angina pectoris: Secondary | ICD-10-CM

## 2019-05-31 DIAGNOSIS — I6523 Occlusion and stenosis of bilateral carotid arteries: Secondary | ICD-10-CM

## 2019-05-31 DIAGNOSIS — Z9861 Coronary angioplasty status: Secondary | ICD-10-CM

## 2019-05-31 DIAGNOSIS — E782 Mixed hyperlipidemia: Secondary | ICD-10-CM

## 2019-05-31 DIAGNOSIS — I10 Essential (primary) hypertension: Secondary | ICD-10-CM | POA: Diagnosis not present

## 2019-05-31 NOTE — Assessment & Plan Note (Signed)
History of bilateral renal artery stenosis status post bilateral renal artery stenting by myself in 2009.  He said Cutting Balloon atherectomy of his right renal artery for in-stent restenosis and restenting of his left renal artery stent 02/15/2014 with an eye cast covered stent.  His Dopplers have shown occlusion of his right kidney that we have been following his left renal artery.  His renal aortic ratio has a slightly increased since I saw him up to 6.38 from 5.1.  His renal function has remained stable with a serum creatinine in the 2 range.  We will continue to follow him by annual repeat renal duplex study.  His left renal dimension has remained stable at 12.2 cm.

## 2019-05-31 NOTE — Assessment & Plan Note (Signed)
History of hyperlipidemia on statin therapy.  We will recheck a lipid liver profile 

## 2019-05-31 NOTE — Patient Instructions (Signed)
Medication Instructions:  Your physician recommends that you continue on your current medications as directed. Please refer to the Current Medication list given to you today.  If you need a refill on your cardiac medications before your next appointment, please call your pharmacy.   Lab work: Fastins Lipids and Heaptic Function in 2 monthd If you have labs (blood work) drawn today and your tests are completely normal, you will receive your results only by: [employer] (if you have MyChart) OR A paper copy in the mail If you have any lab test that is abnormal or we need to change your treatment, we will call you to review the results.  Testing/Procedures: Your physician has requested that you have a renal artery duplex in August 2021. During this test, an ultrasound is used to evaluate blood flow to the kidneys. Allow one hour for this exam. Do not eat after midnight the day before and avoid carbonated beverages. Take your medications as you usually do.    Follow-Up: At [facility], you and your health needs are our priority.  As part of our continuing mission to provide you with exceptional heart care, we have created designated Provider Care Teams.  These Care Teams include your primary Cardiologist (physician) and Advanced Practice Providers (APPs -  Physician Assistants and Nurse Practitioners) who all work together to provide you with the care you need, when you need it. You may see Dr. Gwenlyn Found or one of the following Advanced Practice Providers on your designated Care Team:    [name], PA-C  [place], [place]  Coletta Memos, [place]   Your physician wants you to follow-up in: 1 year with Dr. Gwenlyn Found        \

## 2019-05-31 NOTE — Assessment & Plan Note (Signed)
History of CAD status post RCA PCI and drug-eluting stenting by Dr. Claiborne Billings 03/28/2008 with a Xience drug-eluting stent.  He denies chest pain or shortness of breath.

## 2019-05-31 NOTE — Assessment & Plan Note (Signed)
History of carotid artery disease status post elective right carotid endarterectomy performed by Dr. [name] at my request 09/26/2015.  He had minimal disease on the left.  These were followed by Dr. [name] noninvasively.

## 2019-05-31 NOTE — Progress Notes (Signed)
05/31/2019 Steven Scott   07/18/1945  [id number]  Primary Physician [employer], MD Primary Cardiologist: [name] MD FACP, [place], [place], [name]  HPI:  Steven Scott is a 74 y.o.  moderately overweight, married Caucasian male, father of 2 who I last saw in the office  05/21/2018. He has a history of persistent hypertension status post bilateral renal artery PTA and stenting with a positive Myoview, as well as heart catheterization that showed a high-grade distal RCA stenosis. I stented both his renal arteries successfully but was unable to cross his distal RCA, which Dr. Claiborne Billings subsequently performed successfully on March 28, 2008, with a Xience V drug-eluting stent with an excellent result. He clinically improved. He had an excellent lipid profile. Renal Dopplers performed January 31, 2011, showed progression of disease in both renal arteries suggesting high "in-stent restenosis." I performed angiography on him October 18 revealing 90% right and 70% left in-stent restenosis with 60 and 40-mm gradients, respectively. He underwent AngioSculpt cutting balloon atherectomy of both renal arteries with an excellent angiographic and followup Doppler result. His blood pressure has remained stable. He is otherwise asymptomatic. He was changed to generic antihypertensive medications off of Tribenzor and apparently his blood pressures have been more difficult to control since that time. He does admit to dietary indiscretion with regards to salt as well.  I saw Steven Scott 04/19/12. He denies chest pain or shortness of breath. Apparently his creatinine has slowly increased now to close to 2. Recent renal Dopplers performed in our office 11/19/12 suggest rapid progression of the in-stent restenosis and throat within the left renal artery stent with a renal/aortic ratio of 7.33. Based on this, I re-angiogram him on 12/14/12 revealing an 80-90% mid in-stent restenosis" within the left renal artery  stent which I restented with a ICast covered stent. His right renal artery had 60-70% ostial stenosis at that time. I performed cutting balloon angioplasty on the right renal artery in-stent restenosis.Followup Dopplers performed last month revealed normalization of his left renal artery to aortic ratio with mild progression of disease on the right. Since I saw him in the office 6 months ago he's done well . Renal Dopplers that suggest patent renal stents although his renal dimensions have mildly decreased. Because of high-grade right ICA stenosis I referred him to Dr. [name] performed elective endarterectomy on 09/26/15. He had minimal disease on the left. A Myoview stress test on prior to that was nonischemic .   Since I saw him a year ago he is remained completely stable.  Denies chest pain or shortness of breath.  Unfortunately, his wife of 53 years died 02-Feb-2019.  She had Alzheimer's for many years and he was taking care of her.  He does have 2 children that live in [place] but no grandchildren yet.  He did have renal Doppler studies performed 12/29/2018 that shows some progression of disease on the left with known occluded right renal artery.  His renal aortic ratio increased from 5.1-6.4.  Renal dimensions remained stable at 12.2 cm.  His blood pressure has been under good control.   Current Meds  Medication Sig  . acetaminophen (TYLENOL) 500 MG tablet Take 500 mg by mouth every 6 (six) hours as needed for moderate pain.  Marland Kitchen aspirin EC 81 MG tablet Take 1 tablet (81 mg total) by mouth daily. Start on 11/15/14  . BYSTOLIC 5 MG tablet TAKE 1 TABLET BY MOUTH DAILY WITH BREAKFAST.  . cholecalciferol (VITAMIN D) 1000  UNITS tablet Take 1,000 Units by mouth daily.  . clobetasol cream (TEMOVATE) 7.41 % Apply 1 application topically daily as needed (Psoriasis).  . diphenhydrAMINE (BENADRYL) 25 MG tablet Take 12.5 mg by mouth every 6 (six) hours as needed for allergies.  Marland Kitchen ELIQUIS 5 MG TABS tablet TAKE  (1) TABLET BY MOUTH TWICE DAILY.  Marland Kitchen Olmesartan-amLODIPine-HCTZ 40-10-25 MG TABS TAKE 1 TABLET BY MOUTH DAILY WITH BREAKFAST.  . pantoprazole (PROTONIX) 40 MG tablet TAKE (1) TABLET BY MOUTH ONCE DAILY.  . simvastatin (ZOCOR) 20 MG tablet TAKE (1) TABLET BY MOUTH AT BEDTIME.     Allergies  Allergen Reactions  . Penicillins Rash    Has patient had a PCN reaction causing immediate rash, facial/tongue/throat swelling, SOB or lightheadedness with hypotension: No Has patient had a PCN reaction causing severe rash involving mucus membranes or skin necrosis: No Has patient had a PCN reaction that required hospitalization No Has patient had a PCN reaction occurring within the last 10 years: No If all of the above answers are "NO", then may proceed with Cephalosporin use.    Social History   Socioeconomic History  . Marital status: Married    Spouse name: Not on file  . Number of children: Not on file  . Years of education: Not on file  . Highest education level: Not on file  Occupational History  . Not on file  Tobacco Use  . Smoking status: Former Smoker    Packs/day: 2.00    Years: 50.00    Pack years: 100.00    Types: Cigarettes    Quit date: 02/24/2011    Years since quitting: 8.2  . Smokeless tobacco: Never Used  Substance and Sexual Activity  . Alcohol use: No    Alcohol/week: 0.0 standard drinks    Comment: 12/13/2012 "last alcohol was ~ 4 yr ago"  . Drug use: No  . Sexual activity: Not Currently  Other Topics Concern  . Not on file  Social History Narrative  . Not on file   Social Determinants of Health   Financial Resource Strain:   . Difficulty of Paying Living Expenses: Not on file  Food Insecurity:   . Worried About Charity fundraiser in the Last Year: Not on file  . Ran Out of Food in the Last Year: Not on file  Transportation Needs:   . Lack of Transportation (Medical): Not on file  . Lack of Transportation (Non-Medical): Not on file  Physical Activity:     . Days of Exercise per Week: Not on file  . Minutes of Exercise per Session: Not on file  Stress:   . Feeling of Stress : Not on file  Social Connections:   . Frequency of Communication with Friends and Family: Not on file  . Frequency of Social Gatherings with Friends and Family: Not on file  . Attends Religious Services: Not on file  . Active Member of Clubs or Organizations: Not on file  . Attends Archivist Meetings: Not on file  . Marital Status: Not on file  Intimate Partner Violence:   . Fear of Current or Ex-Partner: Not on file  . Emotionally Abused: Not on file  . Physically Abused: Not on file  . Sexually Abused: Not on file     Review of Systems: General: negative for chills, fever, night sweats or weight changes.  Cardiovascular: negative for chest pain, dyspnea on exertion, edema, orthopnea, palpitations, paroxysmal nocturnal dyspnea or shortness of breath Dermatological: negative for  rash Respiratory: negative for cough or wheezing Urologic: negative for hematuria Abdominal: negative for nausea, vomiting, diarrhea, bright red blood per rectum, melena, or hematemesis Neurologic: negative for visual changes, syncope, or dizziness All other systems reviewed and are otherwise negative except as noted above.    Blood pressure 136/60, pulse 84, temperature (!) 97.5 F (36.4 C), height 5\' 11"  (1.803 m), weight 260 lb (117.9 kg).  General appearance: alert and no distress Neck: no adenopathy, no carotid bruit, no JVD, supple, symmetrical, trachea midline and thyroid not enlarged, symmetric, no tenderness/mass/nodules Lungs: clear to auscultation bilaterally Heart: irregularly irregular rhythm Extremities: extremities normal, atraumatic, no cyanosis or edema Pulses: 2+ and symmetric Skin: Skin color, texture, turgor normal. No rashes or lesions Neurologic: Alert and oriented X 3, normal strength and tone. Normal symmetric reflexes. Normal coordination and  gait  EKG atrial fibrillation with a ventricular spots of 84 and septal Q waves.  I personally reviewed this EKG.  ASSESSMENT AND PLAN:   Hyperlipidemia History of hyperlipidemia on statin therapy.  We will recheck a lipid liver profile.  CAD -S/P RCA DES 2010 History of CAD status post RCA PCI and drug-eluting stenting by Dr. Claiborne Billings 03/28/2008 with a Xience drug-eluting stent.  He denies chest pain or shortness of breath.  Essential hypertension History of essential hypertension with blood pressure measured today 136/60.  He is on Bystolic, olmesartan and amlodipine.  Renal artery stenosis History of bilateral renal artery stenosis status post bilateral renal artery stenting by myself in 2009.  He said Cutting Balloon atherectomy of his right renal artery for in-stent restenosis and restenting of his left renal artery stent 02/15/2014 with an eye cast covered stent.  His Dopplers have shown occlusion of his right kidney that we have been following his left renal artery.  His renal aortic ratio has a slightly increased since I saw him up to 6.38 from 5.1.  His renal function has remained stable with a serum creatinine in the 2 range.  We will continue to follow him by annual repeat renal duplex study.  His left renal dimension has remained stable at 12.2 cm.  Chronic atrial fibrillation (HCC) Chronic A. fib rate controlled on Eliquis oral anticoagulation.  Bilateral carotid artery disease (HCC) History of carotid artery disease status post elective right carotid endarterectomy performed by Dr. [name] at my request 09/26/2015.  He had minimal disease on the left.  These were followed by Dr. [name] noninvasively.      [name] MD FACP,FACC,FAHA, [facility] 05/31/2019 2:33 PM

## 2019-05-31 NOTE — Assessment & Plan Note (Signed)
History of essential hypertension with blood pressure measured today 136/60.  He is on Bystolic, olmesartan and amlodipine.

## 2019-05-31 NOTE — Assessment & Plan Note (Signed)
Chronic A. fib rate controlled on Eliquis oral anticoagulation. 

## 2019-06-10 ENCOUNTER — Other Ambulatory Visit: Payer: Self-pay | Admitting: Cardiovascular Disease

## 2019-06-13 ENCOUNTER — Encounter (INDEPENDENT_AMBULATORY_CARE_PROVIDER_SITE_OTHER): Payer: Self-pay | Admitting: *Deleted

## 2019-06-14 ENCOUNTER — Other Ambulatory Visit: Payer: Self-pay | Admitting: Cardiovascular Disease

## 2019-06-14 DIAGNOSIS — I482 Chronic atrial fibrillation, unspecified: Secondary | ICD-10-CM

## 2019-07-12 ENCOUNTER — Other Ambulatory Visit: Payer: Self-pay

## 2019-07-12 ENCOUNTER — Other Ambulatory Visit: Payer: Self-pay | Admitting: Cardiovascular Disease

## 2019-07-12 ENCOUNTER — Other Ambulatory Visit (HOSPITAL_COMMUNITY): Payer: Self-pay | Admitting: Family Medicine

## 2019-07-12 ENCOUNTER — Encounter (HOSPITAL_COMMUNITY): Payer: Self-pay

## 2019-07-12 ENCOUNTER — Ambulatory Visit (HOSPITAL_COMMUNITY)
Admission: RE | Admit: 2019-07-12 | Discharge: 2019-07-12 | Disposition: A | Discharge status: Home / Self Care | Payer: Medicare Other | Source: Ambulatory Visit | Attending: Family Medicine | Admitting: Family Medicine

## 2019-07-12 DIAGNOSIS — S8991XA Unspecified injury of right lower leg, initial encounter: Secondary | ICD-10-CM

## 2019-07-12 DIAGNOSIS — R6889 Other general symptoms and signs: Secondary | ICD-10-CM | POA: Diagnosis not present

## 2019-07-12 DIAGNOSIS — M79661 Pain in right lower leg: Secondary | ICD-10-CM | POA: Diagnosis not present

## 2019-07-27 ENCOUNTER — Other Ambulatory Visit (INDEPENDENT_AMBULATORY_CARE_PROVIDER_SITE_OTHER): Payer: Self-pay | Admitting: *Deleted

## 2019-07-28 ENCOUNTER — Other Ambulatory Visit (INDEPENDENT_AMBULATORY_CARE_PROVIDER_SITE_OTHER): Payer: Self-pay | Admitting: *Deleted

## 2019-07-28 DIAGNOSIS — Z1211 Encounter for screening for malignant neoplasm of colon: Secondary | ICD-10-CM

## 2019-08-10 DIAGNOSIS — D631 Anemia in chronic kidney disease: Secondary | ICD-10-CM | POA: Diagnosis not present

## 2019-08-10 DIAGNOSIS — E559 Vitamin D deficiency, unspecified: Secondary | ICD-10-CM | POA: Diagnosis not present

## 2019-08-10 DIAGNOSIS — R809 Proteinuria, unspecified: Secondary | ICD-10-CM | POA: Diagnosis not present

## 2019-08-10 DIAGNOSIS — E211 Secondary hyperparathyroidism, not elsewhere classified: Secondary | ICD-10-CM | POA: Diagnosis not present

## 2019-08-10 DIAGNOSIS — I129 Hypertensive chronic kidney disease with stage 1 through stage 4 chronic kidney disease, or unspecified chronic kidney disease: Secondary | ICD-10-CM | POA: Diagnosis not present

## 2019-08-15 ENCOUNTER — Encounter (INDEPENDENT_AMBULATORY_CARE_PROVIDER_SITE_OTHER): Payer: Self-pay | Admitting: *Deleted

## 2019-08-23 DIAGNOSIS — I10 Essential (primary) hypertension: Secondary | ICD-10-CM | POA: Diagnosis not present

## 2019-08-23 DIAGNOSIS — N184 Chronic kidney disease, stage 4 (severe): Secondary | ICD-10-CM | POA: Diagnosis not present

## 2019-08-23 DIAGNOSIS — E7849 Other hyperlipidemia: Secondary | ICD-10-CM | POA: Diagnosis not present

## 2019-08-23 DIAGNOSIS — I2510 Atherosclerotic heart disease of native coronary artery without angina pectoris: Secondary | ICD-10-CM | POA: Diagnosis not present

## 2019-08-23 DIAGNOSIS — R6889 Other general symptoms and signs: Secondary | ICD-10-CM | POA: Diagnosis not present

## 2019-08-23 DIAGNOSIS — R7309 Other abnormal glucose: Secondary | ICD-10-CM | POA: Diagnosis not present

## 2019-08-25 ENCOUNTER — Ambulatory Visit (INDEPENDENT_AMBULATORY_CARE_PROVIDER_SITE_OTHER): Payer: Self-pay

## 2019-08-25 ENCOUNTER — Other Ambulatory Visit: Payer: Self-pay

## 2019-08-25 ENCOUNTER — Telehealth (INDEPENDENT_AMBULATORY_CARE_PROVIDER_SITE_OTHER): Payer: Self-pay | Admitting: *Deleted

## 2019-08-25 NOTE — Telephone Encounter (Signed)
Referring MD/PCP: golding   Procedure: tcs  Reason/Indication:  screening  Has patient had this procedure before?  Yes, 2012  If so, when, by whom and where?    Is there a family history of colon cancer?  no  Who?  What age when diagnosed?    Is patient diabetic?   no      Does patient have prosthetic heart valve or mechanical valve?  no  Do you have a pacemaker/defibrillator?  no  Has patient ever had endocarditis/atrial fibrillation? no  Does patient use oxygen? no  Has patient had joint replacement within last 12 months?  no  Is patient constipated or do they take laxatives? no  Does patient have a history of alcohol/drug use?  no  Is patient on blood thinner such as Coumadin, Plavix and/or Aspirin? yes  Medications: asa 81 mg daily, eliquis 5 mg bid, cholecalciferol 25 mg daily, clobetasol cream, benadryl 25 mg 1/2 tab daily, nebivolol 5 mg daily, olmesartan 40/10/25 mg daily, pantoprazole 40 mg daily, simvastatin 20 mg daily  Allergies: nkda  Medication Adjustment per Dr Laural Golden: asa & eliquis 2 days  Procedure date & time: 09/21/19

## 2019-08-26 ENCOUNTER — Telehealth: Payer: Self-pay

## 2019-08-26 ENCOUNTER — Telehealth (INDEPENDENT_AMBULATORY_CARE_PROVIDER_SITE_OTHER): Payer: Self-pay | Admitting: *Deleted

## 2019-08-26 ENCOUNTER — Encounter (INDEPENDENT_AMBULATORY_CARE_PROVIDER_SITE_OTHER): Payer: Self-pay | Admitting: *Deleted

## 2019-08-26 MED ORDER — PLENVU 140 G PO SOLR: 1.0000 | Freq: Once | ORAL | 0 refills | Status: AC

## 2019-08-26 NOTE — Telephone Encounter (Signed)
Patient with diagnosis of atrial fibrillaton on Eliquis for anticoagulation.    Procedure: colonoscopy Date of procedure: 09/21/19  CHADS2-VASc score of  4 (HTN, AGE x 2 (on day of procedure), CAD)   CrCl 52.5 Platelet count 222  Per office protocol, patient can hold Eliquis for 1 days prior to procedure.

## 2019-08-26 NOTE — Telephone Encounter (Signed)
Pharmacy, can you please comment on how long patient can hold Eliquis for upcoming colonoscopy?  Thank you! 

## 2019-08-26 NOTE — Telephone Encounter (Signed)
Patient needs Plenvu (copay card) ° °

## 2019-08-26 NOTE — Telephone Encounter (Signed)
   [facility] Pre-operative Risk Assessment    Request for surgical clearance:  1. What type of surgery is being performed? Colonoscopy   2. When is this surgery scheduled? 09/21/19   3. What type of clearance is required (medical clearance vs. Pharmacy clearance to hold med vs. Both)? BOTH  4. Are there any medications that need to be held prior to surgery and how long?  Aspirin and Eliquis   5. Practice name and name of physician performing surgery? [facility]    6. What is your office phone number [phone]    7.   What is your office fax number [phone] ATTN: Lacretia Nicks referral coordinator   8.   Anesthesia type (None, local, MAC, general) ? Unknown   Steven Scott 08/26/2019, 9:45 AM  _________________________________________________________________   (provider comments below)

## 2019-08-29 NOTE — Telephone Encounter (Signed)
   Primary Cardiologist: Quay Burow, MD  Chart reviewed as part of pre-operative protocol coverage.   Hx of CAD s/p DES to RCA in 2010, persistent afib on Eliquis, bilateral renal artery stenosis s/p bilateral renal artery stenting in 2009 and stenting for left renal artery in-stent stenosis 02/2014, and carotid artery disease status post elective right carotid endarterectomy performed by Dr. [name] 09/26/2015.   Dr. Gwenlyn Found, Please give your recommendation about Aspirin for colonoscopy.   Unable to reach the patient twice to go over symptoms.   Fellow[place], [place] 08/29/2019, 9:52 AM

## 2019-08-30 NOTE — Progress Notes (Signed)
Okay to hold aspirin and Eliquis for his GI procedure

## 2019-08-30 NOTE — Telephone Encounter (Signed)
Okay to interrupt antiplatelet therapy as well as DOAC for GI procedure.

## 2019-08-31 NOTE — Telephone Encounter (Signed)
   Primary Cardiologist: Quay Burow, MD  Chart reviewed as part of pre-operative protocol coverage. Given past medical history and time since last visit, based on ACC/AHA guidelines, ARRIS MEYN would be at acceptable risk for the planned procedure without further cardiovascular testing.   Per Pharmacey and Dr. Gwenlyn Found Patient with diagnosis of atrial fibrillaton on Eliquis for anticoagulation.    Procedure: colonoscopy Date of procedure: 09/21/19  CHADS2-VASc score of  4 (HTN, AGE x 2 (on day of procedure), CAD)              CrCl 52.5 Platelet count 222  Per office protocol, patient can hold Eliquis for 1 days prior to procedure and hold ASA one day before planned procedure.     I will route this recommendation to the requesting party via Epic fax function and remove from pre-op pool.  Please call with questions.  Phill Myron. Briston Lax DNP, ANP, AACC  08/31/2019, 10:01 AM

## 2019-09-01 ENCOUNTER — Telehealth: Payer: Self-pay

## 2019-09-01 NOTE — Telephone Encounter (Signed)
Spoke to patient's daughter Odis Hollingshead advised ok to hold eliquis and aspirin prior to his GI procedure.

## 2019-09-07 DIAGNOSIS — R809 Proteinuria, unspecified: Secondary | ICD-10-CM | POA: Diagnosis not present

## 2019-09-07 DIAGNOSIS — E211 Secondary hyperparathyroidism, not elsewhere classified: Secondary | ICD-10-CM | POA: Diagnosis not present

## 2019-09-07 DIAGNOSIS — I129 Hypertensive chronic kidney disease with stage 1 through stage 4 chronic kidney disease, or unspecified chronic kidney disease: Secondary | ICD-10-CM | POA: Diagnosis not present

## 2019-09-07 DIAGNOSIS — E559 Vitamin D deficiency, unspecified: Secondary | ICD-10-CM | POA: Diagnosis not present

## 2019-09-09 NOTE — Telephone Encounter (Signed)
Ok to schedule.

## 2019-09-14 ENCOUNTER — Other Ambulatory Visit: Payer: Self-pay | Admitting: Cardiovascular Disease

## 2019-09-19 ENCOUNTER — Other Ambulatory Visit: Payer: Self-pay

## 2019-09-19 ENCOUNTER — Other Ambulatory Visit (HOSPITAL_COMMUNITY)
Admission: RE | Admit: 2019-09-19 | Discharge: 2019-09-19 | Disposition: A | Discharge status: Home / Self Care | Payer: Medicare Other | Source: Ambulatory Visit | Attending: Internal Medicine | Admitting: Internal Medicine

## 2019-09-19 DIAGNOSIS — Z20822 Contact with and (suspected) exposure to covid-19: Secondary | ICD-10-CM | POA: Diagnosis not present

## 2019-09-19 DIAGNOSIS — Z01812 Encounter for preprocedural laboratory examination: Secondary | ICD-10-CM | POA: Insufficient documentation

## 2019-09-20 LAB — SARS CORONAVIRUS 2 (TAT 6-24 HRS): SARS Coronavirus 2: NEGATIVE

## 2019-09-21 ENCOUNTER — Encounter (HOSPITAL_COMMUNITY)
Admission: RE | Disposition: A | Discharge status: Home / Self Care | Payer: Self-pay | Source: Home / Self Care | Attending: Internal Medicine

## 2019-09-21 ENCOUNTER — Ambulatory Visit (HOSPITAL_COMMUNITY)
Admission: RE | Admit: 2019-09-21 | Discharge: 2019-09-21 | Disposition: A | Discharge status: Home / Self Care | Payer: Medicare Other | Attending: Internal Medicine | Admitting: Internal Medicine

## 2019-09-21 ENCOUNTER — Encounter (HOSPITAL_COMMUNITY): Payer: Self-pay | Admitting: Internal Medicine

## 2019-09-21 ENCOUNTER — Other Ambulatory Visit: Payer: Self-pay

## 2019-09-21 DIAGNOSIS — Z86010 Personal history of colonic polyps: Secondary | ICD-10-CM | POA: Insufficient documentation

## 2019-09-21 DIAGNOSIS — K219 Gastro-esophageal reflux disease without esophagitis: Secondary | ICD-10-CM | POA: Insufficient documentation

## 2019-09-21 DIAGNOSIS — E785 Hyperlipidemia, unspecified: Secondary | ICD-10-CM | POA: Diagnosis not present

## 2019-09-21 DIAGNOSIS — I4891 Unspecified atrial fibrillation: Secondary | ICD-10-CM | POA: Diagnosis not present

## 2019-09-21 DIAGNOSIS — Z87891 Personal history of nicotine dependence: Secondary | ICD-10-CM | POA: Diagnosis not present

## 2019-09-21 DIAGNOSIS — K644 Residual hemorrhoidal skin tags: Secondary | ICD-10-CM | POA: Diagnosis not present

## 2019-09-21 DIAGNOSIS — Z955 Presence of coronary angioplasty implant and graft: Secondary | ICD-10-CM | POA: Insufficient documentation

## 2019-09-21 DIAGNOSIS — I10 Essential (primary) hypertension: Secondary | ICD-10-CM | POA: Diagnosis not present

## 2019-09-21 DIAGNOSIS — Z1211 Encounter for screening for malignant neoplasm of colon: Secondary | ICD-10-CM

## 2019-09-21 DIAGNOSIS — K279 Peptic ulcer, site unspecified, unspecified as acute or chronic, without hemorrhage or perforation: Secondary | ICD-10-CM

## 2019-09-21 DIAGNOSIS — Z79899 Other long term (current) drug therapy: Secondary | ICD-10-CM | POA: Insufficient documentation

## 2019-09-21 DIAGNOSIS — K5730 Diverticulosis of large intestine without perforation or abscess without bleeding: Secondary | ICD-10-CM | POA: Diagnosis not present

## 2019-09-21 DIAGNOSIS — I2510 Atherosclerotic heart disease of native coronary artery without angina pectoris: Secondary | ICD-10-CM | POA: Insufficient documentation

## 2019-09-21 DIAGNOSIS — Z7982 Long term (current) use of aspirin: Secondary | ICD-10-CM | POA: Insufficient documentation

## 2019-09-21 DIAGNOSIS — D120 Benign neoplasm of cecum: Secondary | ICD-10-CM | POA: Insufficient documentation

## 2019-09-21 DIAGNOSIS — Z20822 Contact with and (suspected) exposure to covid-19: Secondary | ICD-10-CM | POA: Diagnosis not present

## 2019-09-21 DIAGNOSIS — D122 Benign neoplasm of ascending colon: Secondary | ICD-10-CM | POA: Insufficient documentation

## 2019-09-21 DIAGNOSIS — I482 Chronic atrial fibrillation, unspecified: Secondary | ICD-10-CM

## 2019-09-21 DIAGNOSIS — Z7901 Long term (current) use of anticoagulants: Secondary | ICD-10-CM | POA: Diagnosis not present

## 2019-09-21 DIAGNOSIS — Z09 Encounter for follow-up examination after completed treatment for conditions other than malignant neoplasm: Secondary | ICD-10-CM | POA: Diagnosis not present

## 2019-09-21 HISTORY — PX: COLONOSCOPY: SHX5424

## 2019-09-21 HISTORY — PX: POLYPECTOMY: SHX5525

## 2019-09-21 SURGERY — COLONOSCOPY
Anesthesia: Moderate Sedation

## 2019-09-21 MED ORDER — MIDAZOLAM HCL 5 MG/5ML IJ SOLN
INTRAMUSCULAR | Status: AC
  Filled 2019-09-21: qty 10

## 2019-09-21 MED ORDER — MEPERIDINE HCL 50 MG/ML IJ SOLN
INTRAMUSCULAR | Status: DC | PRN
  Administered 2019-09-21 (×2): 25 mg via INTRAVENOUS

## 2019-09-21 MED ORDER — MIDAZOLAM HCL 5 MG/5ML IJ SOLN
INTRAMUSCULAR | Status: DC | PRN
  Administered 2019-09-21: 1 mg via INTRAVENOUS
  Administered 2019-09-21 (×2): 2 mg via INTRAVENOUS

## 2019-09-21 MED ORDER — PANTOPRAZOLE SODIUM 40 MG PO TBEC: 40.0000 mg | DELAYED_RELEASE_TABLET | Freq: Every day | ORAL | 5 refills | Status: AC

## 2019-09-21 MED ORDER — STERILE WATER FOR IRRIGATION IR SOLN
Status: DC | PRN
  Administered 2019-09-21: 1.5 mL

## 2019-09-21 MED ORDER — ASPIRIN EC 81 MG PO TBEC: 81.0000 mg | DELAYED_RELEASE_TABLET | Freq: Every day | ORAL | 1 refills | Status: AC

## 2019-09-21 MED ORDER — MEPERIDINE HCL 50 MG/ML IJ SOLN
INTRAMUSCULAR | Status: AC
  Filled 2019-09-21: qty 1

## 2019-09-21 MED ORDER — SODIUM CHLORIDE 0.9 % IV SOLN
INTRAVENOUS | Status: DC
  Administered 2019-09-21: 11:00:00 1000 mL via INTRAVENOUS

## 2019-09-21 NOTE — Discharge Instructions (Signed)
Resume aspirin and Eliquis on 09/23/2019. Resume other medications as before. High-fiber diet. Driving for 24 hours. Physician will call with biopsy results.  Prescription for pantoprazole 40 mg p.o. daily sent to patient's pharmacy.   Colonoscopy, Adult, Care After This sheet gives you information about how to care for yourself after your procedure. Your doctor may also give you more specific instructions. If you have problems or questions, call your doctor. What can I expect after the procedure? After the procedure, it is common to have:  A small amount of blood in your poop (stool) for 24 hours.  Some gas.  Mild cramping or bloating in your belly (abdomen). Follow these instructions at home: Eating and drinking   Drink enough fluid to keep your pee (urine) pale yellow.  Follow instructions from your doctor about what you cannot eat or drink.  Return to your normal diet as told by your doctor. Avoid heavy or fried foods that are hard to digest. Activity  Rest as told by your doctor.  Do not sit for a long time without moving. Get up to take short walks every 1-2 hours. This is important. Ask for help if you feel weak or unsteady.  Return to your normal activities as told by your doctor. Ask your doctor what activities are safe for you. To help cramping and bloating:   Try walking around.  Put heat on your belly as told by your doctor. Use the heat source that your doctor recommends, such as a moist heat pack or a heating pad. ? Put a towel between your skin and the heat source. ? Leave the heat on for 20-30 minutes. ? Remove the heat if your skin turns bright red. This is very important if you are unable to feel pain, heat, or cold. You may have a greater risk of getting burned. General instructions  For the first 24 hours after the procedure: ? Do not drive or use machinery. ? Do not sign important documents. ? Do not drink alcohol. ? Do your daily activities more  slowly than normal. ? Eat foods that are soft and easy to digest.  Take over-the-counter or prescription medicines only as told by your doctor.  Keep all follow-up visits as told by your doctor. This is important. Contact a doctor if:  You have blood in your poop 2-3 days after the procedure. Get help right away if:  You have more than a small amount of blood in your poop.  You see large clumps of tissue (blood clots) in your poop.  Your belly is swollen.  You feel like you may vomit (nauseous).  You vomit.  You have a fever.  You have belly pain that gets worse, and medicine does not help your pain. Summary  After the procedure, it is common to have a small amount of blood in your poop. You may also have mild cramping and bloating in your belly.  For the first 24 hours after the procedure, do not drive or use machinery, do not sign important documents, and do not drink alcohol.  Get help right away if you have a lot of blood in your poop, feel like you may vomit, have a fever, or have more belly pain. This information is not intended to replace advice given to you by your health care provider. Make sure you discuss any questions you have with your health care provider. Document Revised: 11/22/2018 Document Reviewed: 11/22/2018 Elsevier Patient Education  [place].  Hemorrhoids Hemorrhoids  are swollen veins that may develop:  In the butt (rectum). These are called internal hemorrhoids.  Around the opening of the butt (anus). These are called external hemorrhoids. Hemorrhoids can cause pain, itching, or bleeding. Most of the time, they do not cause serious problems. They usually get better with diet changes, lifestyle changes, and other home treatments. What are the causes? This condition may be caused by:  Having trouble pooping (constipation).  Pushing hard (straining) to poop.  Watery poop (diarrhea).  Pregnancy.  Being very overweight  (obese).  Sitting for long periods of time.  Heavy lifting or other activity that causes you to strain.  Anal sex.  Riding a bike for a long period of time. What are the signs or symptoms? Symptoms of this condition include:  Pain.  Itching or soreness in the butt.  Bleeding from the butt.  Leaking poop.  Swelling in the area.  One or more lumps around the opening of your butt. How is this diagnosed? A doctor can often diagnose this condition by looking at the affected area. The doctor may also:  Do an exam that involves feeling the area with a gloved hand (digital rectal exam).  Examine the area inside your butt using a small tube (anoscope).  Order blood tests. This may be done if you have lost a lot of blood.  Have you get a test that involves looking inside the colon using a flexible tube with a camera on the end (sigmoidoscopy or colonoscopy). How is this treated? This condition can usually be treated at home. Your doctor may tell you to change what you eat, make lifestyle changes, or try home treatments. If these do not help, procedures can be done to remove the hemorrhoids or make them smaller. These may involve:  Placing rubber bands at the base of the hemorrhoids to cut off their blood supply.  Injecting medicine into the hemorrhoids to shrink them.  Shining a type of light energy onto the hemorrhoids to cause them to fall off.  Doing surgery to remove the hemorrhoids or cut off their blood supply. Follow these instructions at home: Eating and drinking   Eat foods that have a lot of fiber in them. These include whole grains, beans, nuts, fruits, and vegetables.  Ask your doctor about taking products that have added fiber (fibersupplements).  Reduce the amount of fat in your diet. You can do this by: ? Eating low-fat dairy products. ? Eating less red meat. ? Avoiding processed foods.  Drink enough fluid to keep your pee (urine) pale yellow. Managing  pain and swelling   Take a warm-water bath (sitz bath) for 20 minutes to ease pain. Do this 3-4 times a day. You may do this in a bathtub or using a portable sitz bath that fits over the toilet.  If told, put ice on the painful area. It may be helpful to use ice between your warm baths. ? Put ice in a plastic bag. ? Place a towel between your skin and the bag. ? Leave the ice on for 20 minutes, 2-3 times a day. General instructions  Take over-the-counter and prescription medicines only as told by your doctor. ? Medicated creams and medicines may be used as told.  Exercise often. Ask your doctor how much and what kind of exercise is best for you.  Go to the bathroom when you have the urge to poop. Do not wait.  Avoid pushing too hard when you poop.  Keep your butt  dry and clean. Use wet toilet paper or moist towelettes after pooping.  Do not sit on the toilet for a long time.  Keep all follow-up visits as told by your doctor. This is important. Contact a doctor if you:  Have pain and swelling that do not get better with treatment or medicine.  Have trouble pooping.  Cannot poop.  Have pain or swelling outside the area of the hemorrhoids. Get help right away if you have:  Bleeding that will not stop. Summary  Hemorrhoids are swollen veins in the butt or around the opening of the butt.  They can cause pain, itching, or bleeding.  Eat foods that have a lot of fiber in them. These include whole grains, beans, nuts, fruits, and vegetables.  Take a warm-water bath (sitz bath) for 20 minutes to ease pain. Do this 3-4 times a day. This information is not intended to replace advice given to you by your health care provider. Make sure you discuss any questions you have with your health care provider. Document Revised: 05/06/2018 Document Reviewed: 09/17/2017 Elsevier Patient Education  [place].  Diverticulosis  Diverticulosis is a condition that develops when small  pouches (diverticula) form in the wall of the large intestine (colon). The colon is where water is absorbed and stool (feces) is formed. The pouches form when the inside layer of the colon pushes through weak spots in the outer layers of the colon. You may have a few pouches or many of them. The pouches usually do not cause problems unless they become inflamed or infected. When this happens, the condition is called diverticulitis. What are the causes? The cause of this condition is not known. What increases the risk? The following factors may make you more likely to develop this condition:  Being older than age 61. Your risk for this condition increases with age. Diverticulosis is rare among people younger than age 20. By age 66, many people have it.  Eating a low-fiber diet.  Having frequent constipation.  Being overweight.  Not getting enough exercise.  Smoking.  Taking over-the-counter pain medicines, like aspirin and ibuprofen.  Having a family history of diverticulosis. What are the signs or symptoms? In most people, there are no symptoms of this condition. If you do have symptoms, they may include:  Bloating.  Cramps in the abdomen.  Constipation or diarrhea.  Pain in the lower left side of the abdomen. How is this diagnosed? Because diverticulosis usually has no symptoms, it is most often diagnosed during an exam for other colon problems. The condition may be diagnosed by:  Using a flexible scope to examine the colon (colonoscopy).  Taking an X-ray of the colon after dye has been put into the colon (barium enema).  Having a CT scan. How is this treated? You may not need treatment for this condition. Your health care provider may recommend treatment to prevent problems. You may need treatment if you have symptoms or if you previously had diverticulitis. Treatment may include:  Eating a high-fiber diet.  Taking a fiber supplement.  Taking a live bacteria supplement  (probiotic).  Taking medicine to relax your colon. Follow these instructions at home: Medicines  Take over-the-counter and prescription medicines only as told by your health care provider.  If told by your health care provider, take a fiber supplement or probiotic. Constipation prevention Your condition may cause constipation. To prevent or treat constipation, you may need to:  Drink enough fluid to keep your urine pale yellow.  Take over-the-counter or prescription medicines.  Eat foods that are high in fiber, such as beans, whole grains, and fresh fruits and vegetables.  Limit foods that are high in fat and processed sugars, such as fried or sweet foods.  General instructions  Try not to strain when you have a bowel movement.  Keep all follow-up visits as told by your health care provider. This is important. Contact a health care provider if you:  Have pain in your abdomen.  Have bloating.  Have cramps.  Have not had a bowel movement in 3 days. Get help right away if:  Your pain gets worse.  Your bloating becomes very bad.  You have a fever or chills, and your symptoms suddenly get worse.  You vomit.  You have bowel movements that are bloody or black.  You have bleeding from your rectum. Summary  Diverticulosis is a condition that develops when small pouches (diverticula) form in the wall of the large intestine (colon).  You may have a few pouches or many of them.  This condition is most often diagnosed during an exam for other colon problems.  Treatment may include increasing the fiber in your diet, taking supplements, or taking medicines. This information is not intended to replace advice given to you by your health care provider. Make sure you discuss any questions you have with your health care provider. Document Revised: 11/25/2018 Document Reviewed: 11/25/2018 Elsevier Patient Education  [place].  High-Fiber Diet Fiber, also called  dietary fiber, is a type of carbohydrate that is found in fruits, vegetables, whole grains, and beans. A high-fiber diet can have many health benefits. Your health care provider may recommend a high-fiber diet to help:  Prevent constipation. Fiber can make your bowel movements more regular.  Lower your cholesterol.  Relieve the following conditions: ? Swelling of veins in the anus (hemorrhoids). ? Swelling and irritation (inflammation) of specific areas of the digestive tract (uncomplicated diverticulosis). ? A problem of the large intestine (colon) that sometimes causes pain and diarrhea (irritable bowel syndrome, IBS).  Prevent overeating as part of a weight-loss plan.  Prevent heart disease, type 2 diabetes, and certain cancers. What is my plan? The recommended daily fiber intake in grams (g) includes:  38 g for men age 2 or younger.  30 g for men over age 68.  16 g for women age 76 or younger.  21 g for women over age 36. You can get the recommended daily intake of dietary fiber by:  Eating a variety of fruits, vegetables, grains, and beans.  Taking a fiber supplement, if it is not possible to get enough fiber through your diet. What do I need to know about a high-fiber diet?  It is better to get fiber through food sources rather than from fiber supplements. There is not a lot of research about how effective supplements are.  Always check the fiber content on the nutrition facts label of any prepackaged food. Look for foods that contain 5 g of fiber or more per serving.  Talk with a diet and nutrition specialist (dietitian) if you have questions about specific foods that are recommended or not recommended for your medical condition, especially if those foods are not listed below.  Gradually increase how much fiber you consume. If you increase your intake of dietary fiber too quickly, you may have bloating, cramping, or gas.  Drink plenty of water. Water helps you to digest  fiber. What are tips for following this plan?  Eat a wide variety of high-fiber foods.  Make sure that half of the grains that you eat each day are whole grains.  Eat breads and cereals that are made with whole-grain flour instead of refined flour or white flour.  Eat brown rice, bulgur wheat, or millet instead of white rice.  Start the day with a breakfast that is high in fiber, such as a cereal that contains 5 g of fiber or more per serving.  Use beans in place of meat in soups, salads, and pasta dishes.  Eat high-fiber snacks, such as berries, raw vegetables, nuts, and popcorn.  Choose whole fruits and vegetables instead of processed forms like juice or sauce. What foods can I eat?  Fruits Berries. Pears. Apples. Oranges. Avocado. Prunes and raisins. Dried figs. Vegetables Sweet potatoes. Spinach. Kale. Artichokes. Cabbage. Broccoli. Cauliflower. Green peas. Carrots. Squash. Grains Whole-grain breads. Multigrain cereal. Oats and oatmeal. Brown rice. Barley. Bulgur wheat. [place]. Quinoa. Bran muffins. Popcorn. Rye wafer crackers. Meats and other proteins Navy, kidney, and pinto beans. Soybeans. Split peas. Lentils. Nuts and seeds. Dairy Fiber-fortified yogurt. Beverages Fiber-fortified soy milk. Fiber-fortified orange juice. Other foods Fiber bars. The items listed above may not be a complete list of recommended foods and beverages. Contact a dietitian for more options. What foods are not recommended? Fruits Fruit juice. Cooked, strained fruit. Vegetables Fried potatoes. Canned vegetables. Well-cooked vegetables. Grains White bread. Pasta made with refined flour. White rice. Meats and other proteins Fatty cuts of meat. Fried chicken or fried fish. Dairy Milk. Yogurt. Cream cheese. Sour cream. Fats and oils Butters. Beverages Soft drinks. Other foods Cakes and pastries. The items listed above may not be a complete list of foods and beverages to avoid. Contact a  dietitian for more information. Summary  Fiber is a type of carbohydrate. It is found in fruits, vegetables, whole grains, and beans.  There are many health benefits of eating a high-fiber diet, such as preventing constipation, lowering blood cholesterol, helping with weight loss, and reducing your risk of heart disease, diabetes, and certain cancers.  Gradually increase your intake of fiber. Increasing too fast can result in cramping, bloating, and gas. Drink plenty of water while you increase your fiber.  The best sources of fiber include whole fruits and vegetables, whole grains, nuts, seeds, and beans. This information is not intended to replace advice given to you by your health care provider. Make sure you discuss any questions you have with your health care provider. Document Revised: 03/02/2017 Document Reviewed: 03/02/2017 Elsevier Patient Education  2020 [employer].

## 2019-09-21 NOTE — Op Note (Addendum)
[facility] Patient Name: Steven Scott Procedure Date: 09/21/2019 11:10 AM MRN: [id number] Date of Birth: 02-17-1946 Attending MD: Hildred Laser , MD CSN: [id number] Age: 74 Admit Type: Outpatient Procedure:                Colonoscopy Indications:              High risk colon cancer surveillance: Personal                            history of colonic polyps Providers:                Hildred Laser, MD, Otis Peak B. Sharon Seller, RN, Raphael Gibney, Technician Referring MD:             Halford Chessman, MD Medicines:                Meperidine 50 mg IV, Midazolam 5 mg IV Complications:            No immediate complications. Estimated Blood Loss:     Estimated blood loss was minimal. Procedure:                Pre-Anesthesia Assessment:                           - Prior to the procedure, a History and Physical                            was performed, and patient medications and                            allergies were reviewed. The patient's tolerance of                            previous anesthesia was also reviewed. The risks                            and benefits of the procedure and the sedation                            options and risks were discussed with the patient.                            All questions were answered, and informed consent                            was obtained. Prior Anticoagulants: The patient                            last took Eliquis (apixaban) 3 days prior to the                            procedure and has taken no previous anticoagulant  or antiplatelet agents except for aspirin. ASA                            Grade Assessment: III - A patient with severe                            systemic disease. After reviewing the risks and                            benefits, the patient was deemed in satisfactory                            condition to undergo the procedure.                           After  obtaining informed consent, the colonoscope                            was passed under direct vision. Throughout the                            procedure, the patient's blood pressure, pulse, and                            oxygen saturations were monitored continuously. The                            [id number]-[id number] ([id number]) scope was introduced through                            the anus and advanced to the the cecum, identified                            by appendiceal orifice and ileocecal valve. The                            colonoscopy was performed without difficulty. The                            patient tolerated the procedure well. The quality                            of the bowel preparation was good. The ileocecal                            valve, appendiceal orifice, and rectum were                            photographed. Scope In: 12:00:00 PM Scope Out: 12:28:51 PM Scope Withdrawal Time: 0 hours 23 minutes 41 seconds  Total Procedure Duration: 0 hours 28 minutes 51 seconds  Findings:      Skin tags were found on perianal exam.      Two polyps were found in the cecum. The polyps were diminutive in  size.       These were biopsied with a cold forceps for histology. The pathology       specimen was placed into Bottle Number 1.      A 6 to 10 mm polyp was found in the ascending colon. The polyp was       sessile. The polyp was removed with a cold snare. Polyp resection was       incomplete. The resected tissue was retrieved. Biopsies were taken with       a cold forceps for histology. The pathology specimen was placed into       Bottle Number 2. To close a defect after polypectomy, two hemostatic       clips were successfully placed (MR conditional). There was no bleeding       during, or at the end, of the procedure.      Multiple small and large-mouthed diverticula were found in the sigmoid       colon.      External hemorrhoids were found during retroflexion. The  hemorrhoids       were small. Impression:               - Perianal skin tags found on perianal exam.                           - Two diminutive polyps in the cecum. Biopsied.                           - One 6 to 10 mm polyp in the ascending colon,                            removed with a cold snare. Incomplete resection.                            Resected tissue retrieved. Biopsied. Clips (MR                            conditional) were placed.                           - Diverticulosis in the sigmoid colon.                           - External hemorrhoids. Moderate Sedation:      Moderate (conscious) sedation was administered by the endoscopy nurse       and supervised by the endoscopist. The following parameters were       monitored: oxygen saturation, heart rate, blood pressure, CO2       capnography and response to care. Total physician intraservice time was       33 minutes. Recommendation:           - Patient has a contact number available for                            emergencies. The signs and symptoms of potential                            delayed complications were discussed with the  patient. Return to normal activities tomorrow.                            Written discharge instructions were provided to the                            patient.                           - High fiber diet today.                           - Continue present medications.                           - Resume Eliquis (apixaban) in 2 days and previous                            antiplatelet medication in 2 days at prior doses.                           - Await pathology results.                           - Repeat colonoscopy is recommended. The                            colonoscopy date will be determined after pathology                            results from today's exam become available for                            review. Procedure Code(s):        --- Professional  ---                           [phone], Colonoscopy, flexible; with removal of                            tumor(s), polyp(s), or other lesion(s) by snare                            technique                           45380, 59, Colonoscopy, flexible; with biopsy,                            single or multiple                           99153, Moderate sedation; each additional 15                            minutes intraservice time  G0500, Moderate sedation services provided by the                            same physician or other qualified health care                            professional performing a gastrointestinal                            endoscopic service that sedation supports,                            requiring the presence of an independent trained                            observer to assist in the monitoring of the                            patient's level of consciousness and physiological                            status; initial 15 minutes of intra-service time;                            patient age 10 years or older (additional time may                            be reported with [phone], as appropriate) Diagnosis Code(s):        --- Professional ---                           K63.5, Polyp of colon                           K64.4, Residual hemorrhoidal skin tags                           Z86.010, Personal history of colonic polyps                           K57.30, Diverticulosis of large intestine without                            perforation or abscess without bleeding CPT copyright 2019 American [employer] Association. All rights reserved. The codes documented in this report are preliminary and upon coder review may  be revised to meet current compliance requirements. Hildred Laser, MD Hildred Laser, MD 09/21/2019 12:44:01 PM This report has been signed electronically. Number of Addenda: 0

## 2019-09-21 NOTE — H&P (Signed)
Steven Scott is an 74 y.o. male.   Chief Complaint: Patient is here for colonoscopy. HPI: Patient is 74 year old Caucasian male who has history of colonic adenomas and is here for surveillance colonoscopy.  His last exam was in August 2012 with removal of 3 tubular adenomas.  He did not return for follow-up exam in 5 years as recommended.  He has no GI symptoms.  He denies abdominal pain melena or rectal bleeding.  He tells me since his pantoprazole was discontinued he has been having heartburn and swallowing difficulty and he would like for me to renew that medication for him. Last Eliquis dose was 3 days ago. Family history is positive for CRC in mother who was in her 68s at the time of diagnosis.   Past Medical History:  Diagnosis Date  . Arthritis   . Atrial fibrillation ([place]) 03/2015  . Carotid artery occlusion   . Coronary artery disease   . GERD (gastroesophageal reflux disease)   . Headache   . History of bleeding ulcers   . Hyperlipidemia   . Hypertension   . Renal artery stenosis ([facility])    Multiple interventions  . Seasonal allergies   . Shortness of breath    "at any time" (12/13/2012)  . Status post percutaneous angioplasty of renal artery 10/18 2012   PV angiogram preformed revealing 90% right and 70% left in stent restenosis with 13mm and 74mm gradients respectively.    Past Surgical History:  Procedure Laterality Date  . CARDIAC CATHETERIZATION  03/02/2009   has moderate circumflex and diagonal branch disease with high-grade distal right disease and fail attempt at PCI  . COLONOSCOPY  12/18/2010   Procedure: COLONOSCOPY;  Surgeon: Rogene Houston, MD;  Location: [facility];  Service: Endoscopy;  Laterality: N/A;  . CORONARY ANGIOPLASTY WITH STENT PLACEMENT  03/28/2009   Successful PCI od high-grade 95% eccentric tandem plague stenosis in the distal right coronary artery with ultimate insertion of a 3.0 x 18 mm Xience V DES stent postdilated at 3.46 mm with the 99%  stenosis being reduced to 0%  . ENDARTERECTOMY Right 09/26/2015   Procedure: RIGHT CAROTID ARTERY ENDARTERECTOMY ;  Surgeon: Elam Dutch, MD;  Location: [facility] OR;  Service: Vascular;  Laterality: Right;  . ESOPHAGOGASTRODUODENOSCOPY N/A 11/08/2014   Procedure: ESOPHAGOGASTRODUODENOSCOPY (EGD);  Surgeon: Rogene Houston, MD;  Location: [facility];  Service: Endoscopy;  Laterality: N/A;  . ESOPHAGOGASTRODUODENOSCOPY N/A 03/15/2015   Procedure: ESOPHAGOGASTRODUODENOSCOPY (EGD);  Surgeon: Rogene Houston, MD;  Location: [facility];  Service: Endoscopy;  Laterality: N/A;  1255  . LITHOTRIPSY     "laser" (12/13/2012)  . PATCH ANGIOPLASTY Right 09/26/2015   Procedure: WITH HEMASHIELD PATCH ANGIOPLASTY;  Surgeon: Elam Dutch, MD;  Location: [place];  Service: Vascular;  Laterality: Right;  . PERCUTANEOUS STENT INTERVENTION Left 12/14/2012   Procedure: PERCUTANEOUS STENT INTERVENTION;  Surgeon: [name], MD;  Location: [facility] CATH LAB;  Service: Cardiovascular;  Laterality: Left;  left renal artery  . PERCUTANEOUS STENT INTERVENTION  02/16/2014   Procedure: PERCUTANEOUS STENT INTERVENTION;  Surgeon: [name], MD;  Location: [facility] CATH LAB;  Service: Cardiovascular;;  left renal  . RENAL ANGIOGRAM N/A 12/14/2012   Procedure: RENAL ANGIOGRAM;  Surgeon: [name], MD;  Location: [facility] CATH LAB;  Service: Cardiovascular;  Laterality: N/A;  . RENAL ANGIOGRAM N/A 02/16/2014   Procedure: RENAL ANGIOGRAM;  Surgeon: [name], MD;  Location: [facility] CATH LAB;  Service: Cardiovascular;  Laterality: N/A;  . RENAL ARTERY ANGIOPLASTY Bilateral 02/2014   PV restenting L RA with an ICast covered stent and cutting balloon atherectomy R RA  . RENAL ARTERY STENT Bilateral 2010    Family History  Problem Relation Age of Onset  . COPD Father 79       deceased   Social History:  reports that he quit smoking about 8 years ago. His smoking use included cigarettes. He has a 100.00 pack-year smoking  history. He has never used smokeless tobacco. He reports that he does not drink alcohol or use drugs.  Allergies:  Allergies  Allergen Reactions  . Penicillins Rash    Has patient had a PCN reaction causing immediate rash, facial/tongue/throat swelling, SOB or lightheadedness with hypotension: No Has patient had a PCN reaction causing severe rash involving mucus membranes or skin necrosis: No Has patient had a PCN reaction that required hospitalization No Has patient had a PCN reaction occurring within the last 10 years: No If all of the above answers are "NO", then may proceed with Cephalosporin use.    Medications Prior to Admission  Medication Sig Dispense Refill  . BYSTOLIC 5 MG tablet TAKE 1 TABLET BY MOUTH DAILY WITH BREAKFAST. 30 tablet 0  . diphenhydrAMINE (BENADRYL) 25 MG tablet Take 12.5 mg by mouth every 6 (six) hours as needed for allergies.    . Olmesartan-amLODIPine-HCTZ 40-10-25 MG TABS TAKE 1 TABLET BY MOUTH DAILY WITH BREAKFAST. 90 tablet 3  . simvastatin (ZOCOR) 20 MG tablet TAKE (1) TABLET BY MOUTH AT BEDTIME. 90 tablet 3  . acetaminophen (TYLENOL) 500 MG tablet Take 500 mg by mouth every 6 (six) hours as needed for moderate pain.    Marland Kitchen aspirin EC 81 MG tablet Take 1 tablet (81 mg total) by mouth daily. Start on 11/15/14 30 tablet 1  . cholecalciferol (VITAMIN D) 1000 UNITS tablet Take 1,000 Units by mouth daily.    . clobetasol cream (TEMOVATE) 9.62 % Apply 1 application topically daily as needed (Psoriasis).    Marland Kitchen ELIQUIS 5 MG TABS tablet TAKE (1) TABLET BY MOUTH TWICE DAILY. 180 tablet 1  . pantoprazole (PROTONIX) 40 MG tablet TAKE (1) TABLET BY MOUTH ONCE DAILY. 30 tablet 5    Results for orders placed or performed during the hospital encounter of 09/19/19 (from the past 48 hour(s))  SARS CORONAVIRUS 2 (TAT 6-24 HRS) Nasopharyngeal Nasopharyngeal Swab     Status: None   Collection Time: 09/19/19  2:05 PM   Specimen: Nasopharyngeal Swab  Result Value Ref Range   SARS  Coronavirus 2 NEGATIVE NEGATIVE    Comment: (NOTE) SARS-CoV-2 target nucleic acids are NOT DETECTED. The SARS-CoV-2 RNA is generally detectable in upper and lower respiratory specimens during the acute phase of infection. Negative results do not preclude SARS-CoV-2 infection, do not rule out co-infections with other pathogens, and should not be used as the sole basis for treatment or other patient management decisions. Negative results must be combined with clinical observations, patient history, and epidemiological information. The expected result is Negative. Fact Sheet for Patients: SugarRoll.be Fact Sheet for Healthcare Providers: https://www.woods-mathews.com/ This test is not yet approved or cleared by the [place] FDA and  has been authorized for detection and/or diagnosis of SARS-CoV-2 by FDA under an Emergency Use Authorization (EUA). This EUA will remain  in effect (meaning this test can be used) for the duration of the COVID-19 declaration under Section 56 4(b)(1) of the Act, 21 U.S.C. section 360bbb-3(b)(1), unless the authorization is  terminated or revoked sooner. Performed at [facility], [place] [address], [place], [place] [address]    No results found.  Review of Systems  Blood pressure 128/67, pulse 70, temperature 98.1 F (36.7 C), temperature source Oral, resp. rate 16, height 5\' 11"  (1.803 m), weight 115.7 kg, SpO2 95 %. Physical Exam  Constitutional: He appears well-developed and well-nourished.  HENT:  Mouth/Throat: Oropharynx is clear and moist.  Patient has  hearing impairment.  Eyes: Conjunctivae are normal. No scleral icterus.  Neck: No thyromegaly present.  Cardiovascular:  Irregular rhythm normal S1 and S2.  No murmur or gallop noted.  Respiratory: Effort normal and breath sounds normal.  GI:  Abdomen is full but soft and nontender with organomegaly or masses.  Musculoskeletal:        General: No  edema.  Lymphadenopathy:    He has no cervical adenopathy.  Neurological: He is alert.  Skin: Skin is warm and dry.     Assessment/Plan History of colonic adenomas. Family history of CRC in first-degree relative at late onset. Surveillance colonoscopy.  Hildred Laser, MD 09/21/2019, 11:50 AM

## 2019-09-22 LAB — SURGICAL PATHOLOGY

## 2019-10-10 DIAGNOSIS — I2510 Atherosclerotic heart disease of native coronary artery without angina pectoris: Secondary | ICD-10-CM | POA: Diagnosis not present

## 2019-10-10 DIAGNOSIS — Z87891 Personal history of nicotine dependence: Secondary | ICD-10-CM | POA: Diagnosis not present

## 2019-10-10 DIAGNOSIS — N184 Chronic kidney disease, stage 4 (severe): Secondary | ICD-10-CM | POA: Diagnosis not present

## 2019-10-10 DIAGNOSIS — I129 Hypertensive chronic kidney disease with stage 1 through stage 4 chronic kidney disease, or unspecified chronic kidney disease: Secondary | ICD-10-CM | POA: Diagnosis not present

## 2019-10-25 ENCOUNTER — Other Ambulatory Visit: Payer: Self-pay | Admitting: Cardiovascular Disease

## 2019-10-25 DIAGNOSIS — I701 Atherosclerosis of renal artery: Secondary | ICD-10-CM

## 2019-10-26 ENCOUNTER — Other Ambulatory Visit: Payer: Self-pay

## 2019-10-27 ENCOUNTER — Other Ambulatory Visit: Payer: Self-pay | Admitting: Cardiovascular Disease

## 2019-10-31 ENCOUNTER — Emergency Department (HOSPITAL_COMMUNITY)
Admission: EM | Admit: 2019-10-31 | Discharge: 2019-10-31 | Disposition: A | Discharge status: Home / Self Care | Payer: Medicare Other | Attending: Emergency Medicine | Admitting: Emergency Medicine

## 2019-10-31 ENCOUNTER — Encounter (HOSPITAL_COMMUNITY): Payer: Self-pay | Admitting: *Deleted

## 2019-10-31 ENCOUNTER — Emergency Department (HOSPITAL_COMMUNITY): Payer: Medicare Other

## 2019-10-31 ENCOUNTER — Other Ambulatory Visit: Payer: Self-pay

## 2019-10-31 DIAGNOSIS — X58XXXA Exposure to other specified factors, initial encounter: Secondary | ICD-10-CM | POA: Diagnosis not present

## 2019-10-31 DIAGNOSIS — R0989 Other specified symptoms and signs involving the circulatory and respiratory systems: Secondary | ICD-10-CM | POA: Diagnosis not present

## 2019-10-31 DIAGNOSIS — Z79899 Other long term (current) drug therapy: Secondary | ICD-10-CM | POA: Diagnosis not present

## 2019-10-31 DIAGNOSIS — Z87891 Personal history of nicotine dependence: Secondary | ICD-10-CM | POA: Insufficient documentation

## 2019-10-31 DIAGNOSIS — M5136 Other intervertebral disc degeneration, lumbar region: Secondary | ICD-10-CM | POA: Diagnosis not present

## 2019-10-31 DIAGNOSIS — Y999 Unspecified external cause status: Secondary | ICD-10-CM | POA: Diagnosis not present

## 2019-10-31 DIAGNOSIS — R52 Pain, unspecified: Secondary | ICD-10-CM

## 2019-10-31 DIAGNOSIS — I129 Hypertensive chronic kidney disease with stage 1 through stage 4 chronic kidney disease, or unspecified chronic kidney disease: Secondary | ICD-10-CM | POA: Insufficient documentation

## 2019-10-31 DIAGNOSIS — M5117 Intervertebral disc disorders with radiculopathy, lumbosacral region: Secondary | ICD-10-CM | POA: Diagnosis not present

## 2019-10-31 DIAGNOSIS — N183 Chronic kidney disease, stage 3 unspecified: Secondary | ICD-10-CM | POA: Diagnosis not present

## 2019-10-31 DIAGNOSIS — M545 Low back pain: Secondary | ICD-10-CM | POA: Diagnosis not present

## 2019-10-31 DIAGNOSIS — M79671 Pain in right foot: Secondary | ICD-10-CM | POA: Diagnosis not present

## 2019-10-31 DIAGNOSIS — M25552 Pain in left hip: Secondary | ICD-10-CM | POA: Diagnosis not present

## 2019-10-31 DIAGNOSIS — M79672 Pain in left foot: Secondary | ICD-10-CM | POA: Diagnosis present

## 2019-10-31 DIAGNOSIS — Y9289 Other specified places as the place of occurrence of the external cause: Secondary | ICD-10-CM | POA: Insufficient documentation

## 2019-10-31 DIAGNOSIS — S92355A Nondisplaced fracture of fifth metatarsal bone, left foot, initial encounter for closed fracture: Secondary | ICD-10-CM | POA: Insufficient documentation

## 2019-10-31 DIAGNOSIS — R238 Other skin changes: Secondary | ICD-10-CM | POA: Insufficient documentation

## 2019-10-31 DIAGNOSIS — R0602 Shortness of breath: Secondary | ICD-10-CM | POA: Diagnosis not present

## 2019-10-31 DIAGNOSIS — M5416 Radiculopathy, lumbar region: Secondary | ICD-10-CM | POA: Insufficient documentation

## 2019-10-31 DIAGNOSIS — I2510 Atherosclerotic heart disease of native coronary artery without angina pectoris: Secondary | ICD-10-CM | POA: Insufficient documentation

## 2019-10-31 DIAGNOSIS — I517 Cardiomegaly: Secondary | ICD-10-CM | POA: Diagnosis not present

## 2019-10-31 DIAGNOSIS — R600 Localized edema: Secondary | ICD-10-CM | POA: Diagnosis not present

## 2019-10-31 DIAGNOSIS — M5137 Other intervertebral disc degeneration, lumbosacral region: Secondary | ICD-10-CM

## 2019-10-31 DIAGNOSIS — Y9389 Activity, other specified: Secondary | ICD-10-CM | POA: Insufficient documentation

## 2019-10-31 DIAGNOSIS — S92352A Displaced fracture of fifth metatarsal bone, left foot, initial encounter for closed fracture: Secondary | ICD-10-CM | POA: Diagnosis not present

## 2019-10-31 DIAGNOSIS — M25551 Pain in right hip: Secondary | ICD-10-CM | POA: Diagnosis not present

## 2019-10-31 LAB — CBC WITH DIFFERENTIAL/PLATELET
Abs Immature Granulocytes: 0.04 10*3/uL (ref 0.00–0.07)
Basophils Absolute: 0.1 10*3/uL (ref 0.0–0.1)
Basophils Relative: 1 %
Eosinophils Absolute: 0.3 10*3/uL (ref 0.0–0.5)
Eosinophils Relative: 3 %
HCT: 50.9 % (ref 39.0–52.0)
Hemoglobin: 16 g/dL (ref 13.0–17.0)
Immature Granulocytes: 0 %
Lymphocytes Relative: 12 %
Lymphs Abs: 1.3 10*3/uL (ref 0.7–4.0)
MCH: 30.2 pg (ref 26.0–34.0)
MCHC: 31.4 g/dL (ref 30.0–36.0)
MCV: 96.2 fL (ref 80.0–100.0)
Monocytes Absolute: 0.9 10*3/uL (ref 0.1–1.0)
Monocytes Relative: 8 %
Neutro Abs: 8 10*3/uL — ABNORMAL HIGH (ref 1.7–7.7)
Neutrophils Relative %: 76 %
Platelets: 241 10*3/uL (ref 150–400)
RBC: 5.29 MIL/uL (ref 4.22–5.81)
RDW: 14 % (ref 11.5–15.5)
WBC: 10.6 10*3/uL — ABNORMAL HIGH (ref 4.0–10.5)
nRBC: 0 % (ref 0.0–0.2)

## 2019-10-31 LAB — BASIC METABOLIC PANEL
Anion gap: 11 (ref 5–15)
BUN: 38 mg/dL — ABNORMAL HIGH (ref 8–23)
CO2: 28 mmol/L (ref 22–32)
Calcium: 9.3 mg/dL (ref 8.9–10.3)
Chloride: 102 mmol/L (ref 98–111)
Creatinine, Ser: 1.89 mg/dL — ABNORMAL HIGH (ref 0.61–1.24)
GFR calc Af Amer: 39 mL/min — ABNORMAL LOW (ref >60)
GFR calc non Af Amer: 34 mL/min — ABNORMAL LOW (ref >60)
Glucose, Bld: 111 mg/dL — ABNORMAL HIGH (ref 70–99)
Potassium: 3.9 mmol/L (ref 3.5–5.1)
Sodium: 141 mmol/L (ref 135–145)

## 2019-10-31 LAB — BRAIN NATRIURETIC PEPTIDE: B Natriuretic Peptide: 192 pg/mL — ABNORMAL HIGH (ref 0.0–100.0)

## 2019-10-31 MED ORDER — PREDNISONE 50 MG PO TABS
ORAL_TABLET | ORAL | 0 refills | Status: DC
Start: 2019-10-31 — End: 2020-02-16

## 2019-10-31 MED ORDER — LIDOCAINE 5 % EX PTCH
1.0000 | MEDICATED_PATCH | CUTANEOUS | Status: DC
  Administered 2019-10-31: 1 via TRANSDERMAL
  Filled 2019-10-31: qty 1

## 2019-10-31 MED ORDER — DEXAMETHASONE SODIUM PHOSPHATE 10 MG/ML IJ SOLN
10.0000 mg | Freq: Once | INTRAMUSCULAR | Status: AC
  Administered 2019-10-31: 10 mg via INTRAMUSCULAR
  Filled 2019-10-31: qty 1

## 2019-10-31 MED ORDER — LIDOCAINE 5 % EX PTCH: MEDICATED_PATCH | CUTANEOUS | 0 refills | Status: DC

## 2019-10-31 NOTE — ED Provider Notes (Signed)
[facility] EMERGENCY DEPARTMENT Provider Note   CSN: [id number] Arrival date & time: 10/31/19  1353     History Chief Complaint  Patient presents with  . Hip Pain  . Foot Pain    Steven Scott is a 74 y.o. male history of atrial fibrillation on Eliquis, CAD, GERD, hypertension, renal artery stenosis status post stenting presenting for evaluation of increasing weakness, peripheral edema and pain in his bilateral hips which radiate into his bilateral feet, left greater than right.  He describes several falls secondary to lower extremity pain, but no fall in recent weeks.  He describes a burning numbness that radiates into his feet. He endorses increasing left dorsal foot pain and redness along with edema.  He was seen by his twice with these complaints and has been treated with pain medication which was briefly helpful and is also anticipating home physical therapy to help with his suspected chronic arthritis pain.  He denies chest pain.  Denies fevers.  He does have a chronic nonproductive cough and does endorse orthopnea.  He has found no alleviators for symptoms.  The history is provided by the patient and a relative.       Past Medical History:  Diagnosis Date  . Arthritis   . Atrial fibrillation ([place]) 03/2015  . Carotid artery occlusion   . Coronary artery disease   . GERD (gastroesophageal reflux disease)   . Headache   . History of bleeding ulcers   . Hyperlipidemia   . Hypertension   . Renal artery stenosis ([facility])    Multiple interventions  . Seasonal allergies   . Shortness of breath    "at any time" (12/13/2012)  . Status post percutaneous angioplasty of renal artery 10/18 2012   PV angiogram preformed revealing 90% right and 70% left in stent restenosis with 69mm and 53mm gradients respectively.    Patient Active Problem List   Diagnosis Date Noted  . Asymptomatic stenosis of right carotid artery 09/26/2015  . Bilateral carotid artery disease ([place]) 06/26/2015    . Chronic atrial fibrillation ([place]) 03/26/2015  . Bleeding gastrointestinal   . CKD (chronic kidney disease) stage 3, GFR 30-59 ml/min 11/08/2014  . Anemia 11/08/2014  . GI bleed 11/07/2014  . HLD (hyperlipidemia) 11/07/2014  . Abdominal pain 11/07/2014  . Constipation 11/07/2014  . Elevated troponin 11/07/2014  . Obesity 01/31/2014  . Renal artery stenosis ([place]) 12/08/2012  . CAD -S/P RCA DES 2010 11/05/2012  . Essential hypertension 11/05/2012  . Hyperlipidemia     Past Surgical History:  Procedure Laterality Date  . CARDIAC CATHETERIZATION  03/02/2009   has moderate circumflex and diagonal branch disease with high-grade distal right disease and fail attempt at PCI  . COLONOSCOPY  12/18/2010   Procedure: COLONOSCOPY;  Surgeon: Rogene Houston, MD;  [facility];  Service: Endoscopy;  Laterality: N/A;  . COLONOSCOPY N/A 09/21/2019   Procedure: COLONOSCOPY;  Surgeon: Rogene Houston, MD;  [facility];  Service: Endoscopy;  Laterality: N/A;  1210  . CORONARY ANGIOPLASTY WITH STENT PLACEMENT  03/28/2009   Successful PCI od high-grade 95% eccentric tandem plague stenosis in the distal right coronary artery with ultimate insertion of a 3.0 x 18 mm Xience V DES stent postdilated at 3.46 mm with the 99% stenosis being reduced to 0%  . ENDARTERECTOMY Right 09/26/2015   Procedure: RIGHT CAROTID ARTERY ENDARTERECTOMY ;  Surgeon: Elam Dutch, MD;  Location: [place];  Service: Vascular;  Laterality: Right;  .  ESOPHAGOGASTRODUODENOSCOPY N/A 11/08/2014   Procedure: ESOPHAGOGASTRODUODENOSCOPY (EGD);  Surgeon: Rogene Houston, MD;  [facility];  Service: Endoscopy;  Laterality: N/A;  . ESOPHAGOGASTRODUODENOSCOPY N/A 03/15/2015   Procedure: ESOPHAGOGASTRODUODENOSCOPY (EGD);  Surgeon: Rogene Houston, MD;  [facility];  Service: Endoscopy;  Laterality: N/A;  1255  . LITHOTRIPSY     "laser" (12/13/2012)  . PATCH ANGIOPLASTY Right 09/26/2015   Procedure:  WITH HEMASHIELD PATCH ANGIOPLASTY;  Surgeon: Elam Dutch, MD;  Location: [place];  Service: Vascular;  Laterality: Right;  . PERCUTANEOUS STENT INTERVENTION Left 12/14/2012   Procedure: PERCUTANEOUS STENT INTERVENTION;  Surgeon: [name], MD;  Location: [facility] CATH LAB;  Service: Cardiovascular;  Laterality: Left;  left renal artery  . PERCUTANEOUS STENT INTERVENTION  02/16/2014   Procedure: PERCUTANEOUS STENT INTERVENTION;  Surgeon: [name], MD;  Location: [facility] CATH LAB;  Service: Cardiovascular;;  left renal  . POLYPECTOMY  09/21/2019   Procedure: POLYPECTOMY;  Surgeon: Rogene Houston, MD;  [facility];  Service: Endoscopy;;  . RENAL ANGIOGRAM N/A 12/14/2012   Procedure: RENAL ANGIOGRAM;  Surgeon: [name], MD;  Location: [facility] CATH LAB;  Service: Cardiovascular;  Laterality: N/A;  . RENAL ANGIOGRAM N/A 02/16/2014   Procedure: RENAL ANGIOGRAM;  Surgeon: [name], MD;  Location[facility] CATH LAB;  Service: Cardiovascular;  Laterality: N/A;  . RENAL ARTERY ANGIOPLASTY Bilateral 02/2014   PV restenting L RA with an ICast covered stent and cutting balloon atherectomy R RA  . RENAL ARTERY STENT Bilateral 2010       Family History  Problem Relation Age of Onset  . COPD Father 75       deceased    Social History   Tobacco Use  . Smoking status: Former Smoker    Packs/day: 2.00    Years: 50.00    Pack years: 100.00    Types: Cigarettes    Quit date: 02/24/2011    Years since quitting: 8.6  . Smokeless tobacco: Never Used  Vaping Use  . Vaping Use: Never used  Substance Use Topics  . Alcohol use: No    Alcohol/week: 0.0 standard drinks    Comment: 12/13/2012 "last alcohol was ~ 4 yr ago"  . Drug use: No    Home Medications Prior to Admission medications   Medication Sig Start Date End Date Taking? Authorizing Provider  acetaminophen (TYLENOL 8 HOUR ARTHRITIS PAIN) 650 MG CR tablet Take 325-650 mg by mouth daily as needed for pain.   Yes [provider]  acetaminophen (TYLENOL) 500 MG tablet Take 500 mg by mouth in the morning and at bedtime. For back pain   Yes [provider]  apixaban (ELIQUIS) 5 MG TABS tablet 1 tablet (5 mg total) 2 (two) times daily. 09/23/19  Yes Rehman, Mechele Dawley, MD  aspirin EC 81 MG tablet Take 1 tablet (81 mg total) by mouth daily. Start on 11/15/14 Patient taking differently: Take 81 mg by mouth every morning. Start on 11/15/14 09/23/19  Yes Rehman, Mechele Dawley, MD  BYSTOLIC 5 MG tablet TAKE 1 TABLET BY MOUTH DAILY WITH BREAKFAST. Patient taking differently: Take 5 mg by mouth every morning.  10/27/19  Yes [name], MD  clobetasol cream (TEMOVATE) 1.60 % Apply 1 application topically daily as needed (Psoriasis).   Yes [provider]  diphenhydrAMINE (BENADRYL) 25 MG tablet Take 25 mg by mouth at bedtime.    Yes [provider]  Olmesartan-amLODIPine-HCTZ 40-10-25 MG  TABS TAKE 1 TABLET BY MOUTH DAILY WITH BREAKFAST. Patient taking differently: Take 1 tablet by mouth every morning.  06/10/19  Yes [name], MD  pantoprazole (PROTONIX) 40 MG tablet Take 1 tablet (40 mg total) by mouth daily before breakfast. 09/21/19  Yes Rehman, Mechele Dawley, MD  simvastatin (ZOCOR) 20 MG tablet TAKE (1) TABLET BY MOUTH AT BEDTIME. Patient taking differently: Take 20 mg by mouth every evening.  06/10/19  Yes [name], MD  lidocaine (LIDODERM) 5 % Remove & Discard patch within 12 hours or as directed by MD Apply to your mid lower back 10/31/19   Gradie Butrick, Almyra Free, PA-C  predniSONE (DELTASONE) 50 MG tablet Take 1 tablet daily for 4 days 10/31/19   Evalee Jefferson, PA-C    Allergies    Penicillins  Review of Systems   Review of Systems  Constitutional: Negative for chills and fever.  HENT: Negative for congestion and sore throat.   Eyes: Negative.   Respiratory: Negative for chest tightness.   Cardiovascular: Positive for leg swelling. Negative for chest pain and palpitations.    Gastrointestinal: Negative for abdominal pain and nausea.  Genitourinary: Negative.   Musculoskeletal: Positive for arthralgias and back pain. Negative for joint swelling, neck pain and neck stiffness.  Skin: Positive for color change. Negative for rash and wound.  Neurological: Negative for dizziness, weakness, light-headedness, numbness and headaches.  Psychiatric/Behavioral: Negative.     Physical Exam Updated Vital Signs BP (!) 156/82 (BP Location: Left Arm)   Pulse 86   Temp 98.2 F (36.8 C) (Oral)   Resp 20   Ht 5\' 11"  (1.803 m)   Wt 116.1 kg   SpO2 100%   BMI 35.70 kg/m   Physical Exam Vitals and nursing note reviewed.  Constitutional:      Appearance: He is well-developed.  HENT:     Head: Normocephalic and atraumatic.  Eyes:     Conjunctiva/sclera: Conjunctivae normal.  Cardiovascular:     Rate and Rhythm: Normal rate and regular rhythm.     Pulses:          Dorsalis pedis pulses are 2+ on the right side and 2+ on the left side.     Heart sounds: Normal heart sounds.  Pulmonary:     Effort: Pulmonary effort is normal.     Breath sounds: Rales present. No wheezing.     Comments: Bilateral lower rales. Abdominal:     General: Bowel sounds are normal.     Palpations: Abdomen is soft.     Tenderness: There is no abdominal tenderness.  Musculoskeletal:        General: Normal range of motion.     Cervical back: Normal range of motion.     Right lower leg: 2+ Edema present.     Left lower leg: 1+ Edema present.  Skin:    General: Skin is warm and dry.     Comments: Small area of erythema on the left dorsal foot.  No increased warmth.  Neurological:     Mental Status: He is alert.     ED Results / Procedures / Treatments   Labs (all labs ordered are listed, but only abnormal results are displayed) Labs Reviewed  BASIC METABOLIC PANEL - Abnormal; Notable for the following components:      Result Value   Glucose, Bld 111 (*)    BUN 38 (*)    Creatinine,  Ser 1.89 (*)    GFR calc non Af Amer 34 (*)  GFR calc Af Amer 39 (*)    All other components within normal limits  CBC WITH DIFFERENTIAL/PLATELET - Abnormal; Notable for the following components:   WBC 10.6 (*)    Neutro Abs 8.0 (*)    All other components within normal limits  BRAIN NATRIURETIC PEPTIDE - Abnormal; Notable for the following components:   B Natriuretic Peptide 192.0 (*)    All other components within normal limits    EKG None  Radiology DG Lumbar Spine Complete  Result Date: 10/31/2019 CLINICAL DATA:  Back pain EXAM: LUMBAR SPINE - COMPLETE 4+ VIEW COMPARISON:  02/03/2006 FINDINGS: Lumbar alignment within normal limits. Vertebral body heights are maintained. Diffuse degenerative changes throughout the lumbar spine with moderate disc space narrowing and vacuum disc at L3-L4 and L4-L5 and advanced disc space narrowing at L5-S1. Facet degenerative changes of the lower lumbar spine. Vascular calcifications with left renal stent. IMPRESSION: Diffuse degenerative changes. No acute osseous abnormality. Electronically Signed   By: Donavan Foil M.D.   On: 10/31/2019 19:17   DG Chest Port 1 View  Result Date: 10/31/2019 CLINICAL DATA:  Shortness of breath EXAM: PORTABLE CHEST 1 VIEW COMPARISON:  11/07/2014 FINDINGS: Mild cardiomegaly. No focal opacity or pleural effusion. Aortic atherosclerosis. No pneumothorax. IMPRESSION: Borderline to mild cardiomegaly without overt edema or focal pulmonary opacity. Electronically Signed   By: Donavan Foil M.D.   On: 10/31/2019 19:15   DG Foot Complete Left  Result Date: 10/31/2019 CLINICAL DATA:  Bilateral foot pain. EXAM: LEFT FOOT - COMPLETE 3+ VIEW COMPARISON:  None. FINDINGS: A mildly displaced fracture is seen involving the base of the fifth left metatarsal. This is of indeterminate age. There is no evidence of dislocation. There is no evidence of arthropathy. A large plantar calcaneal spur is seen. Soft tissues are unremarkable.  IMPRESSION: Mildly displaced fracture involving the base of the fifth left metatarsal of indeterminate age. Electronically Signed   By: Virgina Norfolk M.D.   On: 10/31/2019 16:17   DG Foot Complete Right  Result Date: 10/31/2019 CLINICAL DATA:  Bilateral foot pain. EXAM: RIGHT FOOT COMPLETE - 3+ VIEW COMPARISON:  None. FINDINGS: There is no evidence of fracture or dislocation. There is no evidence of significant arthropathy. A large plantar calcaneal spur is seen. Soft tissues are unremarkable. IMPRESSION: Large plantar calcaneal spur. Electronically Signed   By: Virgina Norfolk M.D.   On: 10/31/2019 16:19   DG Hip Unilat With Pelvis 2-3 Views Left  Result Date: 10/31/2019 CLINICAL DATA:  Bilateral hip pain and foot pain. EXAM: DG HIP (WITH OR WITHOUT PELVIS) 2-3V LEFT COMPARISON:  None. FINDINGS: There is no evidence of hip fracture or dislocation. Mild degenerative changes seen involving the left hip. Mild vascular calcification is seen. IMPRESSION: Mild degenerative changes involving the left hip. Electronically Signed   By: Virgina Norfolk M.D.   On: 10/31/2019 16:14   DG Hip Unilat  With Pelvis 2-3 Views Right  Result Date: 10/31/2019 CLINICAL DATA:  Bilateral hip pain and foot pain. EXAM: DG HIP (WITH OR WITHOUT PELVIS) 2-3V RIGHT COMPARISON:  None. FINDINGS: There is no evidence of hip fracture or dislocation. Mild to moderate severity degenerative changes seen involving the right hip. Mild vascular calcification is noted. IMPRESSION: Mild to moderate severity degenerative changes involving the right hip. Electronically Signed   By: Virgina Norfolk M.D.   On: 10/31/2019 16:15    Procedures Procedures (including critical care time)  Medications Ordered in ED Medications  lidocaine (LIDODERM) 5 % 1  patch (1 patch Transdermal Patch Applied 10/31/19 2220)  dexamethasone (DECADRON) injection 10 mg (10 mg Intramuscular Given 10/31/19 2220)    ED Course  I have reviewed the triage  vital signs and the nursing notes.  Pertinent labs & imaging results that were available during my care of the patient were reviewed by me and considered in my medical decision making (see chart for details).    MDM Rules/Calculators/A&P                          Patient presenting with significant orthopedic pain including low back, hips radiating into his feet causing difficulty with movement and activities of daily living.  He has no neurologic findings on his exam and no complaints of urinary or fecal retention or incontinence.  Imaging tonight suggesting significant lumbar degenerative disc changes, he would benefit from a neurosurgical consultation and he was given this referral.  Additionally he mentioned difficulty with ambulating and home activities secondary to pain.  He has been anticipating home evaluation for physical therapy which has not occurred yet.  A referral was given from here for PT home eval per face-to-face.  He was advised to anticipate a call from PT here to further assess him in his home.  He was given an IM dose of Decadron here along with a prednisone pulse dose for home use.  Lidoderm patch for application to his lumbar spine.  Review of labs with no new findings, he does have creatinine elevation which is chronic.  He also has a subacute fracture at the base of his left fifth metatarsal bone.  He was placed in a postop shoe for this.  Referral to Dr. Alvan Dame for ongoing management of this injury.  Of note, on his exam he did have bilateral rales on his lung exam.  His chest x-ray was without effusion or edema and his BNP was barely elevated.  This does not appear to be acute CHF.  He does endorse that his breathing is stable for him not worsened today.  Patient was seen by Dr. Kathrynn Humble during this visit. Final Clinical Impression(s) / ED Diagnoses Final diagnoses:  Pain aggravated by activities of daily living  Closed nondisplaced fracture of fifth metatarsal bone of left  foot, initial encounter  DDD (degenerative disc disease), lumbosacral  Lumbar radiculopathy    Rx / DC Orders ED Discharge Orders         [place]  Reprint     10/31/19 2047    Face-to-face encounter (required for Medicare/Medicaid patients)     Discontinue  Reprint    Comments: I Evalee Jefferson certify that this patient is under my care and that I, or a nurse practitioner or physician's assistant working with me, had a face-to-face encounter that meets the physician face-to-face encounter requirements with this patient on 10/31/2019. The encounter with the patient was in whole, or in part for the following medical condition(s) which is the primary reason for home health care (List medical condition): low back pain with pain and numbness into feet causing difficulty walking.   10/31/19 2047    predniSONE (DELTASONE) 50 MG tablet     Discontinue  Reprint     10/31/19 2220    lidocaine (LIDODERM) 5 %     Discontinue  Reprint     10/31/19 2220           Evalee Jefferson, PA-C 10/31/19  [place], [place], MD 11/01/19 1514

## 2019-10-31 NOTE — Discharge Instructions (Addendum)
The x-rays of your lumbar spine suggest that you do have significant degenerative arthritis like changes which could definitely explain the pain in your legs and numbness in your feet.  You would benefit from seeing a neurosurgeon who is Dr. Arnoldo Morale, call his office tomorrow for an appointment.  Additionally you have been referred to Dr. Alvan Dame who is an orthopedist who can follow your care as your foot fracture heals.  Wear the postop shoe to help protect this bone while it continues to heal.  Wear this at all times while awake, standing and walking to protect this bone.  Take the medications prescribed including the pain patch applied to your mid lower back which may help a lot with your discomfort.  We have also placed an order for home evaluation for physical therapy to help you with your home needs including strengthening.  Expect a phone call within the next 1 to 2 days to arrange a home visit for this.

## 2019-10-31 NOTE — ED Triage Notes (Signed)
Bilateral hip and foot pain since FEB 2021

## 2019-11-08 DIAGNOSIS — E559 Vitamin D deficiency, unspecified: Secondary | ICD-10-CM | POA: Diagnosis not present

## 2019-11-08 DIAGNOSIS — I129 Hypertensive chronic kidney disease with stage 1 through stage 4 chronic kidney disease, or unspecified chronic kidney disease: Secondary | ICD-10-CM | POA: Diagnosis not present

## 2019-11-08 DIAGNOSIS — E211 Secondary hyperparathyroidism, not elsewhere classified: Secondary | ICD-10-CM | POA: Diagnosis not present

## 2019-11-08 DIAGNOSIS — R809 Proteinuria, unspecified: Secondary | ICD-10-CM | POA: Diagnosis not present

## 2019-11-09 DIAGNOSIS — N184 Chronic kidney disease, stage 4 (severe): Secondary | ICD-10-CM | POA: Diagnosis not present

## 2019-11-09 DIAGNOSIS — I129 Hypertensive chronic kidney disease with stage 1 through stage 4 chronic kidney disease, or unspecified chronic kidney disease: Secondary | ICD-10-CM | POA: Diagnosis not present

## 2019-11-09 DIAGNOSIS — Z87891 Personal history of nicotine dependence: Secondary | ICD-10-CM | POA: Diagnosis not present

## 2019-11-09 DIAGNOSIS — I2510 Atherosclerotic heart disease of native coronary artery without angina pectoris: Secondary | ICD-10-CM | POA: Diagnosis not present

## 2019-11-15 DIAGNOSIS — M5441 Lumbago with sciatica, right side: Secondary | ICD-10-CM | POA: Diagnosis not present

## 2019-11-15 DIAGNOSIS — G8929 Other chronic pain: Secondary | ICD-10-CM | POA: Diagnosis not present

## 2019-11-18 DIAGNOSIS — E211 Secondary hyperparathyroidism, not elsewhere classified: Secondary | ICD-10-CM | POA: Diagnosis not present

## 2019-11-18 DIAGNOSIS — R809 Proteinuria, unspecified: Secondary | ICD-10-CM | POA: Diagnosis not present

## 2019-11-18 DIAGNOSIS — I129 Hypertensive chronic kidney disease with stage 1 through stage 4 chronic kidney disease, or unspecified chronic kidney disease: Secondary | ICD-10-CM | POA: Diagnosis not present

## 2019-11-25 DIAGNOSIS — M79672 Pain in left foot: Secondary | ICD-10-CM | POA: Diagnosis not present

## 2019-11-25 DIAGNOSIS — S92351A Displaced fracture of fifth metatarsal bone, right foot, initial encounter for closed fracture: Secondary | ICD-10-CM | POA: Diagnosis not present

## 2019-11-25 DIAGNOSIS — R6889 Other general symptoms and signs: Secondary | ICD-10-CM | POA: Diagnosis not present

## 2019-11-28 ENCOUNTER — Other Ambulatory Visit: Payer: Self-pay | Admitting: Cardiovascular Disease

## 2019-11-28 DIAGNOSIS — I482 Chronic atrial fibrillation, unspecified: Secondary | ICD-10-CM

## 2019-12-07 ENCOUNTER — Other Ambulatory Visit: Payer: Self-pay | Admitting: Neurosurgery

## 2019-12-07 ENCOUNTER — Other Ambulatory Visit (HOSPITAL_COMMUNITY): Payer: Self-pay | Admitting: Neurosurgery

## 2019-12-07 DIAGNOSIS — G8929 Other chronic pain: Secondary | ICD-10-CM

## 2019-12-28 ENCOUNTER — Ambulatory Visit (HOSPITAL_COMMUNITY)
Admission: RE | Admit: 2019-12-28 | Discharge: 2019-12-28 | Disposition: A | Discharge status: Home / Self Care | Payer: Medicare Other | Source: Ambulatory Visit | Attending: Neurosurgery | Admitting: Neurosurgery

## 2019-12-28 ENCOUNTER — Other Ambulatory Visit: Payer: Self-pay

## 2019-12-28 DIAGNOSIS — M5441 Lumbago with sciatica, right side: Secondary | ICD-10-CM | POA: Insufficient documentation

## 2019-12-28 DIAGNOSIS — M545 Low back pain: Secondary | ICD-10-CM | POA: Diagnosis not present

## 2019-12-28 DIAGNOSIS — G8929 Other chronic pain: Secondary | ICD-10-CM | POA: Insufficient documentation

## 2020-01-06 DIAGNOSIS — I10 Essential (primary) hypertension: Secondary | ICD-10-CM | POA: Diagnosis not present

## 2020-01-06 DIAGNOSIS — M5126 Other intervertebral disc displacement, lumbar region: Secondary | ICD-10-CM | POA: Diagnosis not present

## 2020-01-06 DIAGNOSIS — M5416 Radiculopathy, lumbar region: Secondary | ICD-10-CM | POA: Diagnosis not present

## 2020-01-06 DIAGNOSIS — R6889 Other general symptoms and signs: Secondary | ICD-10-CM | POA: Diagnosis not present

## 2020-01-06 DIAGNOSIS — M48062 Spinal stenosis, lumbar region with neurogenic claudication: Secondary | ICD-10-CM | POA: Diagnosis not present

## 2020-01-11 ENCOUNTER — Telehealth: Payer: Self-pay

## 2020-01-11 ENCOUNTER — Other Ambulatory Visit: Payer: Self-pay | Admitting: Neurosurgery

## 2020-01-11 NOTE — Telephone Encounter (Signed)
follow up:      Patient returning call back. Please call back

## 2020-01-11 NOTE — Telephone Encounter (Signed)
   [facility] Pre-operative Risk Assessment    Request for surgical clearance:  1. What type of surgery is being performed? LUMBAR LAMINECTOMY   2. When is this surgery scheduled? TBD   3. What type of clearance is required (medical clearance vs. Pharmacy clearance to hold med vs. Both)? BOTH  4. Are there any medications that need to be held prior to surgery and how long? NOT LISTED-PT TAKES ELIQUIS   5. Practice name and name of physician performing surgery? [facility] JEFFREY JENKINS ATTN:[id number]   6. What is the office phone number? [phone]   7.   What is the office fax number? [phone]  8.   Anesthesia type (None, local, MAC, general) ? GENERAL

## 2020-01-11 NOTE — Telephone Encounter (Signed)
Patient with diagnosis of A Fib on Eliquis for anticoagulation.    Procedure: LUMBAR LAMINECTOMY  Date of procedure: TBD  CHADS2-VASc score of  4  (HTN, AGE x 2, CAD)  CrCl 56 mL/min  Per office protocol, patient can hold Eliquis for 3 days prior to procedure.

## 2020-01-11 NOTE — Telephone Encounter (Addendum)
   Primary Cardiologist: Quay Burow, MD  Chart reviewed as part of pre-operative protocol coverage. Patient was contacted 01/11/2020 in reference to pre-operative risk assessment for pending surgery as outlined below.  Steven Scott was last seen on 06/02/19 by Dr. Gwenlyn Found with history of CAD with remote PCI, PAD, renal artery and carotid intervention, HLD, HTN, chronic atrial fibrillation, CKD with last Cr 1.89. EF normal in 2017. RCRI 0.9% indicating low risk of CV complications. Need to speak with pt to discuss how he is doing. No answer - LMCTB.   Will route to pharm for input on anticoag and await patient callback.   Charlie Pitter, PA-C 01/11/2020, 9:04 AM

## 2020-01-11 NOTE — Telephone Encounter (Addendum)
   Primary Cardiologist: Quay Burow, MD  Chart reviewed as part of pre-operative protocol coverage. Patient returned call and denies any recent concerning cardiac symptoms with physical exertion. His main issue limiting his activity is chronic back pain issues but he denies any interim changes in his cardiac status. Given past medical history and time since last visit, based on ACC/AHA guidelines, Steven Scott would be at acceptable risk for the planned procedure without further cardiovascular testing.   The patient was advised that if he develops new symptoms prior to surgery to contact our office to arrange for a follow-up visit, and he verbalized understanding.  Per office protocol, patient can hold Eliquis for 3 days prior to procedure.   I will route this recommendation to the requesting party via Epic fax function and remove from pre-op pool.  Please call with questions.  Charlie Pitter, PA-C 01/11/2020, 11:40 AM

## 2020-01-12 DIAGNOSIS — E211 Secondary hyperparathyroidism, not elsewhere classified: Secondary | ICD-10-CM | POA: Diagnosis not present

## 2020-01-12 DIAGNOSIS — I129 Hypertensive chronic kidney disease with stage 1 through stage 4 chronic kidney disease, or unspecified chronic kidney disease: Secondary | ICD-10-CM | POA: Diagnosis not present

## 2020-01-12 DIAGNOSIS — R809 Proteinuria, unspecified: Secondary | ICD-10-CM | POA: Diagnosis not present

## 2020-01-18 ENCOUNTER — Ambulatory Visit (INDEPENDENT_AMBULATORY_CARE_PROVIDER_SITE_OTHER): Payer: Medicare Other

## 2020-01-18 ENCOUNTER — Other Ambulatory Visit: Payer: Self-pay

## 2020-01-18 DIAGNOSIS — I701 Atherosclerosis of renal artery: Secondary | ICD-10-CM | POA: Diagnosis not present

## 2020-01-26 DIAGNOSIS — Z01818 Encounter for other preprocedural examination: Secondary | ICD-10-CM | POA: Diagnosis not present

## 2020-01-26 DIAGNOSIS — E211 Secondary hyperparathyroidism, not elsewhere classified: Secondary | ICD-10-CM | POA: Diagnosis not present

## 2020-01-26 DIAGNOSIS — R809 Proteinuria, unspecified: Secondary | ICD-10-CM | POA: Diagnosis not present

## 2020-01-26 DIAGNOSIS — I129 Hypertensive chronic kidney disease with stage 1 through stage 4 chronic kidney disease, or unspecified chronic kidney disease: Secondary | ICD-10-CM | POA: Diagnosis not present

## 2020-01-27 ENCOUNTER — Other Ambulatory Visit: Payer: Self-pay | Admitting: Neurosurgery

## 2020-02-10 NOTE — Pre-Procedure Instructions (Signed)
[place], [place] [address] Phone: [phone] Fax: [phone]  [place], [place], [address] [place], [address] [place] [address] Phone: [phone] Fax: [phone]      Your procedure is scheduled on Wednesday, October 6th from 2:27p.m.- 4:20p.m.  Report to [facility] Main Entrance "A" at 12:20 P.M., and check in at the Admitting office.  Call this number if you have problems the morning of surgery:  [phone]  Call [phone] if you have any questions prior to your surgery date Monday-Friday 8am-4pm    Remember:  Do not eat or drink after midnight the night before your surgery    Take these medicines the morning of surgery with A SIP OF WATER  acetaminophen (TYLENOL) pantoprazole (PROTONIX) BYSTOLIC   Per your cardiologist, HOLD ELIQUIS 3 days prior to surgery.   As of today, STOP taking any Aspirin (unless otherwise instructed by your surgeon) Aleve, Naproxen, Ibuprofen, Motrin, Advil, Goody's, BC's, all herbal medications, fish oil, and all vitamins.                      Do not wear jewelry.            Do not wear lotions, powders, colognes, or deodorant.            Men may shave face and neck.            Do not bring valuables to the hospital.            [facility] is not responsible for any belongings or valuables.  Do NOT Smoke (Tobacco/Vaping) or drink Alcohol 24 hours prior to your procedure If you use a CPAP at night, you may bring all equipment for your overnight stay.   Contacts, glasses, dentures or bridgework may not be worn into surgery.      For patients admitted to the hospital, discharge time will be determined by your treatment team.   Patients discharged the day of surgery will not be allowed to drive home, and someone needs to stay with them for 24 hours.    Special instructions:   [facility]- Preparing For  Surgery  Before surgery, you can play an important role. Because skin is not sterile, your skin needs to be as free of germs as possible. You can reduce the number of germs on your skin by washing with CHG (chlorahexidine gluconate) Soap before surgery.  CHG is an antiseptic cleaner which kills germs and bonds with the skin to continue killing germs even after washing.    Oral Hygiene is also important to reduce your risk of infection.  Remember - BRUSH YOUR TEETH THE MORNING OF SURGERY WITH YOUR REGULAR TOOTHPASTE  Please do not use if you have an allergy to CHG or antibacterial soaps. If your skin becomes reddened/irritated stop using the CHG.  Do not shave (including legs and underarms) for at least 48 hours prior to first CHG shower. It is OK to shave your face.  Please follow these instructions carefully.   1. Shower the NIGHT BEFORE SURGERY and the MORNING OF SURGERY with CHG Soap.   2. If you chose to wash your hair, wash your hair first as usual with your normal shampoo.  3. After you shampoo, rinse your hair and body thoroughly to remove the shampoo.  4. Use CHG as you would any other  liquid soap. You can apply CHG directly to the skin and wash gently with a scrungie or a clean washcloth.   5. Apply the CHG Soap to your body ONLY FROM THE NECK DOWN.  Do not use on open wounds or open sores. Avoid contact with your eyes, ears, mouth and genitals (private parts). Wash Face and genitals (private parts)  with your normal soap.   6. Wash thoroughly, paying special attention to the area where your surgery will be performed.  7. Thoroughly rinse your body with warm water from the neck down.  8. DO NOT shower/wash with your normal soap after using and rinsing off the CHG Soap.  9. Pat yourself dry with a CLEAN TOWEL.  10. Wear CLEAN PAJAMAS to bed the night before surgery  11. Place CLEAN SHEETS on your bed the night of your first shower and DO NOT SLEEP WITH PETS.   Day of  Surgery: Complete second shower as stated above. Wear Clean/Comfortable clothing the morning of surgery Do not apply any deodorants/lotions.   Remember to brush your teeth WITH YOUR REGULAR TOOTHPASTE.   Please read over the following fact sheets that you were given.

## 2020-02-13 ENCOUNTER — Other Ambulatory Visit (HOSPITAL_COMMUNITY)
Admission: RE | Admit: 2020-02-13 | Discharge: 2020-02-13 | Disposition: A | Discharge status: Home / Self Care | Payer: Medicare Other | Source: Ambulatory Visit | Attending: Neurosurgery | Admitting: Neurosurgery

## 2020-02-13 ENCOUNTER — Encounter (HOSPITAL_COMMUNITY)
Admission: RE | Admit: 2020-02-13 | Discharge: 2020-02-13 | Disposition: A | Discharge status: Home / Self Care | Payer: Medicare Other | Source: Ambulatory Visit | Attending: Neurosurgery | Admitting: Neurosurgery

## 2020-02-13 ENCOUNTER — Other Ambulatory Visit: Payer: Self-pay

## 2020-02-13 ENCOUNTER — Encounter (HOSPITAL_COMMUNITY): Payer: Self-pay

## 2020-02-13 DIAGNOSIS — Z20822 Contact with and (suspected) exposure to covid-19: Secondary | ICD-10-CM | POA: Insufficient documentation

## 2020-02-13 DIAGNOSIS — Z01812 Encounter for preprocedural laboratory examination: Secondary | ICD-10-CM | POA: Insufficient documentation

## 2020-02-13 HISTORY — DX: Personal history of urinary calculi: Z87.442

## 2020-02-13 HISTORY — DX: Chronic kidney disease, unspecified: N18.9

## 2020-02-13 HISTORY — DX: Pneumonia, unspecified organism: J18.9

## 2020-02-13 LAB — CBC
HCT: 50.3 % (ref 39.0–52.0)
Hemoglobin: 15.9 g/dL (ref 13.0–17.0)
MCH: 29.7 pg (ref 26.0–34.0)
MCHC: 31.6 g/dL (ref 30.0–36.0)
MCV: 93.8 fL (ref 80.0–100.0)
Platelets: 206 10*3/uL (ref 150–400)
RBC: 5.36 MIL/uL (ref 4.22–5.81)
RDW: 14.6 % (ref 11.5–15.5)
WBC: 9.3 10*3/uL (ref 4.0–10.5)
nRBC: 0 % (ref 0.0–0.2)

## 2020-02-13 LAB — SURGICAL PCR SCREEN
MRSA, PCR: NEGATIVE
Staphylococcus aureus: NEGATIVE

## 2020-02-13 LAB — BASIC METABOLIC PANEL
Anion gap: 11 (ref 5–15)
BUN: 26 mg/dL — ABNORMAL HIGH (ref 8–23)
CO2: 25 mmol/L (ref 22–32)
Calcium: 9.6 mg/dL (ref 8.9–10.3)
Chloride: 104 mmol/L (ref 98–111)
Creatinine, Ser: 1.88 mg/dL — ABNORMAL HIGH (ref 0.61–1.24)
GFR calc Af Amer: 40 mL/min — ABNORMAL LOW (ref >60)
GFR calc non Af Amer: 34 mL/min — ABNORMAL LOW (ref >60)
Glucose, Bld: 105 mg/dL — ABNORMAL HIGH (ref 70–99)
Potassium: 4.2 mmol/L (ref 3.5–5.1)
Sodium: 140 mmol/L (ref 135–145)

## 2020-02-13 LAB — SARS CORONAVIRUS 2 (TAT 6-24 HRS): SARS Coronavirus 2: NEGATIVE

## 2020-02-13 NOTE — Progress Notes (Signed)
PCP - Dr. Hilma Favors  Cardiologist - Dr. Sharmaine Base- C.C. 01/11/20 (E)  Chest x-ray - 10/31/19 (E) 1 view  EKG - 05/31/19 (E)  Stress Test - 09/20/15 (E)  ECHO - 09/20/15 (E)  Cardiac Cath - 03/28/09 (E) stent x1  AICD-na PM-na LOOP-na  Sleep Study - Yes- Negative CPAP - None  LABS- 02/13/20: CBC, BMP, PCR, COVID  ASA-LD- 10/2 Eliquis- LD- 10/2  ERAS- No  HA1C- Denies  Anesthesia- Yes- cardiac history  Pt denies having chest pain, sob, or fever at this time. All instructions explained to the pt, with a verbal understanding of the material. Pt agrees to go over the instructions while at home for a better understanding. Pt also instructed to self quarantine after being tested for COVID-19. The opportunity to ask questions was provided.   Coronavirus Screening  Have you experienced the following symptoms:  Cough yes/no: No Fever (>100.44F)  yes/no: No Runny nose yes/no: No Sore throat yes/no: No Difficulty breathing/shortness of breath  yes/no: No  Have you or a family member traveled in the last 14 days and where? yes/no: No   If the patient indicates "YES" to the above questions, their PAT will be rescheduled to limit the exposure to others and, the surgeon will be notified. THE PATIENT WILL NEED TO BE ASYMPTOMATIC FOR 14 DAYS.   If the patient is not experiencing any of these symptoms, the PAT nurse will instruct them to NOT bring anyone with them to their appointment since they may have these symptoms or traveled as well.   Please remind your patients and families that hospital visitation restrictions are in effect and the importance of the restrictions.

## 2020-02-13 NOTE — Anesthesia Preprocedure Evaluation (Addendum)
Anesthesia Evaluation  Patient identified by MRN, date of birth, ID band Patient awake    Reviewed: Allergy & Precautions, NPO status , Patient's Chart, lab work & pertinent test results  Airway Mallampati: II  TM Distance: >3 FB Neck ROM: Full    Dental no notable dental hx. (+) Dental Advisory Given, Edentulous Upper,    Pulmonary neg pulmonary ROS, former smoker,    Pulmonary exam normal breath sounds clear to auscultation       Cardiovascular hypertension, Pt. on medications + CAD and + Peripheral Vascular Disease  + dysrhythmias Atrial Fibrillation  Rhythm:Regular Rate:Normal  EKG: 05/31/19: Ventricular rate 84 bpm Atrial fib with premature ventricular or aberrantly conducted complexes Septal infarct, age undetermined   ECHO 5/17  Impressions: LVEF 60-65%, mild LVH, trivial MR, normal LA size, trivial TR, RVSP 33 mmHg, normal IVC. A-fib or a-flutter was noted in the exam.  Nuclear stress test 09/20/15:  Study Highlights  Normal stress nuclear study with no ischemia or infarction; study not gated due to atrial fibrillation.   Neuro/Psych  Headaches, negative psych ROS   GI/Hepatic Neg liver ROS, GERD  Medicated,  Endo/Other  negative endocrine ROS  Renal/GU CRFRenal disease  negative genitourinary   Musculoskeletal  (+) Arthritis , Osteoarthritis,    Abdominal   Peds negative pediatric ROS (+)  Hematology  (+) Blood dyscrasia, anemia ,   Anesthesia Other Findings   Reproductive/Obstetrics negative OB ROS                          Anesthesia Physical Anesthesia Plan  ASA: III  Anesthesia Plan: General   Post-op Pain Management:    Induction: Intravenous  PONV Risk Score and Plan: 2 and Ondansetron, Dexamethasone and Treatment may vary due to age or medical condition  Airway Management Planned: Oral ETT  Additional Equipment: None  Intra-op Plan:   Post-operative Plan:  Extubation in OR  Informed Consent:   Plan Discussed with: CRNA, Surgeon and Anesthesiologist  Anesthesia Plan Comments: (PAT note written 02/13/2020 by Myra Gianotti, PA-C. )      Anesthesia Quick Evaluation

## 2020-02-13 NOTE — Progress Notes (Signed)
Anesthesia Chart Review:  Case: [id number] Date/Time: 02/15/20 1412   Procedure: BILATERAL LUMBAR 3- LUMBAR 4 LAMINOTOMY, FORAMINOTOMY, LEFT LUMBAR 3- LUMBAR 4 DISKECTOMY (Bilateral ) - 3C   Anesthesia type: General   Pre-op diagnosis: SPINAL STENOSIS, LUMBAR REGION WITH NEUROGENIC CLAUDICATION   Location: [facility] OR ROOM 19 / [place] OR   Surgeons: Newman Pies, MD      DISCUSSION: Patient is a 74 year old male scheduled for the above procedure.  History includes former smoker (quit 02/24/11), CAD (s/p DES RCA 03/28/09), atrial fibrillation (on Eliquis), HTN, CKD (stage 3B), renal artery stenosis (bilateral renal artery stenting 03/02/09; left 2014, right 2015; interval occlusion right RA 11/21/16), carotid artery stenosis (s/p right carotid endarterectomy 09/26/15), HLD, GERD, dyspnea, gastric ulcer (2016). BMI is consistent with obesity.   Preoperative cardiology risk assessment outlined on 01/11/20 by Melina Copa, PA-C: "Patient returned call and denies any recent concerning cardiac symptoms with physical exertion. His main issue limiting his activity is chronic back pain issues but he denies any interim changes in his cardiac status. Given past medical history and time since last visit, based on ACC/AHA guidelines, KAIDAN HARPSTER would be at acceptable risk for the planned procedure without further cardiovascular testing.   The patient was advised that if he develops new symptoms prior to surgery to contact our office to arrange for a follow-up visit, and he verbalized understanding.  Per office protocol, patient can holdEliquisfor 3days prior to procedure."  He reported last aspirin and Eliquis doses for surgery on 02/11/20.    Preoperative nephrology clearance by Dr. Theador Hawthorne from 01/26/20 visit: "preoperative nephrological clearance Patient is at moderate risk for AKI as patient has CKD stage 3b- patient GFR is at 30 mils per minute We will suggest to avoid NSAIDs We will suggest to avoid  intraoperative hypotension We will suggest to hold ARB/hydrochlorothiazide combination a day before and the day after the surgery."   Preoperative cardiology and nephrology input as outlined above.  He denied chest pain. shortness of breath, cough, fever at PAT RN visit. Creatinine stable at 1.88.  02/13/2020 presurgical COVID-19 test in process.  Anesthesia team will evaluate on the day of surgery.   VS: BP (!) 157/68   Pulse 77   Temp 36.6 C (Oral)   Resp 19   Ht 5\' 11"  (1.803 m)   Wt 116 kg   SpO2 98%   BMI 35.68 kg/m    PROVIDERS: Sharilyn Sites, MD is PCP - Quay Burow, MD is cardiologist. Last visit 05/31/19. No chest pain or SOB. On Eliquis for afib. Continue annual renal duplex studies. - Theador Hawthorne, Manpreet, MBBS is nephrologist ([facility]) - Ruta Hinds, MD is vascular surgeon. Last visit 02/2018 with 1-39% BICA stenosis.   LABS: Labs reviewed: Acceptable for surgery. (all labs ordered are listed, but only abnormal results are displayed)  Labs Reviewed  BASIC METABOLIC PANEL - Abnormal; Notable for the following components:      Result Value   Glucose, Bld 105 (*)    BUN 26 (*)    Creatinine, Ser 1.88 (*)    GFR calc non Af Amer 34 (*)    GFR calc Af Amer 40 (*)    All other components within normal limits  SURGICAL PCR SCREEN  CBC    OTHER: Colonoscopy 09/21/19: - Perianal skin tags found on perianal exam. - Two diminutive polyps in the cecum. Biopsied. - One 6 to 10 mm polyp in the ascending colon,  removed  with a cold snare. Incomplete resection.  Resected tissue retrieved. Biopsied. Clips (MR  conditional) were placed. - Diverticulosis in the sigmoid colon. - External hemorrhoids. - Pathology: cecum: tubular adenoma without high grade dysplasia; ascending colon: sessile serrated polyp without cytologic dysplasia (X multiple)   IMAGES: MRI L-spine 12/28/19: IMPRESSION: 1. Symptomatic level favored  to be L3-L4 where a bulky left paracentral disc extrusion contributes to severe left lateral recess stenosis, moderate to severe spinal stenosis, and moderate left foraminal stenosis. 2. Additional advanced chronic disc and endplate degeneration at L4-L5 and L5-S1, with moderate to severe bilateral L4 and L5 neural foraminal stenosis. 3. Interbody ankylosis in the lower thoracic spine.  1V PCXR 10/31/19: FINDINGS: Mild cardiomegaly. No focal opacity or pleural effusion. Aortic atherosclerosis. No pneumothorax. IMPRESSION: Borderline to mild cardiomegaly without overt edema or focal pulmonary opacity.   EKG: 05/31/19: Ventricular rate 84 bpm Atrial fib with premature ventricular or aberrantly conducted complexes Septal infarct, age undetermined   CV: Renal US 01/18/20: Summary:  Renal:  Left: Normal size of left kidney. Abnormal cortical thickness of the    left kidney. Evidence of a > 60% stenosis in the left renal    artery. LRV flow present. Cyst(s) noted. Poorly visualized.    Suggest alternate imaging modality.  (Interval occlusion of right renal artery 11/21/16 [place])  Carotid US 02/15/18: Final Interpretation:  - Right Carotid: Velocities in the right ICA are consistent with a 1-39%  stenosis. Non-hemodynamically significant plaque <50% noted in the  CCA.  - Left Carotid: Velocities in the left ICA are consistent with a 1-39%  stenosis. Non-hemodynamically significant plaque noted in the CCA.  - Vertebrals: Bilateral vertebral arteries demonstrate antegrade flow.  - Subclavians: Normal flow hemodynamics were seen in bilateral subclavian  arteries.  Nuclear stress test 09/20/15:  Study Highlights  Normal stress nuclear study with no ischemia or infarction; study not gated due to atrial fibrillation.  Echo 09/20/15:  Study Conclusions - Procedure narrative: Transthoracic echocardiography. Image quality was fair. The study was technically difficult, as a  result of body habitus. The cardiac rhythm appears to be afib or a-flutter. - Left ventricle: The cavity size was normal. Wall thickness was increased in a pattern of mild LVH. Systolic function was normal. The estimated ejection fraction was in the range of 60% to 65%. The study is not technically sufficient to allow evaluation of LV diastolic function. - Mitral valve: Mildly thickened leaflets . There was trivial regurgitation. - Left atrium: The atrium was normal in size. - Atrial septum: No defect or patent foramen ovale was identified. - Tricuspid valve: There was trivial regurgitation. - Pulmonary arteries: PA peak pressure: 33 mm Hg (S). - Inferior vena cava: The vessel was normal in size. The respirophasic diameter changes were in the normal range (>= 50%), consistent with normal central venous pressure. - Impressions: LVEF 60-65%, mild LVH, trivial MR, normal LA size, trivial TR, RVSP 33 mmHg, normal IVC. A-fib or a-flutter was noted in the exam.   Past Medical History:  Diagnosis Date  . Arthritis   . Atrial fibrillation ([place]) 03/2015  . Carotid artery occlusion   . CKD (chronic kidney disease)   . Coronary artery disease   . GERD (gastroesophageal reflux disease)   . Headache   . History of bleeding ulcers   . History of kidney stones   . Hyperlipidemia   . Hypertension   . Pneumonia   . Renal artery stenosis ([facility])    Multiple interventions  .  Seasonal allergies   . Shortness of breath    "at any time" (12/13/2012)  . Status post percutaneous angioplasty of renal artery 10/18 2012   PV angiogram preformed revealing 90% right and 70% left in stent restenosis with 20mm and 20mm gradients respectively.    Past Surgical History:  Procedure Laterality Date  . CARDIAC CATHETERIZATION  03/02/2009   has moderate circumflex and diagonal branch disease with high-grade distal right disease and fail attempt at PCI  . COLONOSCOPY  12/18/2010   Procedure: COLONOSCOPY;  Surgeon:  Rogene Houston, MD;  [facility];  Service: Endoscopy;  Laterality: N/A;  . COLONOSCOPY N/A 09/21/2019   Procedure: COLONOSCOPY;  Surgeon: Rogene Houston, MD;  [facility];  Service: Endoscopy;  Laterality: N/A;  1210  . CORONARY ANGIOPLASTY WITH STENT PLACEMENT  03/28/2009   Successful PCI od high-grade 95% eccentric tandem plague stenosis in the distal right coronary artery with ultimate insertion of a 3.0 x 18 mm Xience V DES stent postdilated at 3.46 mm with the 99% stenosis being reduced to 0%  . ENDARTERECTOMY Right 09/26/2015   Procedure: RIGHT CAROTID ARTERY ENDARTERECTOMY ;  Surgeon: Elam Dutch, MD;  [facility] OR;  Service: Vascular;  Laterality: Right;  . ESOPHAGOGASTRODUODENOSCOPY N/A 11/08/2014   Procedure: ESOPHAGOGASTRODUODENOSCOPY (EGD);  Surgeon: Rogene Houston, MD;  [facility];  Service: Endoscopy;  Laterality: N/A;  . ESOPHAGOGASTRODUODENOSCOPY N/A 03/15/2015   Procedure: ESOPHAGOGASTRODUODENOSCOPY (EGD);  Surgeon: Rogene Houston, MD;  [facility];  Service: Endoscopy;  Laterality: N/A;  1255  . LITHOTRIPSY     "laser" (12/13/2012)  . PATCH ANGIOPLASTY Right 09/26/2015   Procedure: WITH HEMASHIELD PATCH ANGIOPLASTY;  Surgeon: Elam Dutch, MD;  Location: [facility];  Service: Vascular;  Laterality: Right;  . PERCUTANEOUS STENT INTERVENTION Left 12/14/2012   Procedure: PERCUTANEOUS STENT INTERVENTION;  Surgeon: [name], MD;  Location: [facility] CATH LAB;  Service: Cardiovascular;  Laterality: Left;  left renal artery  . PERCUTANEOUS STENT INTERVENTION  02/16/2014   Procedure: PERCUTANEOUS STENT INTERVENTION;  Surgeon: [name], MD;  Location: [name] CATH LAB;  Service: Cardiovascular;;  left renal  . POLYPECTOMY  09/21/2019   Procedure: POLYPECTOMY;  Surgeon: Rogene Houston, MD;  [facility];  Service: Endoscopy;;  . RENAL ANGIOGRAM N/A 12/14/2012   Procedure: RENAL ANGIOGRAM;  Surgeon: [name], MD;   Location: [facility] CATH LAB;  Service: Cardiovascular;  Laterality: N/A;  . RENAL ANGIOGRAM N/A 02/16/2014   Procedure: RENAL ANGIOGRAM;  Surgeon: [name], MD;  Location: [facility] CATH LAB;  Service: Cardiovascular;  Laterality: N/A;  . RENAL ARTERY ANGIOPLASTY Bilateral 02/2014   PV restenting L RA with an ICast covered stent and cutting balloon atherectomy R RA  . RENAL ARTERY STENT Bilateral 2010    MEDICATIONS: . acetaminophen (TYLENOL 8 HOUR ARTHRITIS PAIN) 650 MG CR tablet  . acetaminophen (TYLENOL) 500 MG tablet  . aspirin EC 81 MG tablet  . BYSTOLIC 5 MG tablet  . clobetasol cream (TEMOVATE) 0.05 %  . diphenhydrAMINE (BENADRYL) 25 MG tablet  . ELIQUIS 5 MG TABS tablet  . lidocaine (LIDODERM) 5 %  . Olmesartan-amLODIPine-HCTZ 40-10-25 MG TABS  . pantoprazole (PROTONIX) 40 MG tablet  . predniSONE (DELTASONE) 50 MG tablet  . simvastatin (ZOCOR) 20 MG tablet   No current facility-administered medications for this encounter.  Prednisone was for only 4 days, not currently taking.   Myra Gianotti, PA-C Surgical Short  Stay/Anesthesiology [facility] Phone [phone] [facility] Phone [phone] 02/13/2020 4:08 PM

## 2020-02-15 ENCOUNTER — Encounter (HOSPITAL_COMMUNITY)
Admission: RE | Disposition: A | Discharge status: Home / Self Care | Payer: Self-pay | Source: Home / Self Care | Attending: Neurosurgery

## 2020-02-15 ENCOUNTER — Ambulatory Visit (HOSPITAL_COMMUNITY)
Admission: RE | Admit: 2020-02-15 | Discharge: 2020-02-16 | Disposition: A | Discharge status: Home / Self Care | Payer: Medicare Other | Attending: Neurosurgery | Admitting: Neurosurgery

## 2020-02-15 ENCOUNTER — Other Ambulatory Visit: Payer: Self-pay

## 2020-02-15 ENCOUNTER — Ambulatory Visit (HOSPITAL_COMMUNITY): Payer: Medicare Other

## 2020-02-15 ENCOUNTER — Ambulatory Visit (HOSPITAL_COMMUNITY): Payer: Medicare Other | Admitting: Anesthesiology

## 2020-02-15 ENCOUNTER — Ambulatory Visit (HOSPITAL_COMMUNITY): Payer: Medicare Other | Admitting: Vascular Surgery

## 2020-02-15 ENCOUNTER — Encounter (HOSPITAL_COMMUNITY): Payer: Self-pay | Admitting: Neurosurgery

## 2020-02-15 DIAGNOSIS — I739 Peripheral vascular disease, unspecified: Secondary | ICD-10-CM | POA: Diagnosis not present

## 2020-02-15 DIAGNOSIS — Z79899 Other long term (current) drug therapy: Secondary | ICD-10-CM | POA: Diagnosis not present

## 2020-02-15 DIAGNOSIS — N189 Chronic kidney disease, unspecified: Secondary | ICD-10-CM | POA: Insufficient documentation

## 2020-02-15 DIAGNOSIS — M48062 Spinal stenosis, lumbar region with neurogenic claudication: Secondary | ICD-10-CM | POA: Diagnosis present

## 2020-02-15 DIAGNOSIS — Z981 Arthrodesis status: Secondary | ICD-10-CM | POA: Diagnosis not present

## 2020-02-15 DIAGNOSIS — Z7901 Long term (current) use of anticoagulants: Secondary | ICD-10-CM | POA: Diagnosis not present

## 2020-02-15 DIAGNOSIS — Z955 Presence of coronary angioplasty implant and graft: Secondary | ICD-10-CM | POA: Insufficient documentation

## 2020-02-15 DIAGNOSIS — Z7982 Long term (current) use of aspirin: Secondary | ICD-10-CM | POA: Insufficient documentation

## 2020-02-15 DIAGNOSIS — I129 Hypertensive chronic kidney disease with stage 1 through stage 4 chronic kidney disease, or unspecified chronic kidney disease: Secondary | ICD-10-CM | POA: Diagnosis not present

## 2020-02-15 DIAGNOSIS — I2510 Atherosclerotic heart disease of native coronary artery without angina pectoris: Secondary | ICD-10-CM | POA: Insufficient documentation

## 2020-02-15 DIAGNOSIS — K219 Gastro-esophageal reflux disease without esophagitis: Secondary | ICD-10-CM | POA: Insufficient documentation

## 2020-02-15 DIAGNOSIS — Z87891 Personal history of nicotine dependence: Secondary | ICD-10-CM | POA: Insufficient documentation

## 2020-02-15 DIAGNOSIS — I4891 Unspecified atrial fibrillation: Secondary | ICD-10-CM | POA: Diagnosis not present

## 2020-02-15 DIAGNOSIS — E785 Hyperlipidemia, unspecified: Secondary | ICD-10-CM | POA: Diagnosis not present

## 2020-02-15 DIAGNOSIS — Z880 Allergy status to penicillin: Secondary | ICD-10-CM | POA: Insufficient documentation

## 2020-02-15 DIAGNOSIS — M5116 Intervertebral disc disorders with radiculopathy, lumbar region: Secondary | ICD-10-CM | POA: Insufficient documentation

## 2020-02-15 DIAGNOSIS — I482 Chronic atrial fibrillation, unspecified: Secondary | ICD-10-CM | POA: Diagnosis not present

## 2020-02-15 DIAGNOSIS — Z419 Encounter for procedure for purposes other than remedying health state, unspecified: Secondary | ICD-10-CM

## 2020-02-15 HISTORY — PX: LUMBAR LAMINECTOMY/DECOMPRESSION MICRODISCECTOMY: SHX5026

## 2020-02-15 SURGERY — LUMBAR LAMINECTOMY/DECOMPRESSION MICRODISCECTOMY 1 LEVEL
Anesthesia: General | Site: Spine Lumbar | Laterality: Bilateral

## 2020-02-15 MED ORDER — OXYCODONE HCL 5 MG PO TABS: 5.0000 mg | ORAL_TABLET | Freq: Once | ORAL | Status: DC | PRN

## 2020-02-15 MED ORDER — ACETAMINOPHEN 650 MG RE SUPP: 650.0000 mg | RECTAL | Status: DC | PRN

## 2020-02-15 MED ORDER — FENTANYL CITRATE (PF) 250 MCG/5ML IJ SOLN
INTRAMUSCULAR | Status: DC | PRN
Start: 2020-02-15 — End: 2020-02-15
  Administered 2020-02-15 (×5): 50 ug via INTRAVENOUS

## 2020-02-15 MED ORDER — ACETAMINOPHEN 160 MG/5ML PO SOLN: 325.0000 mg | ORAL | Status: DC | PRN

## 2020-02-15 MED ORDER — LACTATED RINGERS IV SOLN: INTRAVENOUS | Status: DC

## 2020-02-15 MED ORDER — MENTHOL 3 MG MT LOZG: 1.0000 | LOZENGE | OROMUCOSAL | Status: DC | PRN

## 2020-02-15 MED ORDER — FENTANYL CITRATE (PF) 250 MCG/5ML IJ SOLN
INTRAMUSCULAR | Status: AC
  Filled 2020-02-15: qty 5

## 2020-02-15 MED ORDER — THROMBIN 5000 UNITS EX SOLR
CUTANEOUS | Status: AC
  Filled 2020-02-15: qty 10000

## 2020-02-15 MED ORDER — AMISULPRIDE (ANTIEMETIC) 5 MG/2ML IV SOLN
INTRAVENOUS | Status: AC
  Filled 2020-02-15: qty 4

## 2020-02-15 MED ORDER — PHENYLEPHRINE 40 MCG/ML (10ML) SYRINGE FOR IV PUSH (FOR BLOOD PRESSURE SUPPORT)
PREFILLED_SYRINGE | INTRAVENOUS | Status: AC
  Filled 2020-02-15: qty 10

## 2020-02-15 MED ORDER — ONDANSETRON HCL 4 MG/2ML IJ SOLN
4.0000 mg | Freq: Once | INTRAMUSCULAR | Status: AC | PRN
  Administered 2020-02-15: 4 mg via INTRAVENOUS

## 2020-02-15 MED ORDER — SUCCINYLCHOLINE CHLORIDE 200 MG/10ML IV SOSY
PREFILLED_SYRINGE | INTRAVENOUS | Status: AC
  Filled 2020-02-15: qty 10

## 2020-02-15 MED ORDER — VANCOMYCIN HCL IN DEXTROSE 1-5 GM/200ML-% IV SOLN
1000.0000 mg | INTRAVENOUS | Status: AC
  Administered 2020-02-15: 1000 mg via INTRAVENOUS
  Filled 2020-02-15: qty 200

## 2020-02-15 MED ORDER — SODIUM CHLORIDE 0.9 % IV SOLN: 250.0000 mL | INTRAVENOUS | Status: DC

## 2020-02-15 MED ORDER — MEPERIDINE HCL 25 MG/ML IJ SOLN: 6.2500 mg | INTRAMUSCULAR | Status: DC | PRN

## 2020-02-15 MED ORDER — EPHEDRINE 5 MG/ML INJ
INTRAVENOUS | Status: AC
  Filled 2020-02-15: qty 10

## 2020-02-15 MED ORDER — HEMOSTATIC AGENTS (NO CHARGE) OPTIME
TOPICAL | Status: DC | PRN
  Administered 2020-02-15: 1 via TOPICAL

## 2020-02-15 MED ORDER — MORPHINE SULFATE (PF) 4 MG/ML IV SOLN: 4.0000 mg | INTRAVENOUS | Status: DC | PRN

## 2020-02-15 MED ORDER — PHENOL 1.4 % MT LIQD: 1.0000 | OROMUCOSAL | Status: DC | PRN

## 2020-02-15 MED ORDER — ROCURONIUM BROMIDE 10 MG/ML (PF) SYRINGE
PREFILLED_SYRINGE | INTRAVENOUS | Status: DC | PRN
  Administered 2020-02-15: 60 mg via INTRAVENOUS
  Administered 2020-02-15: 30 mg via INTRAVENOUS

## 2020-02-15 MED ORDER — PHENYLEPHRINE 40 MCG/ML (10ML) SYRINGE FOR IV PUSH (FOR BLOOD PRESSURE SUPPORT)
PREFILLED_SYRINGE | INTRAVENOUS | Status: DC | PRN
  Administered 2020-02-15: 120 ug via INTRAVENOUS
  Administered 2020-02-15: 80 ug via INTRAVENOUS

## 2020-02-15 MED ORDER — ACETAMINOPHEN 325 MG PO TABS: 325.0000 mg | ORAL_TABLET | ORAL | Status: DC | PRN

## 2020-02-15 MED ORDER — 0.9 % SODIUM CHLORIDE (POUR BTL) OPTIME
TOPICAL | Status: DC | PRN
  Administered 2020-02-15: 1000 mL

## 2020-02-15 MED ORDER — DEXAMETHASONE SODIUM PHOSPHATE 10 MG/ML IJ SOLN
INTRAMUSCULAR | Status: DC | PRN
  Administered 2020-02-15: 10 mg via INTRAVENOUS

## 2020-02-15 MED ORDER — ACETAMINOPHEN 10 MG/ML IV SOLN
INTRAVENOUS | Status: AC
  Filled 2020-02-15: qty 100

## 2020-02-15 MED ORDER — SODIUM CHLORIDE 0.9% FLUSH: 3.0000 mL | Freq: Two times a day (BID) | INTRAVENOUS | Status: DC

## 2020-02-15 MED ORDER — AMLODIPINE BESYLATE 5 MG PO TABS: 10.0000 mg | ORAL_TABLET | Freq: Every day | ORAL | Status: DC

## 2020-02-15 MED ORDER — HYDROCHLOROTHIAZIDE 25 MG PO TABS
25.0000 mg | ORAL_TABLET | Freq: Every day | ORAL | Status: DC
  Administered 2020-02-15: 25 mg via ORAL
  Filled 2020-02-15: qty 1

## 2020-02-15 MED ORDER — ROCURONIUM BROMIDE 10 MG/ML (PF) SYRINGE
PREFILLED_SYRINGE | INTRAVENOUS | Status: AC
  Filled 2020-02-15: qty 10

## 2020-02-15 MED ORDER — NEBIVOLOL HCL 5 MG PO TABS
5.0000 mg | ORAL_TABLET | Freq: Every morning | ORAL | Status: DC
  Filled 2020-02-15: qty 1

## 2020-02-15 MED ORDER — ACETAMINOPHEN 325 MG PO TABS: 650.0000 mg | ORAL_TABLET | ORAL | Status: DC | PRN

## 2020-02-15 MED ORDER — THROMBIN 5000 UNITS EX SOLR
CUTANEOUS | Status: AC
  Filled 2020-02-15: qty 5000

## 2020-02-15 MED ORDER — AMISULPRIDE (ANTIEMETIC) 5 MG/2ML IV SOLN
10.0000 mg | Freq: Once | INTRAVENOUS | Status: AC
  Administered 2020-02-15: 10 mg via INTRAVENOUS

## 2020-02-15 MED ORDER — ALBUMIN HUMAN 5 % IV SOLN: INTRAVENOUS | Status: DC | PRN

## 2020-02-15 MED ORDER — ONDANSETRON HCL 4 MG/2ML IJ SOLN
INTRAMUSCULAR | Status: AC
  Filled 2020-02-15: qty 2

## 2020-02-15 MED ORDER — SUGAMMADEX SODIUM 200 MG/2ML IV SOLN
INTRAVENOUS | Status: DC | PRN
  Administered 2020-02-15: 200 mg via INTRAVENOUS

## 2020-02-15 MED ORDER — CHLORHEXIDINE GLUCONATE CLOTH 2 % EX PADS: 6.0000 | MEDICATED_PAD | Freq: Once | CUTANEOUS | Status: DC

## 2020-02-15 MED ORDER — DOCUSATE SODIUM 100 MG PO CAPS
100.0000 mg | ORAL_CAPSULE | Freq: Two times a day (BID) | ORAL | Status: DC
  Administered 2020-02-15: 100 mg via ORAL
  Filled 2020-02-15: qty 1

## 2020-02-15 MED ORDER — ONDANSETRON HCL 4 MG PO TABS: 4.0000 mg | ORAL_TABLET | Freq: Four times a day (QID) | ORAL | Status: DC | PRN

## 2020-02-15 MED ORDER — OXYCODONE HCL 5 MG PO TABS: 10.0000 mg | ORAL_TABLET | ORAL | Status: DC | PRN

## 2020-02-15 MED ORDER — LIDOCAINE 2% (20 MG/ML) 5 ML SYRINGE
INTRAMUSCULAR | Status: AC
  Filled 2020-02-15: qty 5

## 2020-02-15 MED ORDER — DEXAMETHASONE SODIUM PHOSPHATE 10 MG/ML IJ SOLN
INTRAMUSCULAR | Status: AC
  Filled 2020-02-15: qty 1

## 2020-02-15 MED ORDER — PROPOFOL 10 MG/ML IV BOLUS
INTRAVENOUS | Status: AC
  Filled 2020-02-15: qty 20

## 2020-02-15 MED ORDER — ACETAMINOPHEN 10 MG/ML IV SOLN
INTRAVENOUS | Status: DC | PRN
  Administered 2020-02-15: 1000 mg via INTRAVENOUS

## 2020-02-15 MED ORDER — ONDANSETRON HCL 4 MG/2ML IJ SOLN: 4.0000 mg | Freq: Four times a day (QID) | INTRAMUSCULAR | Status: DC | PRN

## 2020-02-15 MED ORDER — ONDANSETRON HCL 4 MG/2ML IJ SOLN
INTRAMUSCULAR | Status: DC | PRN
  Administered 2020-02-15: 4 mg via INTRAVENOUS

## 2020-02-15 MED ORDER — BUPIVACAINE HCL (PF) 0.5 % IJ SOLN
INTRAMUSCULAR | Status: AC
  Filled 2020-02-15: qty 30

## 2020-02-15 MED ORDER — SIMVASTATIN 20 MG PO TABS
20.0000 mg | ORAL_TABLET | Freq: Every evening | ORAL | Status: DC
  Administered 2020-02-15: 20 mg via ORAL
  Filled 2020-02-15: qty 1

## 2020-02-15 MED ORDER — BACITRACIN ZINC 500 UNIT/GM EX OINT
TOPICAL_OINTMENT | CUTANEOUS | Status: DC | PRN
  Administered 2020-02-15: 1 via TOPICAL

## 2020-02-15 MED ORDER — BACITRACIN ZINC 500 UNIT/GM EX OINT
TOPICAL_OINTMENT | CUTANEOUS | Status: AC
  Filled 2020-02-15: qty 28.35

## 2020-02-15 MED ORDER — LIDOCAINE 2% (20 MG/ML) 5 ML SYRINGE
INTRAMUSCULAR | Status: DC | PRN
  Administered 2020-02-15: 40 mg via INTRAVENOUS

## 2020-02-15 MED ORDER — VANCOMYCIN HCL IN DEXTROSE 1-5 GM/200ML-% IV SOLN
1000.0000 mg | Freq: Once | INTRAVENOUS | Status: AC
  Administered 2020-02-16: 1000 mg via INTRAVENOUS
  Filled 2020-02-15: qty 200

## 2020-02-15 MED ORDER — CHLORHEXIDINE GLUCONATE 0.12 % MT SOLN
15.0000 mL | Freq: Once | OROMUCOSAL | Status: AC
  Administered 2020-02-15: 15 mL via OROMUCOSAL
  Filled 2020-02-15: qty 15

## 2020-02-15 MED ORDER — GLYCOPYRROLATE PF 0.2 MG/ML IJ SOSY
PREFILLED_SYRINGE | INTRAMUSCULAR | Status: AC
  Filled 2020-02-15: qty 1

## 2020-02-15 MED ORDER — FENTANYL CITRATE (PF) 100 MCG/2ML IJ SOLN
INTRAMUSCULAR | Status: AC
  Filled 2020-02-15: qty 2

## 2020-02-15 MED ORDER — THROMBIN 5000 UNITS EX SOLR
CUTANEOUS | Status: DC | PRN
  Administered 2020-02-15 (×2): 5000 [IU] via TOPICAL

## 2020-02-15 MED ORDER — EPHEDRINE SULFATE-NACL 50-0.9 MG/10ML-% IV SOSY
PREFILLED_SYRINGE | INTRAVENOUS | Status: DC | PRN
  Administered 2020-02-15 (×3): 10 mg via INTRAVENOUS

## 2020-02-15 MED ORDER — PANTOPRAZOLE SODIUM 40 MG PO TBEC
40.0000 mg | DELAYED_RELEASE_TABLET | Freq: Every day | ORAL | Status: DC
  Administered 2020-02-16: 40 mg via ORAL
  Filled 2020-02-15: qty 1

## 2020-02-15 MED ORDER — THROMBIN 5000 UNITS EX SOLR: OROMUCOSAL | Status: DC | PRN

## 2020-02-15 MED ORDER — PROPOFOL 10 MG/ML IV BOLUS
INTRAVENOUS | Status: DC | PRN
  Administered 2020-02-15: 130 mg via INTRAVENOUS

## 2020-02-15 MED ORDER — OXYCODONE HCL 5 MG PO TABS
5.0000 mg | ORAL_TABLET | ORAL | Status: DC | PRN
  Administered 2020-02-16 (×2): 5 mg via ORAL
  Filled 2020-02-15 (×2): qty 1

## 2020-02-15 MED ORDER — SODIUM CHLORIDE 0.9% FLUSH: 3.0000 mL | INTRAVENOUS | Status: DC | PRN

## 2020-02-15 MED ORDER — BISACODYL 10 MG RE SUPP: 10.0000 mg | Freq: Every day | RECTAL | Status: DC | PRN

## 2020-02-15 MED ORDER — BUPIVACAINE-EPINEPHRINE (PF) 0.5% -1:200000 IJ SOLN
INTRAMUSCULAR | Status: DC | PRN
  Administered 2020-02-15: 10 mL

## 2020-02-15 MED ORDER — ACETAMINOPHEN 500 MG PO TABS
1000.0000 mg | ORAL_TABLET | Freq: Four times a day (QID) | ORAL | Status: DC
  Administered 2020-02-15 – 2020-02-16 (×3): 1000 mg via ORAL
  Filled 2020-02-15 (×3): qty 2

## 2020-02-15 MED ORDER — OXYCODONE HCL 5 MG/5ML PO SOLN: 5.0000 mg | Freq: Once | ORAL | Status: DC | PRN

## 2020-02-15 MED ORDER — OLMESARTAN-AMLODIPINE-HCTZ 40-10-25 MG PO TABS: 1.0000 | ORAL_TABLET | Freq: Every morning | ORAL | Status: DC

## 2020-02-15 MED ORDER — ORAL CARE MOUTH RINSE: 15.0000 mL | Freq: Once | OROMUCOSAL | Status: AC

## 2020-02-15 MED ORDER — MIDAZOLAM HCL 2 MG/2ML IJ SOLN
INTRAMUSCULAR | Status: AC
  Filled 2020-02-15: qty 2

## 2020-02-15 MED ORDER — IRBESARTAN 150 MG PO TABS: 300.0000 mg | ORAL_TABLET | Freq: Every day | ORAL | Status: DC

## 2020-02-15 MED ORDER — CYCLOBENZAPRINE HCL 10 MG PO TABS
10.0000 mg | ORAL_TABLET | Freq: Three times a day (TID) | ORAL | Status: DC | PRN
  Administered 2020-02-15: 10 mg via ORAL
  Filled 2020-02-15: qty 1

## 2020-02-15 MED ORDER — FENTANYL CITRATE (PF) 100 MCG/2ML IJ SOLN
25.0000 ug | INTRAMUSCULAR | Status: DC | PRN
  Administered 2020-02-15: 50 ug via INTRAVENOUS

## 2020-02-15 SURGICAL SUPPLY — 45 items
BAG DECANTER FOR FLEXI CONT (MISCELLANEOUS) ×2 IMPLANT
BAND RUBBER #18 3X1/16 STRL (MISCELLANEOUS) ×4 IMPLANT
BENZOIN TINCTURE PRP APPL 2/3 (GAUZE/BANDAGES/DRESSINGS) ×2 IMPLANT
BLADE CLIPPER SURG (BLADE) IMPLANT
BUR MATCHSTICK NEURO 3.0 LAGG (BURR) ×2 IMPLANT
BUR PRECISION FLUTE 6.0 (BURR) ×2 IMPLANT
CANISTER SUCT 3000ML PPV (MISCELLANEOUS) ×2 IMPLANT
CARTRIDGE OIL MAESTRO DRILL (MISCELLANEOUS) ×1 IMPLANT
COVER WAND RF STERILE (DRAPES) ×2 IMPLANT
DIFFUSER DRILL AIR PNEUMATIC (MISCELLANEOUS) ×2 IMPLANT
DRAPE LAPAROTOMY 100X72X124 (DRAPES) ×2 IMPLANT
DRAPE MICROSCOPE LEICA (MISCELLANEOUS) ×2 IMPLANT
DRAPE SURG 17X23 STRL (DRAPES) ×8 IMPLANT
DRSG OPSITE POSTOP 4X6 (GAUZE/BANDAGES/DRESSINGS) ×2 IMPLANT
ELECT BLADE 4.0 EZ CLEAN MEGAD (MISCELLANEOUS) ×2
ELECT REM PT RETURN 9FT ADLT (ELECTROSURGICAL) ×2
ELECTRODE BLDE 4.0 EZ CLN MEGD (MISCELLANEOUS) ×1 IMPLANT
ELECTRODE REM PT RTRN 9FT ADLT (ELECTROSURGICAL) ×1 IMPLANT
GAUZE 4X4 16PLY RFD (DISPOSABLE) IMPLANT
GAUZE SPONGE 4X4 12PLY STRL (GAUZE/BANDAGES/DRESSINGS) ×2 IMPLANT
GLOVE BIO SURGEON STRL SZ8 (GLOVE) ×2 IMPLANT
GLOVE BIO SURGEON STRL SZ8.5 (GLOVE) ×2 IMPLANT
GLOVE EXAM NITRILE XL STR (GLOVE) IMPLANT
GOWN STRL REUS W/ TWL LRG LVL3 (GOWN DISPOSABLE) IMPLANT
GOWN STRL REUS W/ TWL XL LVL3 (GOWN DISPOSABLE) ×1 IMPLANT
GOWN STRL REUS W/TWL 2XL LVL3 (GOWN DISPOSABLE) IMPLANT
GOWN STRL REUS W/TWL LRG LVL3 (GOWN DISPOSABLE)
GOWN STRL REUS W/TWL XL LVL3 (GOWN DISPOSABLE) ×2
KIT BASIN OR (CUSTOM PROCEDURE TRAY) ×2 IMPLANT
KIT TURNOVER KIT B (KITS) ×2 IMPLANT
NEEDLE HYPO 21X1.5 SAFETY (NEEDLE) IMPLANT
NEEDLE HYPO 22GX1.5 SAFETY (NEEDLE) ×2 IMPLANT
NS IRRIG 1000ML POUR BTL (IV SOLUTION) ×2 IMPLANT
OIL CARTRIDGE MAESTRO DRILL (MISCELLANEOUS) ×2
PACK LAMINECTOMY NEURO (CUSTOM PROCEDURE TRAY) ×2 IMPLANT
PAD ARMBOARD 7.5X6 YLW CONV (MISCELLANEOUS) ×6 IMPLANT
PATTIES SURGICAL .5 X1 (DISPOSABLE) IMPLANT
SPONGE SURGIFOAM ABS GEL SZ50 (HEMOSTASIS) ×2 IMPLANT
STRIP CLOSURE SKIN 1/2X4 (GAUZE/BANDAGES/DRESSINGS) ×2 IMPLANT
SUT VIC AB 1 CT1 18XBRD ANBCTR (SUTURE) ×1 IMPLANT
SUT VIC AB 1 CT1 8-18 (SUTURE) ×2
SUT VIC AB 2-0 CP2 18 (SUTURE) ×2 IMPLANT
TOWEL GREEN STERILE (TOWEL DISPOSABLE) ×2 IMPLANT
TOWEL GREEN STERILE FF (TOWEL DISPOSABLE) ×2 IMPLANT
WATER STERILE IRR 1000ML POUR (IV SOLUTION) ×2 IMPLANT

## 2020-02-15 NOTE — H&P (Signed)
Subjective: The patient is a 74 year old white male who has complained of back and bilateral leg pain consistent with neurogenic claudication.  He has failed medical management and was worked up with a lumbar MRI which demonstrated spinal stenosis and herniated disc at L3-4.  I discussed the various treatment options with him.  He has decided proceed with surgery after weighing the risk, benefits and alternatives.  Past Medical History:  Diagnosis Date  . Arthritis   . Atrial fibrillation ([place]) 03/2015  . Carotid artery occlusion   . CKD (chronic kidney disease)   . Coronary artery disease   . GERD (gastroesophageal reflux disease)   . Headache   . History of bleeding ulcers   . History of kidney stones   . Hyperlipidemia   . Hypertension   . Pneumonia   . Renal artery stenosis (HCC)    Multiple interventions  . Seasonal allergies   . Shortness of breath    "at any time" (12/13/2012)  . Status post percutaneous angioplasty of renal artery 10/18 2012   PV angiogram preformed revealing 90% right and 70% left in stent restenosis with 24mm and 36mm gradients respectively.    Past Surgical History:  Procedure Laterality Date  . CARDIAC CATHETERIZATION  03/02/2009   has moderate circumflex and diagonal branch disease with high-grade distal right disease and fail attempt at PCI  . COLONOSCOPY  12/18/2010   Procedure: COLONOSCOPY;  Surgeon: Rogene Houston, MD;  [facility];  Service: Endoscopy;  Laterality: N/A;  . COLONOSCOPY N/A 09/21/2019   Procedure: COLONOSCOPY;  Surgeon: Rogene Houston, MD;  [facility];  Service: Endoscopy;  Laterality: N/A;  1210  . CORONARY ANGIOPLASTY WITH STENT PLACEMENT  03/28/2009   Successful PCI od high-grade 95% eccentric tandem plague stenosis in the distal right coronary artery with ultimate insertion of a 3.0 x 18 mm Xience V DES stent postdilated at 3.46 mm with the 99% stenosis being reduced to 0%  . ENDARTERECTOMY Right 09/26/2015    Procedure: RIGHT CAROTID ARTERY ENDARTERECTOMY ;  Surgeon: Elam Dutch, MD;  Location: [facility] OR;  Service: Vascular;  Laterality: Right;  . ESOPHAGOGASTRODUODENOSCOPY N/A 11/08/2014   Procedure: ESOPHAGOGASTRODUODENOSCOPY (EGD);  Surgeon: Rogene Houston, MD;  [facility];  Service: Endoscopy;  Laterality: N/A;  . ESOPHAGOGASTRODUODENOSCOPY N/A 03/15/2015   Procedure: ESOPHAGOGASTRODUODENOSCOPY (EGD);  Surgeon: Rogene Houston, MD;  [facility];  Service: Endoscopy;  Laterality: N/A;  1255  . LITHOTRIPSY     "laser" (12/13/2012)  . PATCH ANGIOPLASTY Right 09/26/2015   Procedure: WITH HEMASHIELD PATCH ANGIOPLASTY;  Surgeon: Elam Dutch, MD;  Location: [place];  Service: Vascular;  Laterality: Right;  . PERCUTANEOUS STENT INTERVENTION Left 12/14/2012   Procedure: PERCUTANEOUS STENT INTERVENTION;  Surgeon: [name], MD;  Location: [facility] CATH LAB;  Service: Cardiovascular;  Laterality: Left;  left renal artery  . PERCUTANEOUS STENT INTERVENTION  02/16/2014   Procedure: PERCUTANEOUS STENT INTERVENTION;  Surgeon: [name], MD;  Location: [facility] CATH LAB;  Service: Cardiovascular;;  left renal  . POLYPECTOMY  09/21/2019   Procedure: POLYPECTOMY;  Surgeon: Rogene Houston, MD;  [facility];  Service: Endoscopy;;  . RENAL ANGIOGRAM N/A 12/14/2012   Procedure: RENAL ANGIOGRAM;  Surgeon: [name], MD;  Location: [facility] CATH LAB;  Service: Cardiovascular;  Laterality: N/A;  . RENAL ANGIOGRAM N/A 02/16/2014   Procedure: RENAL ANGIOGRAM;  Surgeon: [name], MD;  Location: [facility] CATH LAB;  Service: Cardiovascular;  Laterality: N/A;  . RENAL ARTERY ANGIOPLASTY Bilateral 02/2014   PV restenting L RA with an ICast covered stent and cutting balloon atherectomy R RA  . RENAL ARTERY STENT Bilateral 2010    Allergies  Allergen Reactions  . Penicillins Rash    Social History   Tobacco Use  . Smoking status: Former Smoker    Packs/day: 2.00    Years: 50.00     Pack years: 100.00    Types: Cigarettes    Quit date: 02/24/2011    Years since quitting: 8.9  . Smokeless tobacco: Never Used  Substance Use Topics  . Alcohol use: No    Alcohol/week: 0.0 standard drinks    Comment: 12/13/2012 "last alcohol was ~ 4 yr ago"    Family History  Problem Relation Age of Onset  . COPD Father 73       deceased   Prior to Admission medications   Medication Sig Start Date End Date Taking? Authorizing Provider  acetaminophen (TYLENOL 8 HOUR ARTHRITIS PAIN) 650 MG CR tablet Take 650 mg by mouth every 8 (eight) hours as needed for pain.    Yes [provider]  acetaminophen (TYLENOL) 500 MG tablet Take 500 mg by mouth in the morning and at bedtime.    Yes [provider]  aspirin EC 81 MG tablet Take 1 tablet (81 mg total) by mouth daily. Start on 11/15/14 Patient taking differently: Take 81 mg by mouth every morning. Start on 11/15/14 09/23/19  Yes Rehman, Mechele Dawley, MD  BYSTOLIC 5 MG tablet TAKE 1 TABLET BY MOUTH DAILY WITH BREAKFAST. Patient taking differently: Take 5 mg by mouth every morning.  10/27/19  Yes [name], MD  clobetasol cream (TEMOVATE) 2.77 % Apply 1 application topically daily as needed (Psoriasis).   Yes [provider]  diphenhydrAMINE (BENADRYL) 25 MG tablet Take 25 mg by mouth at bedtime.    Yes [provider]  ELIQUIS 5 MG TABS tablet TAKE (1) TABLET BY MOUTH TWICE DAILY. Patient taking differently: Take 5 mg by mouth 2 (two) times daily.  11/28/19  Yes [name], MD  lidocaine (LIDODERM) 5 % Remove & Discard patch within 12 hours or as directed by MD Apply to your mid lower back Patient taking differently: Place 1 patch onto the skin daily as needed (back pain).  10/31/19  Yes Idol, Almyra Free, PA-C  Olmesartan-amLODIPine-HCTZ 40-10-25 MG TABS TAKE 1 TABLET BY MOUTH DAILY WITH BREAKFAST. Patient taking differently: Take 1 tablet by mouth every morning.  06/10/19  Yes [name], MD   pantoprazole (PROTONIX) 40 MG tablet Take 1 tablet (40 mg total) by mouth daily before breakfast. 09/21/19  Yes Rehman, Mechele Dawley, MD  simvastatin (ZOCOR) 20 MG tablet TAKE (1) TABLET BY MOUTH AT BEDTIME. Patient taking differently: Take 20 mg by mouth every evening.  06/10/19  Yes [name], MD  predniSONE (DELTASONE) 50 MG tablet Take 1 tablet daily for 4 days Patient not taking: Reported on 02/02/2020 10/31/19   Evalee Jefferson, PA-C     Review of Systems  Positive ROS: As above  All other systems have been reviewed and were otherwise negative with the exception of those mentioned in the HPI and as above.  Objective: Vital signs in last 24 hours: Temp:  [98 F (36.7 C)] 98 F (36.7 C) (10/06 1222) Pulse Rate:  [95] 95 (10/06 1222) Resp:  [18] 18 (10/06 1222) BP: (180)/(74) 180/74 (10/06 1222) SpO2:  [99 %]  99 % (10/06 1222) Estimated body mass index is 35.68 kg/m as calculated from the following:   Height as of 02/13/20: 5\' 11"  (1.803 m).   Weight as of 02/13/20: 116 kg.   General Appearance: Alert Head: Normocephalic, without obvious abnormality, atraumatic Eyes: PERRL, conjunctiva/corneas clear, EOM's intact,    Ears: Normal  Throat: Normal  Neck: Supple, Back: unremarkable Lungs: Clear to auscultation bilaterally, respirations unlabored Heart: Regular rate and rhythm, no murmur, rub or gallop Abdomen: Soft, non-tender Extremities: Extremities normal, atraumatic, no cyanosis or edema Skin: unremarkable  NEUROLOGIC:   Mental status: alert and oriented,Motor Exam - grossly normal Sensory Exam - grossly normal Reflexes:  Coordination - grossly normal Gait - grossly normal Balance - grossly normal Cranial Nerves: I: smell Not tested  II: visual acuity  OS: Normal  OD: Normal   II: visual fields Full to confrontation  II: pupils Equal, round, reactive to light  III,VII: ptosis None  III,IV,VI: extraocular muscles  Full ROM  V: mastication Normal  V: facial  light touch sensation  Normal  V,VII: corneal reflex  Present  VII: facial muscle function - upper  Normal  VII: facial muscle function - lower Normal  VIII: hearing Not tested  IX: soft palate elevation  Normal  IX,X: gag reflex Present  XI: trapezius strength  5/5  XI: sternocleidomastoid strength 5/5  XI: neck flexion strength  5/5  XII: tongue strength  Normal    Data Review Lab Results  Component Value Date   WBC 9.3 02/13/2020   HGB 15.9 02/13/2020   HCT 50.3 02/13/2020   MCV 93.8 02/13/2020   PLT 206 02/13/2020   Lab Results  Component Value Date   NA 140 02/13/2020   K 4.2 02/13/2020   CL 104 02/13/2020   CO2 25 02/13/2020   BUN 26 (H) 02/13/2020   CREATININE 1.88 (H) 02/13/2020   GLUCOSE 105 (H) 02/13/2020   Lab Results  Component Value Date   INR 1.24 09/19/2015    Assessment/Plan: L3-4 spinal stenosis, herniated disc, lumbago, lumbar radiculopathy, neurogenic claudication: I have discussed the situation with the patient.  I reviewed his imaging studies with him and pointed out the abnormalities.  We have discussed the various treatment options including surgery.  I have described the surgical treatment option of bilateral L3-4 laminotomies with a left L3-4 discectomy.  I have shown him surgical models.  I have given him a surgical pamphlet.  We have discussed the risk, benefits, alternatives, expected postop course, and likelihood of achieving our goals with surgery.  I have answered all his questions.  He has decided to proceed with surgery.   Ophelia Charter 02/15/2020 3:45 PM

## 2020-02-15 NOTE — Progress Notes (Signed)
Pharmacy Antibiotic Note  Steven Scott is a 74 y.o. male admitted on 02/15/2020 with L3-4 spinal stenosis, neurogenic claudication, herniated disc, lumbar radiculopathy, lumbago; pt is S/P laminotomy/discectomy this afternoon. Pharmacy has been consulted for vancomycin dosing for post-op surgical prophylaxis.  Pt rec'd vancomycin 1 gm IV X 1 pre op at 1330 PM today. Per surgeon note, pt has no drains in place post op.  WBC 9.3, afebrile, Scr 1.88, CrCl 44 ml/min  Plan: Vancomycin 1 gm IV X 1 ~12 hrs after pre-op dose (scheduled for 0130 AM on 10/7)  Temp (24hrs), Avg:97.7 F (36.5 C), Min:97.2 F (36.2 C), Max:98 F (36.7 C)  Recent Labs  Lab 02/13/20 1041  WBC 9.3  CREATININE 1.88*    Estimated Creatinine Clearance: 44 mL/min (A) (by C-G formula based on SCr of 1.88 mg/dL (H)).    Allergies  Allergen Reactions  . Penicillins Rash    Microbiology results: 10/4 MRSA PCR: negative 10/4 COVID: negative  Thank you for allowing pharmacy to be a part of this patient's care.  Gillermina Hu, PharmD, BCPS, [facility] Clinical Pharmacist 02/15/2020 7:58 PM

## 2020-02-15 NOTE — Anesthesia Postprocedure Evaluation (Signed)
Anesthesia Post Note  Patient: Steven Scott  Procedure(s) Performed: BILATERAL LUMBAR THREE- LUMBAR FOUR LAMINOTOMY, FORAMINOTOMY, LEFT LUMBAR THREE- LUMBAR FOUR DISKECTOMY (Bilateral Spine Lumbar)     Patient location during evaluation: PACU Anesthesia Type: General Level of consciousness: awake and alert Pain management: pain level controlled Vital Signs Assessment: post-procedure vital signs reviewed and stable Respiratory status: spontaneous breathing, nonlabored ventilation, respiratory function stable and patient connected to nasal cannula oxygen Cardiovascular status: blood pressure returned to baseline and stable Postop Assessment: no apparent nausea or vomiting Anesthetic complications: no   No complications documented.  Last Vitals:  Vitals:   02/15/20 1845 02/15/20 1900  BP: (!) 146/128 (!) 150/97  Pulse: 74 65  Resp: 18 18  Temp:    SpO2: 100% 93%    Last Pain:  Vitals:   02/15/20 1900  TempSrc:   PainSc: 0-No pain                 Nate Perri DANIEL

## 2020-02-15 NOTE — Anesthesia Procedure Notes (Signed)
Procedure Name: Intubation Date/Time: 02/15/2020 4:11 PM Performed by: Kathryne Hitch, CRNA Pre-anesthesia Checklist: Patient identified, Emergency Drugs available, Suction available and Patient being monitored Patient Re-evaluated:Patient Re-evaluated prior to induction Oxygen Delivery Method: Circle system utilized Preoxygenation: Pre-oxygenation with 100% oxygen Induction Type: IV induction Ventilation: Mask ventilation without difficulty Laryngoscope Size: Miller and 2 Grade View: Grade I Tube type: Oral Tube size: 7.5 mm Number of attempts: 1 Airway Equipment and Method: Stylet and Oral airway Placement Confirmation: ETT inserted through vocal cords under direct vision,  positive ETCO2 and breath sounds checked- equal and bilateral Secured at: 23 cm Tube secured with: Tape Dental Injury: Teeth and Oropharynx as per pre-operative assessment

## 2020-02-15 NOTE — Progress Notes (Signed)
Patient's daughter Lattie Haw updated via phone on patient's condition and room assignment.

## 2020-02-15 NOTE — Transfer of Care (Signed)
Immediate Anesthesia Transfer of Care Note  Patient: Steven Scott  Procedure(s) Performed: BILATERAL LUMBAR THREE- LUMBAR FOUR LAMINOTOMY, FORAMINOTOMY, LEFT LUMBAR THREE- LUMBAR FOUR DISKECTOMY (Bilateral Spine Lumbar)  Patient Location: PACU  Anesthesia Type:General  Level of Consciousness: drowsy and patient cooperative  Airway & Oxygen Therapy: Patient Spontanous Breathing and Patient connected to face mask oxygen  Post-op Assessment: Report given to RN and Post -op Vital signs reviewed and stable  Post vital signs: Reviewed and stable  Last Vitals:  Vitals Value Taken Time  BP 161/89 02/15/20 1814  Temp    Pulse 112 02/15/20 1818  Resp 17 02/15/20 1818  SpO2 100 % 02/15/20 1818  Vitals shown include unvalidated device data.  Last Pain:  Vitals:   02/15/20 1222  TempSrc: Oral         Complications: No complications documented.

## 2020-02-15 NOTE — Op Note (Signed)
Brief history: The patient is a 74 year old white male who has complained of back and bilateral leg pain consistent with neurogenic claudication/lumbar radiculopathy.  He has failed medical management and was worked up with a lumbar MRI which demonstrated L3-4 spinal stenosis and herniated disc.  I discussed the various treatment options with him.  He has decided proceed with surgery after weighing the risk, benefits and alternatives.  Preoperative diagnosis: L3-4 spinal stenosis, neurogenic claudication, herniated disc, lumbar radiculopathy, lumbago  Postoperative diagnosis: The same  Procedure: Bilateral L3-4 laminotomy foraminotomies using microdissection; left L3-4 intervertebral discectomy using micro-dissection  Surgeon: Dr. Earle Gell  Asst.: Arnetha Massy, NP  Anesthesia: Gen. endotracheal  Estimated blood loss: 75 cc  Drains: None  Complications: None  Description of procedure: The patient was brought to the operating room by the anesthesia team. General endotracheal anesthesia was induced. The patient was turned to the prone position on the Wilson frame. The patient's lumbosacral region was then prepared with Betadine scrub and Betadine solution. Sterile drapes were applied.  I then injected the area to be incised with Marcaine with epinephrine solution. I then used a scalpel to make a linear midline incision over the L3-4 intervertebral disc space. I then used electrocautery to perform a bilateral subperiosteal dissection exposing the spinous process and lamina of L3-4 bilaterally. We obtained intraoperative radiograph to confirm our location. I then inserted the [facility] retractor for exposure.  We then brought the operative microscope into the field. Under its magnification and illumination we completed the microdissection. I used a high-speed drill to perform a laminotomy at bilaterally at L3-4. I then used a Kerrison punches to widen the laminotomy and removed the  ligamentum flavum at bilateral L3-4. We then used microdissection to free up the thecal sac and the bilateral L4 nerve root from the epidural tissue. I then used a Kerrison punch to perform a foraminotomy at about the bilateral L4 nerve root. We then using the nerve root retractor to gently retract the left thecal sac and the left L4 nerve root medially. This exposed the intervertebral disc. We identified the ruptured disc and remove it with the pituitary forceps.  Performed a partial intervertebral discectomy.  I then palpated along the ventral surface of the thecal sac and along exit route of the bilateral L4 nerve root and noted that the neural structures were well decompressed. This completed the decompression.  We then obtained hemostasis using bipolar electrocautery. We irrigated the wound out with bacitracin solution. We then removed the retractor. We then reapproximated the patient's thoracolumbar fascia with interrupted #1 Vicryl suture. We then reapproximated the patient's subcutaneous tissue with interrupted 2-0 Vicryl suture. We then reapproximated patient's skin with Steri-Strips and benzoin. The was then coated with bacitracin ointment. The drapes were removed. The patient was subsequently returned to the supine position where they were extubated by the anesthesia team. The patient was then transported to the postanesthesia care unit in stable condition. All sponge instrument and needle counts were reportedly correct at the end of this case.

## 2020-02-16 ENCOUNTER — Encounter (HOSPITAL_COMMUNITY): Payer: Self-pay | Admitting: Neurosurgery

## 2020-02-16 DIAGNOSIS — Z7982 Long term (current) use of aspirin: Secondary | ICD-10-CM | POA: Diagnosis not present

## 2020-02-16 DIAGNOSIS — Z7901 Long term (current) use of anticoagulants: Secondary | ICD-10-CM | POA: Diagnosis not present

## 2020-02-16 DIAGNOSIS — Z87891 Personal history of nicotine dependence: Secondary | ICD-10-CM | POA: Diagnosis not present

## 2020-02-16 DIAGNOSIS — I739 Peripheral vascular disease, unspecified: Secondary | ICD-10-CM | POA: Diagnosis not present

## 2020-02-16 DIAGNOSIS — M5116 Intervertebral disc disorders with radiculopathy, lumbar region: Secondary | ICD-10-CM | POA: Diagnosis not present

## 2020-02-16 DIAGNOSIS — Z880 Allergy status to penicillin: Secondary | ICD-10-CM | POA: Diagnosis not present

## 2020-02-16 DIAGNOSIS — N189 Chronic kidney disease, unspecified: Secondary | ICD-10-CM | POA: Diagnosis not present

## 2020-02-16 DIAGNOSIS — E785 Hyperlipidemia, unspecified: Secondary | ICD-10-CM | POA: Diagnosis not present

## 2020-02-16 DIAGNOSIS — I129 Hypertensive chronic kidney disease with stage 1 through stage 4 chronic kidney disease, or unspecified chronic kidney disease: Secondary | ICD-10-CM | POA: Diagnosis not present

## 2020-02-16 DIAGNOSIS — I2510 Atherosclerotic heart disease of native coronary artery without angina pectoris: Secondary | ICD-10-CM | POA: Diagnosis not present

## 2020-02-16 DIAGNOSIS — I4891 Unspecified atrial fibrillation: Secondary | ICD-10-CM | POA: Diagnosis not present

## 2020-02-16 DIAGNOSIS — Z79899 Other long term (current) drug therapy: Secondary | ICD-10-CM | POA: Diagnosis not present

## 2020-02-16 DIAGNOSIS — M48062 Spinal stenosis, lumbar region with neurogenic claudication: Secondary | ICD-10-CM | POA: Diagnosis not present

## 2020-02-16 DIAGNOSIS — Z955 Presence of coronary angioplasty implant and graft: Secondary | ICD-10-CM | POA: Diagnosis not present

## 2020-02-16 DIAGNOSIS — K219 Gastro-esophageal reflux disease without esophagitis: Secondary | ICD-10-CM | POA: Diagnosis not present

## 2020-02-16 MED ORDER — OXYCODONE-ACETAMINOPHEN 5-325 MG PO TABS: 1.0000 | ORAL_TABLET | ORAL | Status: DC | PRN

## 2020-02-16 MED ORDER — DOCUSATE SODIUM 100 MG PO CAPS
100.0000 mg | ORAL_CAPSULE | Freq: Two times a day (BID) | ORAL | 0 refills | Status: DC
Start: 2020-02-16 — End: 2023-10-19

## 2020-02-16 MED ORDER — OXYCODONE-ACETAMINOPHEN 5-325 MG PO TABS
1.0000 | ORAL_TABLET | ORAL | 0 refills | Status: DC | PRN
Start: 2020-02-16 — End: 2020-07-18

## 2020-02-16 NOTE — Discharge Summary (Signed)
Physician Discharge Summary  Patient ID: Steven Scott MRN: [id number] DOB/AGE: March 31, 1946 74 y.o.  Admit date: 02/15/2020 Discharge date: 02/16/2020  Admission Diagnoses: Lumbar spinal stenosis with neurogenic claudication, lumbago, lumbar radiculopathy, lumbar herniated disc  Discharge Diagnoses: The same Active Problems:   Spinal stenosis of lumbar region with neurogenic claudication   Discharged Condition: good  Hospital Course: I performed bilateral L3/4 laminotomy/foraminotomies and a left L3/4 discectomy on the patient on 2020-02-15.  The surgery went well.  The patient's postoperative course was unremarkable.  On postoperative day #1 he felt better and requested discharge home.  He was given oral and written discharge instructions.  He was instructed to resume his Eliquis on 2020-02-20.  All his questions were answered.  Consults: PT, OT, care management Significant Diagnostic Studies: None Treatments: Bilateral L3/4 laminotomy/foraminotomies, left L3/4 discectomy using microdissection Discharge Exam: Blood pressure (!) 179/73, pulse 95, temperature 97.7 F (36.5 C), temperature source Oral, resp. rate 17, SpO2 99 %. The patient is alert and pleasant.  His lower extremity strength is grossly normal.  His dressing is clean and dry.  He looks well.  Disposition: Home  Discharge Instructions    Call MD for:  difficulty breathing, headache or visual disturbances   Complete by: As directed    Call MD for:  extreme fatigue   Complete by: As directed    Call MD for:  hives   Complete by: As directed    Call MD for:  persistant dizziness or light-headedness   Complete by: As directed    Call MD for:  persistant nausea and vomiting   Complete by: As directed    Call MD for:  redness, tenderness, or signs of infection (pain, swelling, redness, odor or green/yellow discharge around incision site)   Complete by: As directed    Call MD for:  severe uncontrolled pain   Complete  by: As directed    Call MD for:  temperature >100.4   Complete by: As directed    Diet - low sodium heart healthy   Complete by: As directed    Discharge instructions   Complete by: As directed    Call [phone] for a followup appointment. Take a stool softener while you are using pain medications.   Driving Restrictions   Complete by: As directed    Do not drive for 2 weeks.   Increase activity slowly   Complete by: As directed    Lifting restrictions   Complete by: As directed    Do not lift more than 5 pounds. No excessive bending or twisting.   May shower / Bathe   Complete by: As directed    Remove the dressing for 3 days after surgery.  You may shower, but leave the incision alone.   Remove dressing in 48 hours   Complete by: As directed      Allergies as of 02/16/2020      Reactions   Penicillins Rash      Medication List    STOP taking these medications   acetaminophen 500 MG tablet Commonly known as: TYLENOL   lidocaine 5 % Commonly known as: Lidoderm   predniSONE 50 MG tablet Commonly known as: DELTASONE   Tylenol 8 Hour Arthritis Pain 650 MG CR tablet Generic drug: acetaminophen     TAKE these medications   aspirin EC 81 MG tablet Take 1 tablet (81 mg total) by mouth daily. Start on 11/15/14 What changed: when to take this   Bystolic 5 MG  tablet Generic drug: nebivolol TAKE 1 TABLET BY MOUTH DAILY WITH BREAKFAST. What changed:   how much to take  when to take this   clobetasol cream 0.05 % Commonly known as: TEMOVATE Apply 1 application topically daily as needed (Psoriasis).   diphenhydrAMINE 25 MG tablet Commonly known as: BENADRYL Take 25 mg by mouth at bedtime.   docusate sodium 100 MG capsule Commonly known as: COLACE Take 1 capsule (100 mg total) by mouth 2 (two) times daily.   Eliquis 5 MG Tabs tablet Generic drug: apixaban TAKE (1) TABLET BY MOUTH TWICE DAILY. What changed: See the new instructions.    Olmesartan-amLODIPine-HCTZ 40-10-25 MG Tabs TAKE 1 TABLET BY MOUTH DAILY WITH BREAKFAST. What changed: when to take this   oxyCODONE-acetaminophen 5-325 MG tablet Commonly known as: PERCOCET/ROXICET Take 1 tablet by mouth every 4 (four) hours as needed for moderate pain.   pantoprazole 40 MG tablet Commonly known as: PROTONIX Take 1 tablet (40 mg total) by mouth daily before breakfast.   simvastatin 20 MG tablet Commonly known as: ZOCOR TAKE (1) TABLET BY MOUTH AT BEDTIME. What changed: See the new instructions.        Signed: Ophelia Charter 02/16/2020, 8:04 AM

## 2020-02-16 NOTE — Progress Notes (Signed)
Pt doing well. Pt given D/C instructions with verbal understanding. Rx's were sent to the pharmacy by MD. Pt's incision is clean and dry with no sign of infection. Pt's IV was removed prior to D/C. Pt D/C'd home via wheelchair per MD order. Pt is stable @ D/C and has no other needs at this time. Angelyn Osterberg, RN  

## 2020-02-16 NOTE — Evaluation (Signed)
Physical Therapy Evaluation Patient Details Name: Steven Scott MRN: [id number] DOB: 1945-10-03 Today's Date: 02/16/2020   History of Present Illness  The patient is a 74 year old white male with spinal stenosis and herniated disc at L3-4. He is now s/p Bilateral L3-4 laminotomy foraminotomies using microdissection; left L3-4 intervertebral discectomy using micro-dissection. Pt has a PMH including Arthritis, Atrial fibrillation, CAD, CKD, percutaneous angioplasty of renal artery (10/18 2012).  Clinical Impression  Pt in bed upon arrival of PT, agreeable to evaluation at this time. Prior to admission the pt was independent with use of a rollator for mobility, living alone, but with two daughters that live nearby. The pt now presents with minor limitations in functional mobility, endurance, and stability due to above dx, but is safe to return home with the assist of his daughters for a few days after d/c. The pt was able to demo good transfers and gait with supervision and use of RW for safety (pt uses rollator at home). He had one minor LOB during gait, but was able to correct without assistance. The pt was educated at length on functional application of back precautions, and on walking program to further progress functional endurance and stability. The pt is safe to d/c home at this point, all questions were answered, and the pt is mobilizing at a supervision level. The pt has no further acute PT needs, thank you for the consult.     Follow Up Recommendations No PT follow up;Supervision/Assistance - 24 hour    Equipment Recommendations  None recommended by PT    Recommendations for Other Services       Precautions / Restrictions Precautions Precautions: Back Precaution Booklet Issued: Yes (comment) Restrictions Weight Bearing Restrictions: No      Mobility  Bed Mobility Overal bed mobility:  (pt sitting EOB upon arrival)                Transfers Overall transfer level: Needs  assistance Equipment used: Rolling walker (2 wheeled) Transfers: Sit to/from Stand Sit to Stand: Min guard         General transfer comment: cues for safety, technique, application of spinal precautions  Ambulation/Gait Ambulation/Gait assistance: Min guard Gait Distance (Feet): 200 Feet Assistive device: Rolling walker (2 wheeled) Gait Pattern/deviations: Step-through pattern;Trunk flexed Gait velocity: decreased Gait velocity interpretation: <1.8 ft/sec, indicate of risk for recurrent falls General Gait Details: significant trunk flexion but improved with cues, able to self-corret by the end of session     Balance Overall balance assessment: Mild deficits observed, not formally tested                                           Pertinent Vitals/Pain Pain Assessment: Faces Faces Pain Scale: Hurts little more Pain Location: back Pain Descriptors / Indicators: Sore;Grimacing Pain Intervention(s): Limited activity within patient's tolerance;Monitored during session    Home Living Family/patient expects to be discharged to:: Private residence Living Arrangements: Alone Available Help at Discharge: Family;Available PRN/intermittently (2 daughters, home 24/7 for first few days, then intermittent) Type of Home: House Home Access: Ramped entrance     Home Layout: One level Home Equipment: [place] - 4 wheels;Shower seat - built in;Grab bars - toilet;Grab bars - tub/shower;Hand held shower head;Wheelchair - manual      Prior Function Level of Independence: Independent with assistive device(s)         Comments: Use  of rollator at home and in community, but otherwise independent with mobility and ADLs     Hand Dominance        Extremity/Trunk Assessment   Upper Extremity Assessment Upper Extremity Assessment: Defer to OT evaluation    Lower Extremity Assessment Lower Extremity Assessment: Overall WFL for tasks assessed (reports instances of RLE  buckling, but symmetrical MMT)    Cervical / Trunk Assessment Cervical / Trunk Assessment:  (s/p spinal surgery, ;arge body habitus)  Communication   Communication: HOH  Cognition Arousal/Alertness: Awake/alert Behavior During Therapy: WFL for tasks assessed/performed Overall Cognitive Status: Within Functional Limits for tasks assessed                                 General Comments: requires repeated cues for functional application of spinal precautions, but able to verbalize 3/3 precautions      General Comments General comments (skin integrity, edema, etc.): Repeated education on importance of back precautions and how to functionally avoid breaking them with everyday movements such as feeding his cat.     Exercises General Exercises - Lower Extremity Long Arc Quad: AROM;Both;5 reps;Seated Heel Raises: Strengthening;Both;10 reps;Seated;Standing   Assessment/Plan    PT Assessment Patent does not need any further PT services  PT Problem List         PT Treatment Interventions      PT Goals (Current goals can be found in the Care Plan section)  Acute Rehab PT Goals Patient Stated Goal: return home to his cat PT Goal Formulation: With patient Time For Goal Achievement: 03/01/20 Potential to Achieve Goals: Good     AM-PAC PT "6 Clicks" Mobility  Outcome Measure Help needed turning from your back to your side while in a flat bed without using bedrails?: A Little Help needed moving from lying on your back to sitting on the side of a flat bed without using bedrails?: A Little Help needed moving to and from a bed to a chair (including a wheelchair)?: None Help needed standing up from a chair using your arms (e.g., wheelchair or bedside chair)?: None Help needed to walk in hospital room?: A Little Help needed climbing 3-5 steps with a railing? : A Little 6 Click Score: 20    End of Session Equipment Utilized During Treatment: Gait belt Activity Tolerance:  Patient tolerated treatment well Patient left: in bed;with call bell/phone within reach (sitting EOB) Nurse Communication: Mobility status PT Visit Diagnosis: Difficulty in walking, not elsewhere classified (R26.2);Pain Pain - part of body:  (back)    Time: 8638-1771 PT Time Calculation (min) (ACUTE ONLY): 25 min   Charges:   PT Evaluation $PT Eval Moderate Complexity: 1 Mod PT Treatments $Gait Training: 8-22 mins        Karma Ganja, PT, DPT   Acute Rehabilitation Department Pager #: [phone]  Otho Bellows 02/16/2020, 9:34 AM

## 2020-02-16 NOTE — Discharge Instructions (Signed)

## 2020-02-16 NOTE — Evaluation (Signed)
Occupational Therapy Evaluation Patient Details Name: Steven Scott MRN: [id number] DOB: May 06, 1946 Today's Date: 02/16/2020    History of Present Illness The patient is a 74 year old male with spinal stenosis and herniated disc at L3-4. He is now s/p Bilateral L3-4 laminotomy foraminotomies using microdissection; left L3-4 intervertebral discectomy using micro-dissection. Pt has a PMH including Arthritis, Atrial fibrillation, CAD, CKD, percutaneous angioplasty of renal artery (10/18 2012).   Clinical Impression   Pt was mod I at home with mobility (uses Rollator) and ADL. Pt has fully handicapped accessible home due to his late wife. Today Pt was able to dress EOB at mostly min guard - cues for back precautions, and min A for LB dressing (Pt able to demonstrate figure 4 technique) Back handout provided and reviewed adls in detail Pt required cues throughout functional therapy session especially with mindfulness with twisting. Pt will have daughters staying with him initially providing 24/7 care. All education is complete and patient indicates understanding. OT to sign off at this time.     Follow Up Recommendations  No OT follow up;Supervision/Assistance - 24 hour (initially)    Equipment Recommendations  None recommended by OT (Pt has appropriate DME)    Recommendations for Other Services       Precautions / Restrictions Precautions Precautions: Back Precaution Booklet Issued: Yes (comment) Precaution Comments: reviewed in full with special emphasis on ADL Required Braces or Orthoses:  (no brace needed in orders) Restrictions Weight Bearing Restrictions: No      Mobility Bed Mobility Overal bed mobility:  (pt sitting EOB upon arrival)             General bed mobility comments: reviewed log roll technique on handout  Transfers Overall transfer level: Needs assistance Equipment used: Rolling walker (2 wheeled) Transfers: Sit to/from Stand Sit to Stand: Min guard          General transfer comment: cues for safety, technique, application of spinal precautions    Balance Overall balance assessment: Mild deficits observed, not formally tested                                         ADL either performed or assessed with clinical judgement   ADL Overall ADL's : Needs assistance/impaired Eating/Feeding: Independent   Grooming: Wash/dry hands;Min guard Grooming Details (indicate cue type and reason): reviewed grooming at sink with special emphasis on maintaining back precautions. educated on compensatory strategies (also listed on handout) Upper Body Bathing: Min guard;Sitting Upper Body Bathing Details (indicate cue type and reason): educated on long handle sponge Lower Body Bathing: Min guard;Sitting/lateral leans Lower Body Bathing Details (indicate cue type and reason): educated on long handle sponge Upper Body Dressing : Set up   Lower Body Dressing: Moderate assistance;Sit to/from stand;Min guard Lower Body Dressing Details (indicate cue type and reason): Pt able to perform figure 4 method and bring foot to opposing knee, OT assisting today due to pain. Daughters will be present the first 3/4 days Toilet Transfer: Min guard;Ambulation;RW   Toileting- Clothing Manipulation and Hygiene: Min guard;Sitting/lateral lean Toileting - Clothing Manipulation Details (indicate cue type and reason): educated on NOT twisting for rear peri care and also for flushing afterwards Tub/ Shower Transfer: Walk-in shower;Supervision/safety;Ambulation;Shower seat;Grab bars   Functional mobility during ADLs: Min guard;Rolling walker       Vision Patient Visual Report: No change from baseline  Perception     Praxis      Pertinent Vitals/Pain Pain Assessment: Faces Faces Pain Scale: Hurts little more Pain Location: back Pain Descriptors / Indicators: Sore;Grimacing Pain Intervention(s): Limited activity within patient's  tolerance;Monitored during session     Hand Dominance     Extremity/Trunk Assessment Upper Extremity Assessment Upper Extremity Assessment: Overall WFL for tasks assessed   Lower Extremity Assessment Lower Extremity Assessment: Defer to PT evaluation   Cervical / Trunk Assessment Cervical / Trunk Assessment:  (s/p spinal surgery, large body habitus/pannus)   Communication Communication Communication: HOH   Cognition Arousal/Alertness: Awake/alert Behavior During Therapy: WFL for tasks assessed/performed Overall Cognitive Status: Within Functional Limits for tasks assessed                                 General Comments: requires repeated cues for functional application of spinal precautions, but able to verbalize 3/3 precautions   General Comments  Repeated education on importance of back precautions and how to functionally avoid breaking them with everyday movements such as feeding his cat.     Exercises Exercises: General Lower Extremity General Exercises - Lower Extremity Long Arc Quad: AROM;Both;5 reps;Seated Heel Raises: Strengthening;Both;10 reps;Seated;Standing   Shoulder Instructions      Home Living Family/patient expects to be discharged to:: Private residence Living Arrangements: Alone (cat) Available Help at Discharge: Family;Available PRN/intermittently (2 daughters, home 24/7 for first few days, then intermittent) Type of Home: House Home Access: Ramped entrance     Home Layout: One level     Bathroom Shower/Tub: Occupational psychologist: Handicapped height Bathroom Accessibility: Yes How Accessible: Accessible via wheelchair Home Equipment: [place] - 4 wheels;Shower seat - built in;Grab bars - toilet;Grab bars - tub/shower;Hand held shower head;Wheelchair - manual          Prior Functioning/Environment Level of Independence: Independent with assistive device(s)        Comments: Use of rollator at home and in community,  but otherwise independent with mobility and ADLs        OT Problem List: Decreased activity tolerance;Impaired balance (sitting and/or standing);Decreased safety awareness;Decreased knowledge of use of DME or AE;Decreased knowledge of precautions;Obesity;Pain      OT Treatment/Interventions:      OT Goals(Current goals can be found in the care plan section) Acute Rehab OT Goals Patient Stated Goal: return home to his cat OT Goal Formulation: With patient Time For Goal Achievement: 03/01/20 Potential to Achieve Goals: Good  OT Frequency:     Barriers to D/C:            Co-evaluation              AM-PAC OT "6 Clicks" Daily Activity     Outcome Measure Help from another person eating meals?: None Help from another person taking care of personal grooming?: A Little Help from another person toileting, which includes using toliet, bedpan, or urinal?: A Little Help from another person bathing (including washing, rinsing, drying)?: A Little Help from another person to put on and taking off regular upper body clothing?: A Little Help from another person to put on and taking off regular lower body clothing?: A Little 6 Click Score: 19   End of Session Equipment Utilized During Treatment: Rolling walker Nurse Communication: Mobility status;Precautions  Activity Tolerance: Patient tolerated treatment well Patient left: Other (comment) (walking with PT in hallway)  OT Visit Diagnosis: Other abnormalities  of gait and mobility (R26.89);Pain Pain - part of body:  (lumbar spine)                Time: 5146-0479 OT Time Calculation (min): 24 min Charges:  OT General Charges $OT Visit: 1 Visit OT Evaluation $OT Eval Moderate Complexity: [place] OTR/L Acute Rehabilitation Services Pager: [phone] Office: [place] 02/16/2020, 10:43 AM

## 2020-03-10 DIAGNOSIS — E7849 Other hyperlipidemia: Secondary | ICD-10-CM | POA: Diagnosis not present

## 2020-03-10 DIAGNOSIS — I10 Essential (primary) hypertension: Secondary | ICD-10-CM | POA: Diagnosis not present

## 2020-03-13 DIAGNOSIS — R6889 Other general symptoms and signs: Secondary | ICD-10-CM | POA: Diagnosis not present

## 2020-03-22 DIAGNOSIS — I129 Hypertensive chronic kidney disease with stage 1 through stage 4 chronic kidney disease, or unspecified chronic kidney disease: Secondary | ICD-10-CM | POA: Diagnosis not present

## 2020-04-03 DIAGNOSIS — I129 Hypertensive chronic kidney disease with stage 1 through stage 4 chronic kidney disease, or unspecified chronic kidney disease: Secondary | ICD-10-CM | POA: Diagnosis not present

## 2020-04-03 DIAGNOSIS — R809 Proteinuria, unspecified: Secondary | ICD-10-CM | POA: Diagnosis not present

## 2020-04-03 DIAGNOSIS — E211 Secondary hyperparathyroidism, not elsewhere classified: Secondary | ICD-10-CM | POA: Diagnosis not present

## 2020-04-10 DIAGNOSIS — I10 Essential (primary) hypertension: Secondary | ICD-10-CM | POA: Diagnosis not present

## 2020-04-10 DIAGNOSIS — E782 Mixed hyperlipidemia: Secondary | ICD-10-CM | POA: Diagnosis not present

## 2020-04-11 ENCOUNTER — Other Ambulatory Visit: Payer: Self-pay | Admitting: Cardiovascular Disease

## 2020-04-13 DIAGNOSIS — Z0001 Encounter for general adult medical examination with abnormal findings: Secondary | ICD-10-CM | POA: Diagnosis not present

## 2020-04-13 DIAGNOSIS — I10 Essential (primary) hypertension: Secondary | ICD-10-CM | POA: Diagnosis not present

## 2020-04-13 DIAGNOSIS — E7849 Other hyperlipidemia: Secondary | ICD-10-CM | POA: Diagnosis not present

## 2020-04-13 DIAGNOSIS — I2510 Atherosclerotic heart disease of native coronary artery without angina pectoris: Secondary | ICD-10-CM | POA: Diagnosis not present

## 2020-04-13 DIAGNOSIS — E782 Mixed hyperlipidemia: Secondary | ICD-10-CM | POA: Diagnosis not present

## 2020-04-13 DIAGNOSIS — I119 Hypertensive heart disease without heart failure: Secondary | ICD-10-CM | POA: Diagnosis not present

## 2020-04-13 DIAGNOSIS — Z008 Encounter for other general examination: Secondary | ICD-10-CM | POA: Diagnosis not present

## 2020-05-02 ENCOUNTER — Other Ambulatory Visit: Payer: Self-pay

## 2020-05-17 ENCOUNTER — Other Ambulatory Visit: Payer: Self-pay | Admitting: Cardiovascular Disease

## 2020-05-30 ENCOUNTER — Ambulatory Visit: Payer: Medicare Other | Admitting: Cardiovascular Disease

## 2020-06-01 ENCOUNTER — Ambulatory Visit: Payer: Medicare Other | Admitting: Urology

## 2020-06-09 DIAGNOSIS — I10 Essential (primary) hypertension: Secondary | ICD-10-CM | POA: Diagnosis not present

## 2020-06-09 DIAGNOSIS — E782 Mixed hyperlipidemia: Secondary | ICD-10-CM | POA: Diagnosis not present

## 2020-06-13 DIAGNOSIS — I129 Hypertensive chronic kidney disease with stage 1 through stage 4 chronic kidney disease, or unspecified chronic kidney disease: Secondary | ICD-10-CM | POA: Diagnosis not present

## 2020-06-13 DIAGNOSIS — R809 Proteinuria, unspecified: Secondary | ICD-10-CM | POA: Diagnosis not present

## 2020-06-13 DIAGNOSIS — E211 Secondary hyperparathyroidism, not elsewhere classified: Secondary | ICD-10-CM | POA: Diagnosis not present

## 2020-06-19 ENCOUNTER — Other Ambulatory Visit: Payer: Self-pay | Admitting: Cardiovascular Disease

## 2020-06-19 DIAGNOSIS — I482 Chronic atrial fibrillation, unspecified: Secondary | ICD-10-CM

## 2020-06-22 DIAGNOSIS — R809 Proteinuria, unspecified: Secondary | ICD-10-CM | POA: Diagnosis not present

## 2020-06-22 DIAGNOSIS — I129 Hypertensive chronic kidney disease with stage 1 through stage 4 chronic kidney disease, or unspecified chronic kidney disease: Secondary | ICD-10-CM | POA: Diagnosis not present

## 2020-06-22 DIAGNOSIS — E211 Secondary hyperparathyroidism, not elsewhere classified: Secondary | ICD-10-CM | POA: Diagnosis not present

## 2020-06-26 ENCOUNTER — Other Ambulatory Visit: Payer: Self-pay

## 2020-06-26 ENCOUNTER — Encounter: Payer: Self-pay | Admitting: Urology

## 2020-06-26 ENCOUNTER — Ambulatory Visit (INDEPENDENT_AMBULATORY_CARE_PROVIDER_SITE_OTHER): Payer: Medicare Other | Admitting: Urology

## 2020-06-26 VITALS — BP 144/66 | HR 57 | Temp 98.4°F | Ht 71.0 in | Wt 254.0 lb

## 2020-06-26 DIAGNOSIS — N400 Enlarged prostate without lower urinary tract symptoms: Secondary | ICD-10-CM | POA: Diagnosis not present

## 2020-06-26 DIAGNOSIS — R972 Elevated prostate specific antigen [PSA]: Secondary | ICD-10-CM

## 2020-06-26 LAB — MICROSCOPIC EXAMINATION
Epithelial Cells (non renal): NONE SEEN /hpf (ref 0–10)
Renal Epithel, UA: NONE SEEN /hpf
WBC, UA: NONE SEEN /hpf (ref 0–5)

## 2020-06-26 LAB — URINALYSIS, ROUTINE W REFLEX MICROSCOPIC
Bilirubin, UA: NEGATIVE
Glucose, UA: NEGATIVE
Ketones, UA: NEGATIVE
Leukocytes,UA: NEGATIVE
Nitrite, UA: NEGATIVE
Specific Gravity, UA: 1.02 (ref 1.005–1.030)
Urobilinogen, Ur: 0.2 mg/dL (ref 0.2–1.0)
pH, UA: 7 (ref 5.0–7.5)

## 2020-06-26 NOTE — Progress Notes (Signed)
H&P  Chief Complaint: Elevated PSA  History of Present Illness:   2.15.2022: Steven Scott is here today following referral of his elevated PSA. He was last seen 3.5 years ago for BPH & elevated PSA. He denies any significant urinary complaints at this time.  PSA Trend Data: 09.15.11 - 3.47 09.25.12 - 2.91 04.29.14 - 3.91 10.27.15 - 4.46 06/13/2014--4.91 71 % free  12/21/2015--3.6  12/23/2016--3.3  12.03.21 - 5.5   IPSS Questionnaire (AUA-7): Over the past month.   1)  How often have you had a sensation of not emptying your bladder completely after you finish urinating?  3 - About half the time  2)  How often have you had to urinate again less than two hours after you finished urinating? 3 - About half the time  3)  How often have you found you stopped and started again several times when you urinated?  1 - Less than 1 time in 5  4) How difficult have you found it to postpone urination?  0 - Not at all  5) How often have you had a weak urinary stream?  1 - Less than 1 time in 5  6) How often have you had to push or strain to begin urination?  1 - Less than 1 time in 5  7) How many times did you most typically get up to urinate from the time you went to bed until the time you got up in the morning?  2 - 2 times  Total score:  0-7 mildly symptomatic   8-19 moderately symptomatic   20-35 severely symptomatic  IPSS: 11 QoL Score: 2   Past Medical History:  Diagnosis Date  . Arthritis   . Atrial fibrillation ([place]) 03/2015  . Carotid artery occlusion   . CKD (chronic kidney disease)   . Coronary artery disease   . GERD (gastroesophageal reflux disease)   . Headache   . History of bleeding ulcers   . History of kidney stones   . Hyperlipidemia   . Hypertension   . Pneumonia   . Renal artery stenosis ([facility])    Multiple interventions  . Seasonal allergies   . Shortness of breath    "at any time" (12/13/2012)  . Status post percutaneous angioplasty of renal artery 10/18 2012   PV  angiogram preformed revealing 90% right and 70% left in stent restenosis with 70mm and 20mm gradients respectively.    Past Surgical History:  Procedure Laterality Date  . CARDIAC CATHETERIZATION  03/02/2009   has moderate circumflex and diagonal branch disease with high-grade distal right disease and fail attempt at PCI  . COLONOSCOPY  12/18/2010   Procedure: COLONOSCOPY;  Surgeon: Rogene Houston, MD;  [facility];  Service: Endoscopy;  Laterality: N/A;  . COLONOSCOPY N/A 09/21/2019   Procedure: COLONOSCOPY;  Surgeon: Rogene Houston, MD;  [facility];  Service: Endoscopy;  Laterality: N/A;  1210  . CORONARY ANGIOPLASTY WITH STENT PLACEMENT  03/28/2009   Successful PCI od high-grade 95% eccentric tandem plague stenosis in the distal right coronary artery with ultimate insertion of a 3.0 x 18 mm Xience V DES stent postdilated at 3.46 mm with the 99% stenosis being reduced to 0%  . ENDARTERECTOMY Right 09/26/2015   Procedure: RIGHT CAROTID ARTERY ENDARTERECTOMY ;  Surgeon: Elam Dutch, MD;  Location: [facility] OR;  Service: Vascular;  Laterality: Right;  . ESOPHAGOGASTRODUODENOSCOPY N/A 11/08/2014   Procedure: ESOPHAGOGASTRODUODENOSCOPY (EGD);  Surgeon: Rogene Houston, MD;  [facility];  Service: Endoscopy;  Laterality: N/A;  . ESOPHAGOGASTRODUODENOSCOPY N/A 03/15/2015   Procedure: ESOPHAGOGASTRODUODENOSCOPY (EGD);  Surgeon: Rogene Houston, MD;  [facility];  Service: Endoscopy;  Laterality: N/A;  1255  . LITHOTRIPSY     "laser" (12/13/2012)  . LUMBAR LAMINECTOMY/DECOMPRESSION MICRODISCECTOMY Bilateral 02/15/2020   Procedure: BILATERAL LUMBAR THREE- LUMBAR FOUR LAMINOTOMY, FORAMINOTOMY, LEFT LUMBAR THREE- LUMBAR FOUR DISKECTOMY;  Surgeon: Newman Pies, MD;  Location: [place];  Service: Neurosurgery;  Laterality: Bilateral;  3C  . PATCH ANGIOPLASTY Right 09/26/2015   Procedure: WITH HEMASHIELD PATCH ANGIOPLASTY;  Surgeon: Elam Dutch, MD;   Location: [place];  Service: Vascular;  Laterality: Right;  . PERCUTANEOUS STENT INTERVENTION Left 12/14/2012   Procedure: PERCUTANEOUS STENT INTERVENTION;  Surgeon: [name], MD;  [facility] C[facility] LAB;  Service: Cardiovascular;  Laterality: Left;  left renal artery  . PERCUTANEOUS STENT INTERVENTION  02/16/2014   Procedure: PERCUTANEOUS STENT INTERVENTION;  Surgeon: [name], MD;  Location: [facility] CATH LAB;  Service: Cardiovascular;;  left renal  . POLYPECTOMY  09/21/2019   Procedure: POLYPECTOMY;  Surgeon: Rogene Houston, MD;  [facility];  Service: Endoscopy;;  . RENAL ANGIOGRAM N/A 12/14/2012   Procedure: RENAL ANGIOGRAM;  Surgeon: [name], MD;  Location: [facility] CATH LAB;  Service: Cardiovascular;  Laterality: N/A;  . RENAL ANGIOGRAM N/A 02/16/2014   Procedure: RENAL ANGIOGRAM;  Surgeon: [name], MD;  Location: [facility] CATH LAB;  Service: Cardiovascular;  Laterality: N/A;  . RENAL ARTERY ANGIOPLASTY Bilateral 02/2014   PV restenting L RA with an ICast covered stent and cutting balloon atherectomy R RA  . RENAL ARTERY STENT Bilateral 2010    Home Medications:  Allergies as of 06/26/2020      Reactions   Penicillins Rash      Medication List       Accurate as of June 26, 2020  1:45 PM. If you have any questions, ask your nurse or doctor.        aspirin EC 81 MG tablet Take 1 tablet (81 mg total) by mouth daily. Start on 11/15/14 What changed: when to take this   clobetasol cream 0.05 % Commonly known as: TEMOVATE Apply 1 application topically daily as needed (Psoriasis).   diphenhydrAMINE 25 MG tablet Commonly known as: BENADRYL Take 25 mg by mouth at bedtime.   docusate sodium 100 MG capsule Commonly known as: COLACE Take 1 capsule (100 mg total) by mouth 2 (two) times daily.   Eliquis 5 MG Tabs tablet Generic drug: apixaban TAKE (1) TABLET BY MOUTH TWICE DAILY.   nebivolol 5 MG tablet Commonly known as: BYSTOLIC Take 1 tablet (5 mg  total) by mouth every morning.   Olmesartan-amLODIPine-HCTZ 40-10-25 MG Tabs TAKE 1 TABLET BY MOUTH DAILY WITH BREAKFAST. What changed: when to take this   oxyCODONE-acetaminophen 5-325 MG tablet Commonly known as: PERCOCET/ROXICET Take 1 tablet by mouth every 4 (four) hours as needed for moderate pain.   pantoprazole 40 MG tablet Commonly known as: PROTONIX Take 1 tablet (40 mg total) by mouth daily before breakfast.   simvastatin 20 MG tablet Commonly known as: ZOCOR TAKE (1) TABLET BY MOUTH AT BEDTIME.       Allergies:  Allergies  Allergen Reactions  . Penicillins Rash    Family History  Problem Relation Age of Onset  . COPD Father 51       deceased    Social History:  reports that he quit  smoking about 9 years ago. His smoking use included cigarettes. He has a 100.00 pack-year smoking history. He has never used smokeless tobacco. He reports that he does not drink alcohol and does not use drugs.  ROS: A complete review of systems was performed.  All systems are negative except for pertinent findings as noted.  Physical Exam:  Vital signs in last 24 hours: There were no vitals taken for this visit. Constitutional:  Alert and oriented, No acute distress Cardiovascular: Regular rate  GI: Abdomen is soft, nontender, nondistended, no abdominal masses. No CVAT. No hernias Genitourinary: Normal male phallus, testes are descended bilaterally and non-tender and without masses, scrotum is normal in appearance without lesions or masses, perineum is normal on inspection. Prostate feels about 80 grams. Neurologic: Grossly intact, no focal deficits Psychiatric: Normal mood and affect  I have reviewed prior pt notes  I have reviewed notes from referring/previous physicians  I have reviewed urinalysis results  I have reviewed prior PSA results   Impression/Assessment:  Elevated PSA - Pt has a history of elevated PSA results with a stable trend. His elevation is felt to be  of benign cause at this time and he requires no further evaluation.  BPH w/o LUTS - DRE shows prostate is about 80 grams, but asymptomatic at this time.  Plan:  1. Pt reassured regarding his PSA results  2. F/U in 1 year for OV, DRE, and symptom recheck.  CC: Dr. Sharilyn Sites

## 2020-06-26 NOTE — Progress Notes (Signed)
Urological Symptom Review  Patient is experiencing the following symptoms: Frequent urination Hard to postpone urination Get up at night to urinate Leakage of urine   Review of Systems  Gastrointestinal (upper)  : Nausea  Gastrointestinal (lower) : Constipation  Constitutional : Fatigue  Skin: Negative for skin symptoms  Eyes: Blurred vision  Ear/Nose/Throat : Negative for Ear/Nose/Throat symptoms  Hematologic/Lymphatic: Easy bruising  Cardiovascular : Negative for cardiovascular symptoms  Respiratory : Cough Shortness of breath  Endocrine: Negative for endocrine symptoms  Musculoskeletal: Back pain Joint pain  Neurological: Dizziness  Psychologic: Depression

## 2020-07-03 DIAGNOSIS — R202 Paresthesia of skin: Secondary | ICD-10-CM | POA: Diagnosis not present

## 2020-07-03 DIAGNOSIS — I10 Essential (primary) hypertension: Secondary | ICD-10-CM | POA: Diagnosis not present

## 2020-07-03 DIAGNOSIS — R6889 Other general symptoms and signs: Secondary | ICD-10-CM | POA: Diagnosis not present

## 2020-07-03 DIAGNOSIS — R200 Anesthesia of skin: Secondary | ICD-10-CM | POA: Diagnosis not present

## 2020-07-09 DIAGNOSIS — E7849 Other hyperlipidemia: Secondary | ICD-10-CM | POA: Diagnosis not present

## 2020-07-09 DIAGNOSIS — I10 Essential (primary) hypertension: Secondary | ICD-10-CM | POA: Diagnosis not present

## 2020-07-13 ENCOUNTER — Other Ambulatory Visit: Payer: Self-pay | Admitting: Cardiovascular Disease

## 2020-07-18 ENCOUNTER — Other Ambulatory Visit: Payer: Self-pay

## 2020-07-18 ENCOUNTER — Encounter: Payer: Self-pay | Admitting: Cardiovascular Disease

## 2020-07-18 ENCOUNTER — Ambulatory Visit: Payer: Medicare Other | Admitting: Cardiovascular Disease

## 2020-07-18 DIAGNOSIS — Z9861 Coronary angioplasty status: Secondary | ICD-10-CM

## 2020-07-18 DIAGNOSIS — R6889 Other general symptoms and signs: Secondary | ICD-10-CM | POA: Diagnosis not present

## 2020-07-18 DIAGNOSIS — I2510 Atherosclerotic heart disease of native coronary artery without angina pectoris: Secondary | ICD-10-CM | POA: Diagnosis not present

## 2020-07-18 DIAGNOSIS — I701 Atherosclerosis of renal artery: Secondary | ICD-10-CM

## 2020-07-18 DIAGNOSIS — I10 Essential (primary) hypertension: Secondary | ICD-10-CM | POA: Diagnosis not present

## 2020-07-18 DIAGNOSIS — I482 Chronic atrial fibrillation, unspecified: Secondary | ICD-10-CM | POA: Diagnosis not present

## 2020-07-18 NOTE — Patient Instructions (Signed)
Medication Instructions:  Your physician recommends that you continue on your current medications as directed. Please refer to the Current Medication list given to you today.  *If you need a refill on your cardiac medications before your next appointment, please call your pharmacy*  Testing/Procedures: Your physician has requested that you have a renal artery duplex. During this test, an ultrasound is used to evaluate blood flow to the kidneys. Allow one hour for this exam. Do not eat after midnight the day before and avoid carbonated beverages. Take your medications as you usually do. This procedure is done at [place]. 2nd Floor. To be done Sept. 2022   Follow-Up: At [facility], you and your health needs are our priority.  As part of our continuing mission to provide you with exceptional heart care, we have created designated Provider Care Teams.  These Care Teams include your primary Cardiologist (physician) and Advanced Practice Providers (APPs -  Physician Assistants and Nurse Practitioners) who all work together to provide you with the care you need, when you need it.  We recommend signing up for the patient portal called "MyChart".  Sign up information is provided on this After Visit Summary.  MyChart is used to connect with patients for Virtual Visits (Telemedicine).  Patients are able to view lab/test results, encounter notes, upcoming appointments, etc.  Non-urgent messages can be sent to your provider as well.   To learn more about what you can do with MyChart, go to [email or web address].    Your next appointment:   12 month(s)  The format for your next appointment:   In Person  Provider:   Quay Burow, MD

## 2020-07-18 NOTE — Assessment & Plan Note (Signed)
History of essential hypertension a blood pressure measured today 143/65.  He is on Bystolic, olmesartan, amlodipine and hydrochlorothiazide.

## 2020-07-18 NOTE — Assessment & Plan Note (Signed)
History of bilateral renal artery stenting with 3 intervention multiple times as well as covered stenting on the left.  He has known occlusion of his right renal artery.  His most recent renal Doppler studies performed 01/18/2020 revealed a left renal aortic ratio of 4.28 which is stable with a left renal dimension of 11.43 cm.  We will continue to follow this on annual basis.

## 2020-07-18 NOTE — Assessment & Plan Note (Signed)
History of hyperlipidemia on statin therapy with lipid profile performed 04/13/2020 revealed a total cholesterol of 128

## 2020-07-18 NOTE — Progress Notes (Signed)
07/18/2020 Steven Scott   September 02, 1945  [id number]  Primary Physician Sharilyn Sites, MD Primary Cardiologist: [name] MD FACP, [place], [place], [name]  HPI:  Steven Scott is a 75 y.o.  moderately overweight, married Caucasian male, father of 2 who I last saw in the office  05/31/2019. He has a history of persistent hypertension status post bilateral renal artery PTA and stenting with a positive Myoview, as well as heart catheterization that showed a high-grade distal RCA stenosis. I stented both his renal arteries successfully but was unable to cross his distal RCA, which Dr. Claiborne Billings subsequently performed successfully on March 28, 2008, with a Xience V drug-eluting stent with an excellent result. He clinically improved. He had an excellent lipid profile. Renal Dopplers performed January 31, 2011, showed progression of disease in both renal arteries suggesting high "in-stent restenosis." I performed angiography on him October 18 revealing 90% right and 70% left in-stent restenosis with 60 and 40-mm gradients, respectively. He underwent AngioSculpt cutting balloon atherectomy of both renal arteries with an excellent angiographic and followup Doppler result. His blood pressure has remained stable. He is otherwise asymptomatic. He was changed to generic antihypertensive medications off of Tribenzor and apparently his blood pressures have been more difficult to control since that time. He does admit to dietary indiscretion with regards to salt as well.  I saw [name] 04/19/12. He denies chest pain or shortness of breath. Apparently his creatinine has slowly increased now to close to 2. Recent renal Dopplers performed in our office 11/19/12 suggest rapid progression of the in-stent restenosis and throat within the left renal artery stent with a renal/aortic ratio of 7.33. Based on this, I re-angiogram him on 12/14/12 revealing an 80-90% mid in-stent restenosis" within the left renal artery stent  which I restented with a ICast covered stent. His right renal artery had 60-70% ostial stenosis at that time. I performed cutting balloon angioplasty on the right renal artery in-stent restenosis.Followup Dopplers performed last month revealed normalization of his left renal artery to aortic ratio with mild progression of disease on the right. Since I saw him in the office 6 months ago he's done well . Renal Dopplers that suggest patent renal stents although his renal dimensions have mildly decreased. Because of high-grade right ICA stenosis I referred him to Dr. [name] performed elective endarterectomy on 09/26/15. He had minimal disease on the left. A Myoview stress test on prior to that was nonischemic .   Unfortunately, his wife of 17 years died February 23, 2019.  She had Alzheimer's for many years and he was taking care of her.  He does have 2 children that live in [place] but no grandchildren yet.  He did have renal Doppler studies performed 12/29/2018 that shows some progression of disease on the left with known occluded right renal artery.  His renal aortic ratio increased from 5.1-6.4.  Renal dimensions remained stable at 12.2 cm.  His blood pressure has been under good control.  Since I saw him a year ago he remained stable.  He did have a neurosurgical procedure by Dr. Arnoldo Morale for herniated disc.  He denies chest pain or shortness of breath.  His most recent renal Doppler study performed 01/18/2020 revealed a left renal aortic ratio of 4.28 with a stable left renal dimension of 11.43 cm.   Current Meds  Medication Sig  . aspirin EC 81 MG tablet Take 1 tablet (81 mg total) by mouth daily. Start on 11/15/14 (Patient taking differently:  Take 81 mg by mouth every morning. Start on 11/15/14)  . clobetasol cream (TEMOVATE) 6.94 % Apply 1 application topically daily as needed (Psoriasis).  . diphenhydrAMINE (BENADRYL) 25 MG tablet Take 25 mg by mouth at bedtime.  . docusate sodium (COLACE) 100 MG capsule  Take 1 capsule (100 mg total) by mouth 2 (two) times daily.  Marland Kitchen ELIQUIS 5 MG TABS tablet TAKE (1) TABLET BY MOUTH TWICE DAILY.  Marland Kitchen gabapentin (NEURONTIN) 300 MG capsule TAKE 1 CAPSULE BY MOUTHOTHREE TIMES A DAY.  Marland Kitchen HYDROcodone-acetaminophen (NORCO/VICODIN) 5-325 MG tablet Take 1 tablet by mouth every 6 (six) hours as needed.  . nebivolol (BYSTOLIC) 5 MG tablet Take 1 tablet (5 mg total) by mouth every morning.  . Olmesartan-amLODIPine-HCTZ 40-10-25 MG TABS TAKE 1 TABLET BY MOUTH DAILY WITH BREAKFAST.  . pantoprazole (PROTONIX) 40 MG tablet Take 1 tablet (40 mg total) by mouth daily before breakfast.  . simvastatin (ZOCOR) 20 MG tablet TAKE (1) TABLET BY MOUTH AT BEDTIME.     Allergies  Allergen Reactions  . Penicillins Rash    Social History   Socioeconomic History  . Marital status: Widowed    Spouse name: Not on file  . Number of children: 22  . Years of education: Not on file  . Highest education level: Not on file  Occupational History  . Occupation: retired  Tobacco Use  . Smoking status: Former Smoker    Packs/day: 2.00    Years: 50.00    Pack years: 100.00    Types: Cigarettes    Quit date: 02/24/2011    Years since quitting: 9.4  . Smokeless tobacco: Never Used  Vaping Use  . Vaping Use: Never used  Substance and Sexual Activity  . Alcohol use: No    Alcohol/week: 0.0 standard drinks    Comment: 12/13/2012 "last alcohol was ~ 4 yr ago"  . Drug use: No  . Sexual activity: Not Currently  Other Topics Concern  . Not on file  Social History Narrative  . Not on file   Social Determinants of Health   Financial Resource Strain: Not on file  Food Insecurity: Not on file  Transportation Needs: Not on file  Physical Activity: Not on file  Stress: Not on file  Social Connections: Not on file  Intimate Partner Violence: Not on file     Review of Systems: General: negative for chills, fever, night sweats or weight changes.  Cardiovascular: negative for chest pain,  dyspnea on exertion, edema, orthopnea, palpitations, paroxysmal nocturnal dyspnea or shortness of breath Dermatological: negative for rash Respiratory: negative for cough or wheezing Urologic: negative for hematuria Abdominal: negative for nausea, vomiting, diarrhea, bright red blood per rectum, melena, or hematemesis Neurologic: negative for visual changes, syncope, or dizziness All other systems reviewed and are otherwise negative except as noted above.    Blood pressure (!) 143/65, pulse 78, height 5\' 11"  (1.803 m), weight 259 lb 3.2 oz (117.6 kg), SpO2 96 %.  General appearance: alert and no distress Neck: no adenopathy, no carotid bruit, no JVD, supple, symmetrical, trachea midline and thyroid not enlarged, symmetric, no tenderness/mass/nodules Lungs: clear to auscultation bilaterally Heart: regular rate and rhythm, S1, S2 normal, no murmur, click, rub or gallop Extremities: extremities normal, atraumatic, no cyanosis or edema Pulses: 2+ and symmetric Skin: Skin color, texture, turgor normal. No rashes or lesions Neurologic: Alert and oriented X 3, normal strength and tone. Normal symmetric reflexes. Normal coordination and gait  EKG atrial fibrillation with ventricular sponsor 78  and septal Q waves with poor R wave progression and low limb voltage.  I personally reviewed this EKG.  ASSESSMENT AND PLAN:   Hyperlipidemia History of hyperlipidemia on statin therapy with lipid profile performed 04/13/2020 revealed a total cholesterol of 128  CAD -S/P RCA DES 2010 History of CAD status post distal RCA PCI and stenting using a Xience 5 drug-eluting stent by Dr. Claiborne Billings 03/28/2008.  He has been asymptomatic since he denies chest pain or shortness of breath.  He had a Myoview performed prior to his endarterectomy 09/20/2015 that was nonischemic.  Essential hypertension History of essential hypertension a blood pressure measured today 143/65.  He is on Bystolic, olmesartan, amlodipine and  hydrochlorothiazide.  Renal artery stenosis History of bilateral renal artery stenting with 3 intervention multiple times as well as covered stenting on the left.  He has known occlusion of his right renal artery.  His most recent renal Doppler studies performed 01/18/2020 revealed a left renal aortic ratio of 4.28 which is stable with a left renal dimension of 11.43 cm.  We will continue to follow this on annual basis.  Chronic atrial fibrillation (HCC) History of chronic A. fib rate controlled on Eliquis oral anticoagulation.      [name] MD FACP,FACC,FAHA, [facility] 07/18/2020 10:40 AM

## 2020-07-18 NOTE — Assessment & Plan Note (Signed)
History of chronic A. fib rate controlled on Eliquis oral anticoagulation 

## 2020-07-18 NOTE — Assessment & Plan Note (Addendum)
History of CAD status post distal RCA PCI and stenting using a Xience 5 drug-eluting stent by Dr. Claiborne Billings 03/28/2008.  He has been asymptomatic since he denies chest pain or shortness of breath.  He had a Myoview performed prior to his endarterectomy 09/20/2015 that was nonischemic.

## 2020-08-21 ENCOUNTER — Other Ambulatory Visit: Payer: Self-pay | Admitting: Cardiovascular Disease

## 2020-08-22 DIAGNOSIS — R809 Proteinuria, unspecified: Secondary | ICD-10-CM | POA: Diagnosis not present

## 2020-08-22 DIAGNOSIS — I129 Hypertensive chronic kidney disease with stage 1 through stage 4 chronic kidney disease, or unspecified chronic kidney disease: Secondary | ICD-10-CM | POA: Diagnosis not present

## 2020-08-22 DIAGNOSIS — E211 Secondary hyperparathyroidism, not elsewhere classified: Secondary | ICD-10-CM | POA: Diagnosis not present

## 2020-08-29 DIAGNOSIS — R809 Proteinuria, unspecified: Secondary | ICD-10-CM | POA: Diagnosis not present

## 2020-08-29 DIAGNOSIS — K219 Gastro-esophageal reflux disease without esophagitis: Secondary | ICD-10-CM | POA: Diagnosis not present

## 2020-08-29 DIAGNOSIS — I129 Hypertensive chronic kidney disease with stage 1 through stage 4 chronic kidney disease, or unspecified chronic kidney disease: Secondary | ICD-10-CM | POA: Diagnosis not present

## 2020-08-29 DIAGNOSIS — E211 Secondary hyperparathyroidism, not elsewhere classified: Secondary | ICD-10-CM | POA: Diagnosis not present

## 2020-10-09 DIAGNOSIS — I10 Essential (primary) hypertension: Secondary | ICD-10-CM | POA: Diagnosis not present

## 2020-10-09 DIAGNOSIS — E782 Mixed hyperlipidemia: Secondary | ICD-10-CM | POA: Diagnosis not present

## 2020-10-12 ENCOUNTER — Other Ambulatory Visit: Payer: Self-pay | Admitting: Cardiovascular Disease

## 2020-10-30 DIAGNOSIS — E211 Secondary hyperparathyroidism, not elsewhere classified: Secondary | ICD-10-CM | POA: Diagnosis not present

## 2020-11-07 DIAGNOSIS — R809 Proteinuria, unspecified: Secondary | ICD-10-CM | POA: Diagnosis not present

## 2020-11-07 DIAGNOSIS — I129 Hypertensive chronic kidney disease with stage 1 through stage 4 chronic kidney disease, or unspecified chronic kidney disease: Secondary | ICD-10-CM | POA: Diagnosis not present

## 2020-11-08 DIAGNOSIS — I10 Essential (primary) hypertension: Secondary | ICD-10-CM | POA: Diagnosis not present

## 2020-11-08 DIAGNOSIS — E782 Mixed hyperlipidemia: Secondary | ICD-10-CM | POA: Diagnosis not present

## 2021-01-02 DIAGNOSIS — H524 Presbyopia: Secondary | ICD-10-CM | POA: Diagnosis not present

## 2021-01-02 DIAGNOSIS — H25813 Combined forms of age-related cataract, bilateral: Secondary | ICD-10-CM | POA: Diagnosis not present

## 2021-01-02 DIAGNOSIS — H5203 Hypermetropia, bilateral: Secondary | ICD-10-CM | POA: Diagnosis not present

## 2021-01-10 ENCOUNTER — Other Ambulatory Visit: Payer: Self-pay

## 2021-01-10 ENCOUNTER — Ambulatory Visit (HOSPITAL_COMMUNITY)
Admission: RE | Admit: 2021-01-10 | Discharge: 2021-01-10 | Disposition: A | Discharge status: Home / Self Care | Payer: Medicare Other | Source: Ambulatory Visit | Attending: Internal Medicine | Admitting: Internal Medicine

## 2021-01-10 DIAGNOSIS — I701 Atherosclerosis of renal artery: Secondary | ICD-10-CM | POA: Diagnosis not present

## 2021-01-10 DIAGNOSIS — R6889 Other general symptoms and signs: Secondary | ICD-10-CM | POA: Diagnosis not present

## 2021-01-16 DIAGNOSIS — R809 Proteinuria, unspecified: Secondary | ICD-10-CM | POA: Diagnosis not present

## 2021-01-16 DIAGNOSIS — I129 Hypertensive chronic kidney disease with stage 1 through stage 4 chronic kidney disease, or unspecified chronic kidney disease: Secondary | ICD-10-CM | POA: Diagnosis not present

## 2021-01-17 ENCOUNTER — Encounter: Payer: Self-pay | Admitting: Cardiovascular Disease

## 2021-01-17 ENCOUNTER — Other Ambulatory Visit: Payer: Self-pay | Admitting: *Deleted

## 2021-01-17 DIAGNOSIS — I701 Atherosclerosis of renal artery: Secondary | ICD-10-CM

## 2021-01-24 DIAGNOSIS — I701 Atherosclerosis of renal artery: Secondary | ICD-10-CM | POA: Diagnosis not present

## 2021-01-24 DIAGNOSIS — I129 Hypertensive chronic kidney disease with stage 1 through stage 4 chronic kidney disease, or unspecified chronic kidney disease: Secondary | ICD-10-CM | POA: Diagnosis not present

## 2021-01-24 DIAGNOSIS — R809 Proteinuria, unspecified: Secondary | ICD-10-CM | POA: Diagnosis not present

## 2021-01-29 ENCOUNTER — Other Ambulatory Visit (HOSPITAL_COMMUNITY): Payer: Self-pay | Admitting: Neurosurgery

## 2021-01-29 ENCOUNTER — Other Ambulatory Visit: Payer: Self-pay | Admitting: Neurosurgery

## 2021-01-29 DIAGNOSIS — M5442 Lumbago with sciatica, left side: Secondary | ICD-10-CM | POA: Diagnosis not present

## 2021-01-29 DIAGNOSIS — M5441 Lumbago with sciatica, right side: Secondary | ICD-10-CM | POA: Diagnosis not present

## 2021-01-29 DIAGNOSIS — I10 Essential (primary) hypertension: Secondary | ICD-10-CM | POA: Diagnosis not present

## 2021-01-29 DIAGNOSIS — G8929 Other chronic pain: Secondary | ICD-10-CM | POA: Diagnosis not present

## 2021-01-30 DIAGNOSIS — Z01818 Encounter for other preprocedural examination: Secondary | ICD-10-CM | POA: Diagnosis not present

## 2021-01-30 DIAGNOSIS — H25811 Combined forms of age-related cataract, right eye: Secondary | ICD-10-CM | POA: Diagnosis not present

## 2021-01-31 ENCOUNTER — Other Ambulatory Visit: Payer: Self-pay | Admitting: Cardiovascular Disease

## 2021-01-31 DIAGNOSIS — I482 Chronic atrial fibrillation, unspecified: Secondary | ICD-10-CM

## 2021-01-31 NOTE — Telephone Encounter (Signed)
Prescription refill request for Eliquis received.  Indication: afib  Last office visit: 07/18/2020, [place] Scr: 1.88, 02/13/2020 Age: 75 yo  Weight: 117.6 kg   Refill sent.

## 2021-02-25 DIAGNOSIS — H2511 Age-related nuclear cataract, right eye: Secondary | ICD-10-CM | POA: Diagnosis not present

## 2021-02-25 DIAGNOSIS — H25811 Combined forms of age-related cataract, right eye: Secondary | ICD-10-CM | POA: Diagnosis not present

## 2021-03-08 ENCOUNTER — Ambulatory Visit (HOSPITAL_COMMUNITY): Payer: Medicare Other

## 2021-03-08 ENCOUNTER — Encounter (HOSPITAL_COMMUNITY): Payer: Self-pay

## 2021-03-14 ENCOUNTER — Other Ambulatory Visit: Payer: Self-pay | Admitting: Cardiovascular Disease

## 2021-03-14 DIAGNOSIS — I482 Chronic atrial fibrillation, unspecified: Secondary | ICD-10-CM

## 2021-03-14 NOTE — Telephone Encounter (Signed)
Prescription refill request for Eliquis received. Indication:Afib Last office visit:3/22 WKG:SUPJS labs Age: 75 Weight:117.6 kg  Prescription refilled

## 2021-03-22 DIAGNOSIS — H25812 Combined forms of age-related cataract, left eye: Secondary | ICD-10-CM | POA: Diagnosis not present

## 2021-04-09 ENCOUNTER — Ambulatory Visit (HOSPITAL_COMMUNITY)
Admission: RE | Admit: 2021-04-09 | Discharge: 2021-04-09 | Disposition: A | Discharge status: Home / Self Care | Payer: Medicare Other | Source: Ambulatory Visit | Attending: Neurosurgery | Admitting: Neurosurgery

## 2021-04-09 ENCOUNTER — Other Ambulatory Visit: Payer: Self-pay

## 2021-04-09 DIAGNOSIS — M5442 Lumbago with sciatica, left side: Secondary | ICD-10-CM | POA: Diagnosis not present

## 2021-04-09 DIAGNOSIS — M48061 Spinal stenosis, lumbar region without neurogenic claudication: Secondary | ICD-10-CM | POA: Diagnosis not present

## 2021-04-09 DIAGNOSIS — M5441 Lumbago with sciatica, right side: Secondary | ICD-10-CM | POA: Diagnosis not present

## 2021-04-09 DIAGNOSIS — G8929 Other chronic pain: Secondary | ICD-10-CM | POA: Insufficient documentation

## 2021-04-09 MED ORDER — GADOBUTROL 1 MMOL/ML IV SOLN
10.0000 mL | Freq: Once | INTRAVENOUS | Status: AC | PRN
  Administered 2021-04-09: 9 mL via INTRAVENOUS

## 2021-05-16 DIAGNOSIS — I129 Hypertensive chronic kidney disease with stage 1 through stage 4 chronic kidney disease, or unspecified chronic kidney disease: Secondary | ICD-10-CM | POA: Diagnosis not present

## 2021-05-16 DIAGNOSIS — R809 Proteinuria, unspecified: Secondary | ICD-10-CM | POA: Diagnosis not present

## 2021-05-16 DIAGNOSIS — I10 Essential (primary) hypertension: Secondary | ICD-10-CM | POA: Diagnosis not present

## 2021-05-16 DIAGNOSIS — N184 Chronic kidney disease, stage 4 (severe): Secondary | ICD-10-CM | POA: Diagnosis not present

## 2021-05-16 DIAGNOSIS — Z0000 Encounter for general adult medical examination without abnormal findings: Secondary | ICD-10-CM | POA: Diagnosis not present

## 2021-05-16 DIAGNOSIS — I2510 Atherosclerotic heart disease of native coronary artery without angina pectoris: Secondary | ICD-10-CM | POA: Diagnosis not present

## 2021-05-16 DIAGNOSIS — E782 Mixed hyperlipidemia: Secondary | ICD-10-CM | POA: Diagnosis not present

## 2021-05-16 DIAGNOSIS — E7849 Other hyperlipidemia: Secondary | ICD-10-CM | POA: Diagnosis not present

## 2021-05-16 DIAGNOSIS — R7309 Other abnormal glucose: Secondary | ICD-10-CM | POA: Diagnosis not present

## 2021-05-17 ENCOUNTER — Other Ambulatory Visit: Payer: Self-pay | Admitting: Cardiovascular Disease

## 2021-05-17 DIAGNOSIS — I482 Chronic atrial fibrillation, unspecified: Secondary | ICD-10-CM

## 2021-05-17 NOTE — Telephone Encounter (Signed)
Prescription refill request for Eliquis received. Indication:Afib Last office visit:3/22 ASN:KNLZJ Labs Age: 76 Weight:117.4 kg  Prescription refilled

## 2021-05-23 DIAGNOSIS — E870 Hyperosmolality and hypernatremia: Secondary | ICD-10-CM | POA: Diagnosis not present

## 2021-05-23 DIAGNOSIS — I129 Hypertensive chronic kidney disease with stage 1 through stage 4 chronic kidney disease, or unspecified chronic kidney disease: Secondary | ICD-10-CM | POA: Diagnosis not present

## 2021-05-23 DIAGNOSIS — R809 Proteinuria, unspecified: Secondary | ICD-10-CM | POA: Diagnosis not present

## 2021-06-05 ENCOUNTER — Other Ambulatory Visit: Payer: Self-pay | Admitting: Cardiovascular Disease

## 2021-06-05 DIAGNOSIS — I482 Chronic atrial fibrillation, unspecified: Secondary | ICD-10-CM

## 2021-06-05 NOTE — Telephone Encounter (Signed)
Prescription refill request for Eliquis received. Indication:Afib Last office visit:3/22 LKG:MWNUU Labs Age: 76 Weight:117.6 kg  Prescription refilled

## 2021-06-21 ENCOUNTER — Other Ambulatory Visit: Payer: Self-pay | Admitting: Cardiovascular Disease

## 2021-06-21 DIAGNOSIS — I482 Chronic atrial fibrillation, unspecified: Secondary | ICD-10-CM

## 2021-06-21 NOTE — Telephone Encounter (Signed)
Prescription refill request for Eliquis received. Indication:Afib Last office visit:3/22 SUO:RVIFB Labs Age: 76 Weight:117.6 kg  Prescription refilled

## 2021-06-25 ENCOUNTER — Other Ambulatory Visit: Payer: Self-pay

## 2021-06-25 ENCOUNTER — Ambulatory Visit: Payer: Medicare Other | Admitting: Urology

## 2021-06-25 ENCOUNTER — Encounter: Payer: Self-pay | Admitting: Urology

## 2021-06-25 VITALS — BP 114/61 | HR 65 | Wt 250.0 lb

## 2021-06-25 DIAGNOSIS — N400 Enlarged prostate without lower urinary tract symptoms: Secondary | ICD-10-CM

## 2021-06-25 DIAGNOSIS — N289 Disorder of kidney and ureter, unspecified: Secondary | ICD-10-CM

## 2021-06-25 DIAGNOSIS — R972 Elevated prostate specific antigen [PSA]: Secondary | ICD-10-CM | POA: Diagnosis not present

## 2021-06-25 NOTE — Progress Notes (Signed)
History of Present Illness:   2.15.2022: Steven Scott is here today following referral of his elevated PSA. He was last seen 3.5 years ago for BPH & elevated PSA. He denies any significant urinary complaints at this time.   PSA Trend Data: 09.15.11 - 3.47 09.25.12 - 2.91 04.29.14 - 3.91 10.27.15 - 4.46 06/13/2014--4.91 71 % free  12/21/2015--3.6  12/23/2016--3.3  12.03.21 - 5.5 I recommended continued follow-up with DRE/PSA without for ultrasound and biopsy at this time.  2.14.2023: No real urinary complaints over the past year.  He is followed by a nephrologist for renal insufficiency.  He has had a stent placed in his left renal artery.  He has an atrophic right kidney.  He has questions about his laboratories that he brought from his nephrologist.  He has not had a PSA checked over the past year.  Past Medical History:  Diagnosis Date   Arthritis    Atrial fibrillation ([place]) 03/2015   Carotid artery occlusion    CKD (chronic kidney disease)    Coronary artery disease    GERD (gastroesophageal reflux disease)    Headache    History of bleeding ulcers    History of kidney stones    Hyperlipidemia    Hypertension    Kidney stone    Pneumonia    Renal artery stenosis (HCC)    Multiple interventions   Seasonal allergies    Shortness of breath    "at any time" (12/13/2012)   Status post percutaneous angioplasty of renal artery 10/18 2012   PV angiogram preformed revealing 90% right and 70% left in stent restenosis with 22mm and 21mm gradients respectively.    Past Surgical History:  Procedure Laterality Date   CARDIAC CATHETERIZATION  03/02/2009   has moderate circumflex and diagonal branch disease with high-grade distal right disease and fail attempt at PCI   COLONOSCOPY  12/18/2010   Procedure: COLONOSCOPY;  Surgeon: Rogene Houston, MD;  [facility];  Service: Endoscopy;  Laterality: N/A;   COLONOSCOPY N/A 09/21/2019   Procedure: COLONOSCOPY;  Surgeon: Rogene Houston,  MD;  [facility];  Service: Endoscopy;  Laterality: N/A;  1210   CORONARY ANGIOPLASTY WITH STENT PLACEMENT  03/28/2009   Successful PCI od high-grade 95% eccentric tandem plague stenosis in the distal right coronary artery with ultimate insertion of a 3.0 x 18 mm Xience V DES stent postdilated at 3.46 mm with the 99% stenosis being reduced to 0%   ENDARTERECTOMY Right 09/26/2015   Procedure: RIGHT CAROTID ARTERY ENDARTERECTOMY ;  Surgeon: Elam Dutch, MD;  Location: [facility] OR;  Service: Vascular;  Laterality: Right;   ESOPHAGOGASTRODUODENOSCOPY N/A 11/08/2014   Procedure: ESOPHAGOGASTRODUODENOSCOPY (EGD);  Surgeon: Rogene Houston, MD;  [facility];  Service: Endoscopy;  Laterality: N/A;   ESOPHAGOGASTRODUODENOSCOPY N/A 03/15/2015   Procedure: ESOPHAGOGASTRODUODENOSCOPY (EGD);  Surgeon: Rogene Houston, MD;  [facility];  Service: Endoscopy;  Laterality: N/A;  1255   LITHOTRIPSY     "laser" (12/13/2012)   LUMBAR LAMINECTOMY/DECOMPRESSION MICRODISCECTOMY Bilateral 02/15/2020   Procedure: BILATERAL LUMBAR THREE- LUMBAR FOUR LAMINOTOMY, FORAMINOTOMY, LEFT LUMBAR THREE- LUMBAR FOUR DISKECTOMY;  Surgeon: Newman Pies, MD;  Location: [place];  Service: Neurosurgery;  Laterality: Bilateral;  3C   PATCH ANGIOPLASTY Right 09/26/2015   Procedure: WITH HEMASHIELD PATCH ANGIOPLASTY;  Surgeon: Elam Dutch, MD;  Location: [place];  Service: Vascular;  Laterality: Right;   PERCUTANEOUS STENT INTERVENTION Left 12/14/2012   Procedure: PERCUTANEOUS STENT INTERVENTION;  Surgeon: [name], MD;  Location: [facility] CATH LAB;  Service: Cardiovascular;  Laterality: Left;  left renal artery   PERCUTANEOUS STENT INTERVENTION  02/16/2014   Procedure: PERCUTANEOUS STENT INTERVENTION;  Surgeon: [name], MD;  Location: [facility] CATH LAB;  Service: Cardiovascular;;  left renal   POLYPECTOMY  09/21/2019   Procedure: POLYPECTOMY;  Surgeon: Rogene Houston, MD;  [facility];   Service: Endoscopy;;   RENAL ANGIOGRAM N/A 12/14/2012   Procedure: RENAL ANGIOGRAM;  Surgeon: [name], MD;  Location: [facility] CATH LAB;  Service: Cardiovascular;  Laterality: N/A;   RENAL ANGIOGRAM N/A 02/16/2014   Procedure: RENAL ANGIOGRAM;  Surgeon: [name], MD;  Location[facility] CATH LAB;  Service: Cardiovascular;  Laterality: N/A;   RENAL ARTERY ANGIOPLASTY Bilateral 02/2014   PV restenting L RA with an ICast covered stent and cutting balloon atherectomy R RA   RENAL ARTERY STENT Bilateral 2010    Home Medications:  Allergies as of 06/25/2021       Reactions   Penicillins Rash        Medication List        Accurate as of June 25, 2021  8:08 AM. If you have any questions, ask your nurse or doctor.          aspirin EC 81 MG tablet Take 1 tablet (81 mg total) by mouth daily. Start on 11/15/14 What changed: when to take this   clobetasol cream 0.05 % Commonly known as: TEMOVATE Apply 1 application topically daily as needed (Psoriasis).   diphenhydrAMINE 25 MG tablet Commonly known as: BENADRYL Take 25 mg by mouth at bedtime.   docusate sodium 100 MG capsule Commonly known as: COLACE Take 1 capsule (100 mg total) by mouth 2 (two) times daily.   Eliquis 5 MG Tabs tablet Generic drug: apixaban TAKE (1) TABLET BY MOUTH TWICE DAILY.   gabapentin 300 MG capsule Commonly known as: NEURONTIN TAKE 1 CAPSULE BY MOUTHOTHREE TIMES A DAY.   HYDROcodone-acetaminophen 5-325 MG tablet Commonly known as: NORCO/VICODIN Take 1 tablet by mouth every 6 (six) hours as needed.   nebivolol 5 MG tablet Commonly known as: BYSTOLIC Take 1 tablet (5 mg total) by mouth every morning.   Olmesartan-amLODIPine-HCTZ 40-10-25 MG Tabs TAKE 1 TABLET BY MOUTH DAILY WITH BREAKFAST.   pantoprazole 40 MG tablet Commonly known as: PROTONIX Take 1 tablet (40 mg total) by mouth daily before breakfast.   simvastatin 20 MG tablet Commonly known as: ZOCOR TAKE (1) TABLET BY MOUTH AT  BEDTIME.        Allergies:  Allergies  Allergen Reactions   Penicillins Rash    Family History  Problem Relation Age of Onset   COPD Father 52       deceased   Kidney failure Father     Social History:  reports that he quit smoking about 10 years ago. His smoking use included cigarettes. He has a 100.00 pack-year smoking history. He has never used smokeless tobacco. He reports that he does not drink alcohol and does not use drugs.  ROS: A complete review of systems was performed.  All systems are negative except for pertinent findings as noted.  Physical Exam:  Vital signs in last 24 hours: There were no vitals taken for this visit. Constitutional:  Alert and oriented, No acute distress Cardiovascular: Regular rate  Respiratory: Normal respiratory effort GI: Abdomen is obese.  No inguinal hernias Genitourinary: Normal uncircumcised/buried male phallus, testes are descended bilaterally and  non-tender and without masses, scrotum is normal in appearance without lesions or masses, perineum is normal on inspection.  Normal sphincter tone.  Prostate 50 g, symmetric, nonnodular nontender. Lymphatic: No lymphadenopathy Neurologic: Grossly intact, no focal deficits Psychiatric: Normal mood and affect  I have reviewed prior pt notes  I have reviewed notes from referring/previous physicians/labs from nephrologist  I have reviewed urinalysis results-clear  I have independently reviewed prior imaging-we reviewed his most recent CT scan  I have reviewed prior PSA results    Impression/Assessment:  1.  Elevated PSA.  Fairly normal for age.  Stable exam no significant lower urinary tract symptoms  2.  Renal insufficiency, followed elsewhere by nephrologist  Plan:  1.  Reassured about exam  2.  PSA today  3.  If he desires, I will see him back annually

## 2021-06-26 LAB — MICROSCOPIC EXAMINATION
Epithelial Cells (non renal): NONE SEEN /hpf (ref 0–10)
Renal Epithel, UA: NONE SEEN /hpf
WBC, UA: NONE SEEN /hpf (ref 0–5)

## 2021-06-26 LAB — URINALYSIS, ROUTINE W REFLEX MICROSCOPIC
Bilirubin, UA: NEGATIVE
Glucose, UA: NEGATIVE
Ketones, UA: NEGATIVE
Leukocytes,UA: NEGATIVE
Nitrite, UA: NEGATIVE
Specific Gravity, UA: 1.025 (ref 1.005–1.030)
Urobilinogen, Ur: 1 mg/dL (ref 0.2–1.0)
pH, UA: 6 (ref 5.0–7.5)

## 2021-06-26 LAB — PSA: Prostate Specific Ag, Serum: 5.3 ng/mL — ABNORMAL HIGH (ref 0.0–4.0)

## 2021-07-11 ENCOUNTER — Other Ambulatory Visit: Payer: Self-pay | Admitting: Cardiovascular Disease

## 2021-07-11 DIAGNOSIS — I482 Chronic atrial fibrillation, unspecified: Secondary | ICD-10-CM

## 2021-07-11 NOTE — Telephone Encounter (Signed)
Prescription refill request for Eliquis received. ?Indication:Afib ?Last office visit:upcoming ?Scr:2.0 ?Age: 76 ?Weight:113.4 kg ? ?Prescription refilled ? ?

## 2021-07-24 DIAGNOSIS — Z980 Intestinal bypass and anastomosis status: Secondary | ICD-10-CM | POA: Diagnosis not present

## 2021-07-24 DIAGNOSIS — I129 Hypertensive chronic kidney disease with stage 1 through stage 4 chronic kidney disease, or unspecified chronic kidney disease: Secondary | ICD-10-CM | POA: Diagnosis not present

## 2021-07-24 DIAGNOSIS — R799 Abnormal finding of blood chemistry, unspecified: Secondary | ICD-10-CM | POA: Diagnosis not present

## 2021-07-24 DIAGNOSIS — E870 Hyperosmolality and hypernatremia: Secondary | ICD-10-CM | POA: Diagnosis not present

## 2021-07-24 DIAGNOSIS — R809 Proteinuria, unspecified: Secondary | ICD-10-CM | POA: Diagnosis not present

## 2021-07-29 ENCOUNTER — Telehealth: Payer: Self-pay

## 2021-07-29 ENCOUNTER — Other Ambulatory Visit: Payer: Self-pay | Admitting: Cardiovascular Disease

## 2021-07-29 DIAGNOSIS — I482 Chronic atrial fibrillation, unspecified: Secondary | ICD-10-CM

## 2021-07-29 MED ORDER — APIXABAN 5 MG PO TABS: ORAL_TABLET | ORAL | 6 refills | Status: DC

## 2021-07-29 NOTE — Telephone Encounter (Signed)
Prescription refill request for Eliquis received. ?Indication:Afib ?Last office visit:upcoming ?Scr:2.0 ?Age: 76 ?Weight:113.4 kg ? ?Prescription refilled ? ?

## 2021-07-29 NOTE — Telephone Encounter (Signed)
Spoke with pt regarding change in appointment time. Pt states that it looks like his prescription for eliquis was sent in incorrectly, pt only received 30 pills for a month. Refilled medication to get 60 tablets per month. Pt verbalizes understanding and is appreciative.  ?

## 2021-07-30 ENCOUNTER — Ambulatory Visit: Payer: Medicare Other | Admitting: Cardiovascular Disease

## 2021-07-31 DIAGNOSIS — I129 Hypertensive chronic kidney disease with stage 1 through stage 4 chronic kidney disease, or unspecified chronic kidney disease: Secondary | ICD-10-CM | POA: Diagnosis not present

## 2021-07-31 DIAGNOSIS — E870 Hyperosmolality and hypernatremia: Secondary | ICD-10-CM | POA: Diagnosis not present

## 2021-07-31 DIAGNOSIS — E211 Secondary hyperparathyroidism, not elsewhere classified: Secondary | ICD-10-CM | POA: Diagnosis not present

## 2021-07-31 DIAGNOSIS — R809 Proteinuria, unspecified: Secondary | ICD-10-CM | POA: Diagnosis not present

## 2021-07-31 DIAGNOSIS — E611 Iron deficiency: Secondary | ICD-10-CM | POA: Diagnosis not present

## 2021-08-27 ENCOUNTER — Other Ambulatory Visit: Payer: Self-pay | Admitting: Cardiovascular Disease

## 2021-09-04 ENCOUNTER — Encounter: Payer: Self-pay | Admitting: Cardiovascular Disease

## 2021-09-04 ENCOUNTER — Ambulatory Visit: Payer: Medicare Other | Admitting: Cardiovascular Disease

## 2021-09-04 DIAGNOSIS — E782 Mixed hyperlipidemia: Secondary | ICD-10-CM | POA: Diagnosis not present

## 2021-09-04 DIAGNOSIS — I2510 Atherosclerotic heart disease of native coronary artery without angina pectoris: Secondary | ICD-10-CM

## 2021-09-04 DIAGNOSIS — I482 Chronic atrial fibrillation, unspecified: Secondary | ICD-10-CM

## 2021-09-04 DIAGNOSIS — I701 Atherosclerosis of renal artery: Secondary | ICD-10-CM

## 2021-09-04 DIAGNOSIS — Z9861 Coronary angioplasty status: Secondary | ICD-10-CM

## 2021-09-04 DIAGNOSIS — I10 Essential (primary) hypertension: Secondary | ICD-10-CM

## 2021-09-04 DIAGNOSIS — I6523 Occlusion and stenosis of bilateral carotid arteries: Secondary | ICD-10-CM

## 2021-09-04 MED ORDER — ATORVASTATIN CALCIUM 40 MG PO TABS: 40.0000 mg | ORAL_TABLET | Freq: Every day | ORAL | 3 refills | Status: DC

## 2021-09-04 NOTE — Assessment & Plan Note (Signed)
History of CAD status post distal RCA intervention by Dr. Claiborne Billings 03/28/2008 with a Xience drug-eluting stent.  He said no recurrent symptoms.  He did have a Myoview prior to his carotid endarterectomy for preoperative clearance May 2017 that was nonischemic. ?

## 2021-09-04 NOTE — Assessment & Plan Note (Signed)
History of hyperlipidemia on simvastatin 20 mg a day with recent lipid profile performed 05/16/2021 revealing a total cholesterol of 165, LDL 104 and HDL 39.  He is not at goal for secondary prevention.  I am going to change his simvastatin to atorvastatin 40 mg a day and we will recheck a lipid liver profile in 3 months. ?

## 2021-09-04 NOTE — Assessment & Plan Note (Signed)
History of bilateral renal artery stenting with reintervention 12/14/2012 using cutting balloon on the right and iCAST covered stent on the left.  His right renal artery has subsequently closed we are following his left renal artery by duplex ultrasound most recently performed 01/10/2021 suggesting a patent left renal artery stent with a renal aortic ratio 4.1 and a left kidney dimension of 12.44 which has remained stable.  We will continue to follow him by renal duplex on annual basis. ?

## 2021-09-04 NOTE — Patient Instructions (Signed)
Medication Instructions:  ? ?-Stop simvastatin (zocor). ? ?-Start atorvastatin (lipitor) '40mg'$  once daily. ? ? ?*If you need a refill on your cardiac medications before your next appointment, please call your pharmacy* ? ? ?Lab Work: ?Your physician recommends that you return for lab work in: 3 months for FASTING lipid/liver profile.  ? ?If you have labs (blood work) drawn today and your tests are completely normal, you will receive your results only by: ?MyChart Message (if you have MyChart) OR ?A paper copy in the mail ?If you have any lab test that is abnormal or we need to change your treatment, we will call you to review the results. ? ? ?Testing/Procedures: ?Your physician has requested that you have a carotid duplex. This test is an ultrasound of the carotid arteries in your neck. It looks at blood flow through these arteries that supply the brain with blood. Allow one hour for this exam. There are no restrictions or special instructions. ? ?Your physician has requested that you have a renal artery duplex. During this test, an ultrasound is used to evaluate blood flow to the kidneys. Allow one hour for this exam. Do not eat after midnight the day before and avoid carbonated beverages. Take your medications as you usually do. ?To be done in September. These procedures will be done at [facility] ? ? ?Follow-Up: ?At [facility], you and your health needs are our priority.  As part of our continuing mission to provide you with exceptional heart care, we have created designated Provider Care Teams.  These Care Teams include your primary Cardiologist (physician) and Advanced Practice Providers (APPs -  Physician Assistants and Nurse Practitioners) who all work together to provide you with the care you need, when you need it. ? ?We recommend signing up for the patient portal called "MyChart".  Sign up information is provided on this After Visit Summary.  MyChart is used to connect with patients  for Virtual Visits (Telemedicine).  Patients are able to view lab/test results, encounter notes, upcoming appointments, etc.  Non-urgent messages can be sent to your provider as well.   ?To learn more about what you can do with MyChart, go to [email or web address].   ? ?Your next appointment:   ?12 month(s) ? ?The format for your next appointment:   ?In Person ? ?Provider:   ?Quay Burow, MD  ?

## 2021-09-04 NOTE — Assessment & Plan Note (Signed)
History of chronic A. fib rate controlled on Eliquis oral anticoagulation 

## 2021-09-04 NOTE — Assessment & Plan Note (Signed)
History of essential hypertension a blood pressure measured today 120/60.  He is on Bystolic, olmesartan, amlodipine and hydrochlorothiazide. ?

## 2021-09-04 NOTE — Assessment & Plan Note (Signed)
History of carotid artery disease status post elective right carotid endarterectomy performed by Dr. [name] 09/26/2015 at my request.  His most recent carotid Dopplers performed 02/15/2018 revealed a widely patent endarterectomy site.  We will repeat his carotid Dopplers in September. ?

## 2021-09-04 NOTE — Progress Notes (Signed)
? ? ? ?09/04/2021 ?Hurman Horn   ?1945/11/28  ?[id number] ? ?Primary Physician Sharilyn Sites, MD ?Primary Cardiologist: [name] MD [name], [name] ? ?HPI:  Steven Scott is a 76 y.o.  moderately overweight, married Caucasian male, father of 2 who I last saw in the office 07/18/2020.  He has a history of persistent hypertension status post bilateral renal artery PTA and stenting with a positive Myoview, as well as heart catheterization that showed a high-grade distal RCA stenosis. I stented both his renal arteries successfully but was unable to cross his distal RCA, which Dr. Claiborne Billings subsequently performed successfully on March 28, 2008, with a Xience V drug-eluting stent with an excellent result. He clinically improved. He had an excellent lipid profile. Renal Dopplers performed January 31, 2011, showed progression of disease in both renal arteries suggesting high "in-stent restenosis." I performed angiography on him October 18 revealing 90% right and 70% left in-stent restenosis with 60 and 40-mm gradients, respectively. He underwent AngioSculpt cutting balloon atherectomy of both renal arteries with an excellent angiographic and followup Doppler result. His blood pressure has remained stable. He is otherwise asymptomatic. He was changed to generic antihypertensive medications off of Tribenzor and apparently his blood pressures have been more difficult to control since that time. He does admit to dietary indiscretion with regards to salt as well.  ?I saw Mr. Wholey 04/19/12. He denies chest pain or shortness of breath. Apparently his creatinine has slowly increased now to close to 2. Recent renal Dopplers performed in our office 11/19/12 suggest rapid progression of the in-stent restenosis and throat within the left renal artery stent with a renal/aortic ratio of 7.33. Based on this, I re-angiogram him on 12/14/12 revealing an 80-90% mid in-stent restenosis" within the left renal artery stent  which I restented with a ICast  covered stent. His right renal artery had 60-70% ostial stenosis at that time. I performed cutting balloon angioplasty on the right renal artery in-stent restenosis.Followup Dopplers performed last month revealed normalization of his left renal artery to aortic ratio with mild progression of disease on the right. Since I saw him in the office 6 months ago he's done well .  Renal Dopplers that suggest patent renal stents although his renal dimensions have mildly decreased. Because of high-grade right ICA stenosis I referred him to Dr. [name] performed elective endarterectomy on 09/26/15. He had minimal disease on the left. A Myoview stress test on prior to that was nonischemic .  ?  ? Unfortunately, his wife of 110 years died 2019-02-16.  She had Alzheimer's for many years and he was taking care of her.  He does have 2 children that live in [place] but no grandchildren yet.  He did have renal Doppler studies performed 12/29/2018 that shows some progression of disease on the left with known occluded right renal artery.  His renal aortic ratio increased from 5.1-6.4.  Renal dimensions remained stable at 12.2 cm.  His blood pressure has been under good control. ?  ?He had a neurosurgical procedure by Dr. Arnoldo Morale for herniated disc.  He currently walks with a walker because of chronic back pain and instability. ? ?Since I saw him a year ago he remained stable.  He denies chest pain or shortness of breath.  His most recent renal Dopplers performed 01/10/2021 revealed a patent left renal artery stent with an RAR of 4.1 and a left renal dimension of 12.4 cm. ? ?Current Meds  ?Medication Sig  ? apixaban (  ELIQUIS) 5 MG TABS tablet TAKE (1) TABLET BY MOUTH TWICE DAILY.  ? aspirin EC 81 MG tablet Take 1 tablet (81 mg total) by mouth daily. Start on 11/15/14 (Patient taking differently: Take 81 mg by mouth every morning. Start on 11/15/14)  ? clobetasol cream (TEMOVATE) 6.50 % Apply 1 application  topically daily as needed (Psoriasis).  ? diphenhydrAMINE (BENADRYL) 25 MG tablet Take 25 mg by mouth at bedtime.  ? docusate sodium (COLACE) 100 MG capsule Take 1 capsule (100 mg total) by mouth 2 (two) times daily.  ? gabapentin (NEURONTIN) 300 MG capsule TAKE 1 CAPSULE BY MOUTHOTHREE TIMES A DAY.  ? HYDROcodone-acetaminophen (NORCO/VICODIN) 5-325 MG tablet Take 1 tablet by mouth every 6 (six) hours as needed.  ? nebivolol (BYSTOLIC) 5 MG tablet Take 1 tablet (5 mg total) by mouth every morning.  ? Olmesartan-amLODIPine-HCTZ 40-10-25 MG TABS TAKE 1 TABLET BY MOUTH DAILY WITH BREAKFAST.  ? pantoprazole (PROTONIX) 40 MG tablet Take 1 tablet (40 mg total) by mouth daily before breakfast.  ? [DISCONTINUED] simvastatin (ZOCOR) 20 MG tablet TAKE (1) TABLET BY MOUTH AT BEDTIME.  ?  ? ?Allergies  ?Allergen Reactions  ? Penicillins Rash  ? ? ?Social History  ? ?Socioeconomic History  ? Marital status: Widowed  ?  Spouse name: Not on file  ? Number of children: 22  ? Years of education: Not on file  ? Highest education level: Not on file  ?Occupational History  ? Occupation: retired  ?Tobacco Use  ? Smoking status: Former  ?  Packs/day: 2.00  ?  Years: 50.00  ?  Pack years: 100.00  ?  Types: Cigarettes  ?  Quit date: 02/24/2011  ?  Years since quitting: 10.5  ? Smokeless tobacco: Never  ?Vaping Use  ? Vaping Use: Never used  ?Substance and Sexual Activity  ? Alcohol use: No  ?  Alcohol/week: 0.0 standard drinks  ?  Comment: 12/13/2012 "last alcohol was ~ 4 yr ago"  ? Drug use: No  ? Sexual activity: Not Currently  ?Other Topics Concern  ? Not on file  ?Social History Narrative  ? Not on file  ? ?Social Determinants of Health  ? ?Financial Resource Strain: Not on file  ?Food Insecurity: Not on file  ?Transportation Needs: Not on file  ?Physical Activity: Not on file  ?Stress: Not on file  ?Social Connections: Not on file  ?Intimate Partner Violence: Not on file  ?  ? ?Review of Systems: ?General: negative for chills, fever,  night sweats or weight changes.  ?Cardiovascular: negative for chest pain, dyspnea on exertion, edema, orthopnea, palpitations, paroxysmal nocturnal dyspnea or shortness of breath ?Dermatological: negative for rash ?Respiratory: negative for cough or wheezing ?Urologic: negative for hematuria ?Abdominal: negative for nausea, vomiting, diarrhea, bright red blood per rectum, melena, or hematemesis ?Neurologic: negative for visual changes, syncope, or dizziness ?All other systems reviewed and are otherwise negative except as noted above. ? ? ? ?Blood pressure 120/60, pulse 75, height '5\' 11"'$  (1.803 m), weight 258 lb (117 kg).  ?General appearance: alert and no distress ?Neck: no adenopathy, no carotid bruit, no JVD, supple, symmetrical, trachea midline, and thyroid not enlarged, symmetric, no tenderness/mass/nodules ?Lungs: clear to auscultation bilaterally ?Heart: irregularly irregular rhythm ?Extremities: extremities normal, atraumatic, no cyanosis or edema ?Pulses: 2+ and symmetric ?Skin: Skin color, texture, turgor normal. No rashes or lesions ?Neurologic: Grossly normal ? ?EKG atrial fibrillation with a ventricular sponsor 75, septal Q waves.  I personally reviewed this EKG. ? ?  ASSESSMENT AND PLAN:  ? ?Hyperlipidemia ?History of hyperlipidemia on simvastatin 20 mg a day with recent lipid profile performed 05/16/2021 revealing a total cholesterol of 165, LDL 104 and HDL 39.  He is not at goal for secondary prevention.  I am going to change his simvastatin to atorvastatin 40 mg a day and we will recheck a lipid liver profile in 3 months. ? ?CAD -S/P RCA DES 2010 ?History of CAD status post distal RCA intervention by Dr. Claiborne Billings 03/28/2008 with a Xience drug-eluting stent.  He said no recurrent symptoms.  He did have a Myoview prior to his carotid endarterectomy for preoperative clearance May 2017 that was nonischemic. ? ?Essential hypertension ?History of essential hypertension a blood pressure measured today 120/60.  He  is on Bystolic, olmesartan, amlodipine and hydrochlorothiazide. ? ?Renal artery stenosis ([place]) ?History of bilateral renal artery stenting with reintervention 12/14/2012 using cutting balloon on the right and iC

## 2021-09-09 DIAGNOSIS — I10 Essential (primary) hypertension: Secondary | ICD-10-CM | POA: Diagnosis not present

## 2021-09-09 DIAGNOSIS — I2510 Atherosclerotic heart disease of native coronary artery without angina pectoris: Secondary | ICD-10-CM | POA: Diagnosis not present

## 2021-09-09 DIAGNOSIS — E782 Mixed hyperlipidemia: Secondary | ICD-10-CM | POA: Diagnosis not present

## 2021-09-09 DIAGNOSIS — I119 Hypertensive heart disease without heart failure: Secondary | ICD-10-CM | POA: Diagnosis not present

## 2021-09-10 DIAGNOSIS — I2510 Atherosclerotic heart disease of native coronary artery without angina pectoris: Secondary | ICD-10-CM | POA: Diagnosis not present

## 2021-09-10 DIAGNOSIS — R531 Weakness: Secondary | ICD-10-CM | POA: Diagnosis not present

## 2021-09-19 ENCOUNTER — Ambulatory Visit (HOSPITAL_COMMUNITY)
Admission: RE | Admit: 2021-09-19 | Discharge: 2021-09-19 | Disposition: A | Discharge status: Home / Self Care | Payer: Medicare Other | Source: Ambulatory Visit | Attending: Cardiovascular Disease | Admitting: Cardiovascular Disease

## 2021-09-19 DIAGNOSIS — I6523 Occlusion and stenosis of bilateral carotid arteries: Secondary | ICD-10-CM | POA: Diagnosis not present

## 2021-09-25 ENCOUNTER — Ambulatory Visit (HOSPITAL_COMMUNITY)
Admission: RE | Admit: 2021-09-25 | Discharge: 2021-09-25 | Disposition: A | Discharge status: Home / Self Care | Payer: Medicare Other | Source: Ambulatory Visit | Attending: Cardiology | Admitting: Cardiology

## 2021-09-25 DIAGNOSIS — I701 Atherosclerosis of renal artery: Secondary | ICD-10-CM | POA: Insufficient documentation

## 2021-09-26 ENCOUNTER — Other Ambulatory Visit: Payer: Self-pay

## 2021-09-26 DIAGNOSIS — I701 Atherosclerosis of renal artery: Secondary | ICD-10-CM

## 2021-09-26 DIAGNOSIS — I6523 Occlusion and stenosis of bilateral carotid arteries: Secondary | ICD-10-CM

## 2021-10-01 ENCOUNTER — Ambulatory Visit: Payer: Medicare Other | Admitting: Cardiovascular Disease

## 2021-10-02 ENCOUNTER — Other Ambulatory Visit: Payer: Self-pay | Admitting: Cardiovascular Disease

## 2021-10-02 DIAGNOSIS — N1831 Chronic kidney disease, stage 3a: Secondary | ICD-10-CM | POA: Diagnosis not present

## 2021-10-02 DIAGNOSIS — I129 Hypertensive chronic kidney disease with stage 1 through stage 4 chronic kidney disease, or unspecified chronic kidney disease: Secondary | ICD-10-CM | POA: Diagnosis not present

## 2021-10-02 DIAGNOSIS — R799 Abnormal finding of blood chemistry, unspecified: Secondary | ICD-10-CM | POA: Diagnosis not present

## 2021-10-09 DIAGNOSIS — E211 Secondary hyperparathyroidism, not elsewhere classified: Secondary | ICD-10-CM | POA: Diagnosis not present

## 2021-10-09 DIAGNOSIS — I701 Atherosclerosis of renal artery: Secondary | ICD-10-CM | POA: Diagnosis not present

## 2021-10-09 DIAGNOSIS — I129 Hypertensive chronic kidney disease with stage 1 through stage 4 chronic kidney disease, or unspecified chronic kidney disease: Secondary | ICD-10-CM | POA: Diagnosis not present

## 2021-10-09 DIAGNOSIS — R809 Proteinuria, unspecified: Secondary | ICD-10-CM | POA: Diagnosis not present

## 2021-11-22 DIAGNOSIS — E782 Mixed hyperlipidemia: Secondary | ICD-10-CM | POA: Diagnosis not present

## 2021-11-22 LAB — HEPATIC FUNCTION PANEL
ALT: 11 IU/L (ref 0–44)
AST: 9 IU/L (ref 0–40)
Albumin: 4.1 g/dL (ref 3.8–4.8)
Alkaline Phosphatase: 99 IU/L (ref 44–121)
Bilirubin Total: 0.5 mg/dL (ref 0.0–1.2)
Bilirubin, Direct: 0.21 mg/dL (ref 0.00–0.40)
Total Protein: 6.5 g/dL (ref 6.0–8.5)

## 2021-11-22 LAB — LIPID PANEL
Chol/HDL Ratio: 2.5 ratio (ref 0.0–5.0)
Cholesterol, Total: 94 mg/dL — ABNORMAL LOW (ref 100–199)
HDL: 38 mg/dL — ABNORMAL LOW (ref >39)
LDL Chol Calc (NIH): 42 mg/dL (ref 0–99)
Triglycerides: 65 mg/dL (ref 0–149)
VLDL Cholesterol Cal: 14 mg/dL (ref 5–40)

## 2021-12-11 DIAGNOSIS — I129 Hypertensive chronic kidney disease with stage 1 through stage 4 chronic kidney disease, or unspecified chronic kidney disease: Secondary | ICD-10-CM | POA: Diagnosis not present

## 2021-12-19 DIAGNOSIS — I129 Hypertensive chronic kidney disease with stage 1 through stage 4 chronic kidney disease, or unspecified chronic kidney disease: Secondary | ICD-10-CM | POA: Diagnosis not present

## 2021-12-19 DIAGNOSIS — R809 Proteinuria, unspecified: Secondary | ICD-10-CM | POA: Diagnosis not present

## 2021-12-19 DIAGNOSIS — E211 Secondary hyperparathyroidism, not elsewhere classified: Secondary | ICD-10-CM | POA: Diagnosis not present

## 2021-12-19 DIAGNOSIS — I701 Atherosclerosis of renal artery: Secondary | ICD-10-CM | POA: Diagnosis not present

## 2022-02-27 DIAGNOSIS — I129 Hypertensive chronic kidney disease with stage 1 through stage 4 chronic kidney disease, or unspecified chronic kidney disease: Secondary | ICD-10-CM | POA: Diagnosis not present

## 2022-03-11 DIAGNOSIS — E211 Secondary hyperparathyroidism, not elsewhere classified: Secondary | ICD-10-CM | POA: Diagnosis not present

## 2022-03-11 DIAGNOSIS — I129 Hypertensive chronic kidney disease with stage 1 through stage 4 chronic kidney disease, or unspecified chronic kidney disease: Secondary | ICD-10-CM | POA: Diagnosis not present

## 2022-03-11 DIAGNOSIS — R809 Proteinuria, unspecified: Secondary | ICD-10-CM | POA: Diagnosis not present

## 2022-05-14 ENCOUNTER — Other Ambulatory Visit: Payer: Self-pay | Admitting: Cardiovascular Disease

## 2022-05-14 DIAGNOSIS — I129 Hypertensive chronic kidney disease with stage 1 through stage 4 chronic kidney disease, or unspecified chronic kidney disease: Secondary | ICD-10-CM | POA: Diagnosis not present

## 2022-05-14 DIAGNOSIS — I482 Chronic atrial fibrillation, unspecified: Secondary | ICD-10-CM

## 2022-05-14 NOTE — Telephone Encounter (Signed)
Prescription refill request for Eliquis received. Indication: Afib  Last office visit: 09/04/21 Gwenlyn Found)  Scr: 2.01 (05/16/21)  Age: 77 Weight: 117kg  Appropriate dose. Refill sent.

## 2022-05-21 DIAGNOSIS — E211 Secondary hyperparathyroidism, not elsewhere classified: Secondary | ICD-10-CM | POA: Diagnosis not present

## 2022-05-21 DIAGNOSIS — I129 Hypertensive chronic kidney disease with stage 1 through stage 4 chronic kidney disease, or unspecified chronic kidney disease: Secondary | ICD-10-CM | POA: Diagnosis not present

## 2022-05-21 DIAGNOSIS — I701 Atherosclerosis of renal artery: Secondary | ICD-10-CM | POA: Diagnosis not present

## 2022-05-21 DIAGNOSIS — R809 Proteinuria, unspecified: Secondary | ICD-10-CM | POA: Diagnosis not present

## 2022-05-21 DIAGNOSIS — R008 Other abnormalities of heart beat: Secondary | ICD-10-CM | POA: Diagnosis not present

## 2022-05-21 NOTE — Progress Notes (Signed)
Cardiology Clinic Note   Patient Name: Steven Scott Date of Encounter: 05/23/2022  Primary Care Provider:  Sharilyn Sites, MD Primary Cardiologist:  Quay Burow, MD  Patient Profile    Steven Scott 77 year old male presents the clinic today for an evaluation of his irregular heartbeat.  Past Medical History    Past Medical History:  Diagnosis Date   Arthritis    Atrial fibrillation ([place]) 03/2015   Carotid artery occlusion    CKD (chronic kidney disease)    Coronary artery disease    GERD (gastroesophageal reflux disease)    Headache    History of bleeding ulcers    History of kidney stones    Hyperlipidemia    Hypertension    Kidney stone    Pneumonia    Renal artery stenosis (HCC)    Multiple interventions   Seasonal allergies    Shortness of breath    "at any time" (12/13/2012)   Status post percutaneous angioplasty of renal artery 10/18 2012   PV angiogram preformed revealing 90% right and 70% left in stent restenosis with 37m and 452mgradients respectively.   Past Surgical History:  Procedure Laterality Date   CARDIAC CATHETERIZATION  03/02/2009   has moderate circumflex and diagonal branch disease with high-grade distal right disease and fail attempt at PCI   COLONOSCOPY  12/18/2010   Procedure: COLONOSCOPY;  Surgeon: NaRogene HoustonMD;  [facility];  Service: Endoscopy;  Laterality: N/A;   COLONOSCOPY N/A 09/21/2019   Procedure: COLONOSCOPY;  Surgeon: ReRogene HoustonMD;  [facility];  Service: Endoscopy;  Laterality: N/A;  1210   CORONARY ANGIOPLASTY WITH STENT PLACEMENT  03/28/2009   Successful PCI od high-grade 95% eccentric tandem plague stenosis in the distal right coronary artery with ultimate insertion of a 3.0 x 18 mm Xience V DES stent postdilated at 3.46 mm with the 99% stenosis being reduced to 0%   ENDARTERECTOMY Right 09/26/2015   Procedure: RIGHT CAROTID ARTERY ENDARTERECTOMY ;  Surgeon: ChElam DutchMD;   [facility];  Service: Vascular;  Laterality: Right;   ESOPHAGOGASTRODUODENOSCOPY N/A 11/08/2014   Procedure: ESOPHAGOGASTRODUODENOSCOPY (EGD);  Surgeon: NaRogene HoustonMD;  [facility];  Service: Endoscopy;  Laterality: N/A;   ESOPHAGOGASTRODUODENOSCOPY N/A 03/15/2015   Procedure: ESOPHAGOGASTRODUODENOSCOPY (EGD);  Surgeon: NaRogene HoustonMD;  [facility];  Service: Endoscopy;  Laterality: N/A;  1255   LITHOTRIPSY     "laser" (12/13/2012)   LUMBAR LAMINECTOMY/DECOMPRESSION MICRODISCECTOMY Bilateral 02/15/2020   Procedure: BILATERAL LUMBAR THREE- LUMBAR FOUR LAMINOTOMY, FORAMINOTOMY, LEFT LUMBAR THREE- LUMBAR FOUR DISKECTOMY;  Surgeon: JeNewman PiesMD;  [facility];  Laterality: Bilateral;  3C   PATCH ANGIOPLASTY Right 09/26/2015   Procedure: WITH HEMASHIELD PATCH ANGIOPLASTY;  Surgeon: ChElam DutchMD;  [place] [facility];  Laterality: Right;   PERCUTANEOUS STENT INTERVENTION Left 12/14/2012   Procedure: PERCUTANEOUS STENT INTERVENTION;  Surgeon: JoLorretta HarpMD;  [facility];  Service: Cardiovascular;  Laterality: Left;  left renal artery   PERCUTANEOUS STENT INTERVENTION  02/16/2014   Procedure: PERCUTANEOUS STENT INTERVENTION;  Surgeon: JoLorretta HarpMD;  [facility];  Service: Cardiovascular;;  left renal   POLYPECTOMY  09/21/2019   Procedure: POLYPECTOMY;  Surgeon: ReRogene HoustonMD;  [facility];  Service: Endoscopy;;   RENAL ANGIOGRAM N/A 12/14/2012   Procedure: RENAL ANGIOGRAM;  Surgeon: JoPearletha Forge  Gwenlyn Found, MD;  Location: [facility] CATH LAB;  Service: Cardiovascular;  Laterality: N/A;   RENAL ANGIOGRAM N/A 02/16/2014   Procedure: RENAL ANGIOGRAM;  Surgeon: [name], MD;  Location: [facility] CATH LAB;  Service: Cardiovascular;  Laterality: N/A;   RENAL ARTERY ANGIOPLASTY Bilateral 02/2014   PV restenting L RA with an ICast covered stent and cutting balloon atherectomy R RA    RENAL ARTERY STENT Bilateral 2010    Allergies  Allergies  Allergen Reactions   Penicillins Rash    History of Present Illness    Steven Scott has a PMH of coronary artery disease status post cardiac catheterization with DES to his RCA in 2010, HTN, renal artery stenosis status post stenting, chronic atrial fibrillation, bilateral carotid artery disease, CKD stage III, hyperlipidemia, obesity, constipation, and anemia.  He underwent renal Dopplers 11/19/2012 which showed rapid progression of in-stent stenosis with a ratio of 7.33 renal/aortic.  On 12/14/2012 he underwent reangiogram which showed 80-90% mid in-stent restenosis.  He was restented.  He was noted to have right renal artery 60-70% ostial stenosis at the time.  He received Cutting Balloon angioplasty of the right renal artery.  Follow-up Doppler showed normalization of his left renal artery to aortic ratio with mild progression of disease on the right.  He was referred to Dr. [name] for elective endarterectomy 5/17.  He was noted to have minimal disease on the left.  He underwent stress testing prior to intervention which showed no ischemia.  His wife passed away at age 78 on 02/22/2023.  She had been diagnosed with Alzheimer's for many years and he was her primary caregiver.  They have 2 children that live in [place].  He underwent renal Doppler 12/29/2018 which showed some progression in disease on the left with known occluded right renal artery.  His blood pressure was well-controlled.  He followed up with Dr.Berry 09/04/2021.  During that time he remained stable from a cardiac standpoint.  He denies shortness of breath and chest discomfort.  His renal Dopplers 01/10/2021 showed patent left renal artery stent with a RAR of 4.1.  He presents the clinic today for evaluation of his irregular heart rate.  He states he was asked to present to cardiology by his nephrologist for follow-up of his irregular heartbeat.  His EKG today shows no  atrial fibrillation with premature aberrantly conducted complexes 88 bpm.  He reports that towards the end of the year he ran out of his medications for 4 to 5 days and was in the donut hole since 7/23.  He has now refilled his medication and is compliant.  He had recent blood work drawn with his nephrologist.  We reviewed his blood work.  We reviewed his previous visit with Dr. Gwenlyn Found.  He expressed understanding.  Dr.Berry follows his renal artery stenosis with renal artery duplex.  I will reorder.  Will plan follow-up in 12 months.  We will also plan for repeat lipids and LFTs 7/24.  Today he denies chest pain, shortness of breath, lower extremity edema, fatigue, palpitations, melena, hematuria, hemoptysis, diaphoresis, weakness, presyncope, syncope, orthopnea, and PND.     Home Medications    Prior to Admission medications   Medication Sig Start Date End Date Taking? Authorizing Provider  apixaban (ELIQUIS) 5 MG TABS tablet TAKE (1) TABLET BY MOUTH TWICE DAILY. 05/14/22   [name], MD  aspirin EC 81 MG tablet Take 1 tablet (81 mg total) by mouth daily. Start on 11/15/14 Patient taking differently: Take  81 mg by mouth every morning. Start on 11/15/14 09/23/19   Rogene Houston, MD  atorvastatin (LIPITOR) 40 MG tablet Take 1 tablet (40 mg total) by mouth daily. 09/04/21   [name], MD  clobetasol cream (TEMOVATE) 1.60 % Apply 1 application topically daily as needed (Psoriasis).    [provider]  diphenhydrAMINE (BENADRYL) 25 MG tablet Take 25 mg by mouth at bedtime.    [provider]  docusate sodium (COLACE) 100 MG capsule Take 1 capsule (100 mg total) by mouth 2 (two) times daily. 02/16/20   Newman Pies, MD  gabapentin (NEURONTIN) 300 MG capsule TAKE 1 CAPSULE BY MOUTHOTHREE TIMES A DAY. 07/03/20   [provider]  HYDROcodone-acetaminophen (NORCO/VICODIN) 5-325 MG tablet Take 1 tablet by mouth every 6 (six) hours as needed. 07/03/20   [provider]  nebivolol (BYSTOLIC) 5 MG tablet Take 1 tablet (5 mg total) by mouth every morning. 05/17/20   [name], MD  Olmesartan-amLODIPine-HCTZ 40-10-25 MG TABS TAKE 1 TABLET BY MOUTH DAILY WITH BREAKFAST. 10/02/21   [name], MD  pantoprazole (PROTONIX) 40 MG tablet Take 1 tablet (40 mg total) by mouth daily before breakfast. 09/21/19   Rehman, Mechele Dawley, MD    Family History    Family History  Problem Relation Age of Onset   COPD Father 32       deceased   Kidney failure Father    He indicated that his mother is deceased. He indicated that his father is deceased. He indicated that his daughter is alive. He indicated that his other is alive.  Social History    Social History   Socioeconomic History   Marital status: Widowed    Spouse name: Not on file   Number of children: 37   Years of education: Not on file   Highest education level: Not on file  Occupational History   Occupation: retired  Tobacco Use   Smoking status: Former    Packs/day: 2.00    Years: 50.00    Total pack years: 100.00    Types: Cigarettes    Quit date: 02/24/2011    Years since quitting: 11.2   Smokeless tobacco: Never  Vaping Use   Vaping Use: Never used  Substance and Sexual Activity   Alcohol use: No    Alcohol/week: 0.0 standard drinks of alcohol    Comment: 12/13/2012 "last alcohol was ~ 4 yr ago"   Drug use: No   Sexual activity: Not Currently  Other Topics Concern   Not on file  Social History Narrative   Not on file   Social Determinants of Health   Financial Resource Strain: Not on file  Food Insecurity: Not on file  Transportation Needs: Not on file  Physical Activity: Not on file  Stress: Not on file  Social Connections: Not on file  Intimate Partner Violence: Not on file     Review of Systems    General:  No chills, fever, night sweats or weight changes.  Cardiovascular:  No chest pain, dyspnea on exertion, edema, orthopnea, palpitations, paroxysmal  nocturnal dyspnea. Dermatological: No rash, lesions/masses Respiratory: No cough, dyspnea Urologic: No hematuria, dysuria Abdominal:   No nausea, vomiting, diarrhea, bright red blood per rectum, melena, or hematemesis Neurologic:  No visual changes, wkns, changes in mental status. All other systems reviewed and are otherwise negative except as noted above.  Physical Exam    VS:  BP 138/60   Pulse 88   Ht  $'5\' 11"'a$  (1.803 m)   Wt 248 lb (112.5 kg)   SpO2 96%   BMI 34.59 kg/m  , BMI Body mass index is 34.59 kg/m. GEN: Well nourished, well developed, in no acute distress. HEENT: normal. Neck: Supple, no JVD, carotid bruits, or masses. Cardiac: RRR, no murmurs, rubs, or gallops. No clubbing, cyanosis, edema.  Radials/DP/PT 2+ and equal bilaterally.  Respiratory:  Respirations regular and unlabored, clear to auscultation bilaterally. GI: Soft, nontender, nondistended, BS + x 4. MS: no deformity or atrophy. Skin: warm and dry, no rash. Neuro:  Strength and sensation are intact. Psych: Normal affect.  Accessory Clinical Findings    Recent Labs: 11/22/2021: ALT 11   Recent Lipid Panel    Component Value Date/Time   CHOL 94 (L) 11/22/2021 1103   TRIG 65 11/22/2021 1103   HDL 38 (L) 11/22/2021 1103   CHOLHDL 2.5 11/22/2021 1103   CHOLHDL 2.6 12/05/2014 0929   VLDL 12 12/05/2014 0929   LDLCALC 42 11/22/2021 1103         ECG personally reviewed by me today-atrial fibrillation with premature aberrantly conducted complexes atrial septal infarct undetermined age 42 bpm- No acute changes  Echocardiogram 09/20/2015 LV EF: 60% -   65%   -------------------------------------------------------------------  History:  PMH:  Acquired from the patient and from the patient&'s  chart. Dizziness.  Atrial fibrillation.  Coronary artery disease.  Chronic renal failure.  Risk factors:  Former tobacco use.  Hypertension. Obese. Dyslipidemia.    -------------------------------------------------------------------  Study Conclusions   - Procedure narrative: Transthoracic echocardiography. Image    quality was fair. The study was technically difficult, as a    result of body habitus. The cardiac rhythm appears to be afib or    a-flutter.  - Left ventricle: The cavity size was normal. Wall thickness was    increased in a pattern of mild LVH. Systolic function was normal.    The estimated ejection fraction was in the range of 60% to 65%.    The study is not technically sufficient to allow evaluation of LV    diastolic function.  - Mitral valve: Mildly thickened leaflets . There was trivial    regurgitation.  - Left atrium: The atrium was normal in size.  - Atrial septum: No defect or patent foramen ovale was identified.  - Tricuspid valve: There was trivial regurgitation.  - Pulmonary arteries: PA peak pressure: 33 mm Hg (S).  - Inferior vena cava: The vessel was normal in size. The    respirophasic diameter changes were in the normal range (>= 50%),    consistent with normal central venous pressure.   Impressions:   - LVEF 60-65%, mild LVH, trivial MR, normal LA size, trivial TR,    RVSP 33 mmHg, normal IVC. A-fib or a-flutter was noted in the    exam.    Nuclear stress test 09/20/2015  There was no ST segment deviation noted during stress. The study is normal.   Normal stress nuclear study with no ischemia or infarction; study not gated due to atrial fibrillation.  Assessment & Plan   1.  Atrial fib-EKG today shows atrial fibrillation with prematurely aberrantly conducted complexes atrial septal infarct undetermined age 30 bpm. Continue Bystolic, apixaban Heart healthy low-sodium diet-salty 6 given Increase physical activity as tolerated Avoid triggers caffeine, chocolate, EtOH, dehydration etc.   Essential hypertension-BP today 138/60.  History of renal artery stenosis which is monitored by Dr. Gwenlyn Found. Continue  Bystolic, amlodipine, olmesartan Heart healthy low-sodium diet-salty 6  given Increase physical activity as tolerated   Coronary artery disease-no chest pain today.  Denies recent episodes of chest tightness and pressure.  Underwent cardiac catheterization with PCI and DES of his RCA in 2010.  Underwent stress testing 09/20/2015 which showed no ischemia. Continue current medical therapy  Hyperlipidemia-LDL 42 on 11/22/2021. Continue current medical therapy Repeat fasting lipids and LFTs 7/24.  Renal artery stenosis-history of renal artery stenosis with intervention 8/14.  Renal artery duplex 9/22 showed patent left renal artery stent with renal aortic ratio 4.1 and kidney dimension of 12.44. Repeat Renal artery duplex  Disposition: Follow-up with Dr.Berry or me in  12 months.   Jossie Ng. Arriyah Madej NP-C     05/23/2022, 4:23 PM [place] [address] Office [phone] Fax [phone]    I spent 14 minutes examining this patient, reviewing medications, and using patient centered shared decision making involving her cardiac care.  Prior to her visit I spent greater than 20 minutes reviewing her past medical history,  medications, and prior cardiac tests.

## 2022-05-23 ENCOUNTER — Ambulatory Visit: Payer: Medicare Other | Attending: General Practice | Admitting: General Practice

## 2022-05-23 ENCOUNTER — Encounter: Payer: Self-pay | Admitting: General Practice

## 2022-05-23 VITALS — BP 138/60 | HR 88 | Ht 71.0 in | Wt 248.0 lb

## 2022-05-23 DIAGNOSIS — I482 Chronic atrial fibrillation, unspecified: Secondary | ICD-10-CM

## 2022-05-23 DIAGNOSIS — I701 Atherosclerosis of renal artery: Secondary | ICD-10-CM | POA: Diagnosis not present

## 2022-05-23 DIAGNOSIS — Z9861 Coronary angioplasty status: Secondary | ICD-10-CM

## 2022-05-23 DIAGNOSIS — I10 Essential (primary) hypertension: Secondary | ICD-10-CM

## 2022-05-23 DIAGNOSIS — I2510 Atherosclerotic heart disease of native coronary artery without angina pectoris: Secondary | ICD-10-CM | POA: Diagnosis not present

## 2022-05-23 DIAGNOSIS — E785 Hyperlipidemia, unspecified: Secondary | ICD-10-CM | POA: Diagnosis not present

## 2022-05-23 NOTE — Patient Instructions (Addendum)
Medication Instructions:  Your physician recommends that you continue on your current medications as directed. Please refer to the Current Medication list given to you today.  *If you need a refill on your cardiac medications before your next appointment, please call your pharmacy*  Lab Work: Your physician recommends that you return for lab work on 12/04/22:  Fasting Lipid Panel-DO NOT eat or drink past midnight.  Hepatic (Liver) Function Test   If you have labs (blood work) drawn today and your tests are completely normal, you will receive your results only by: MyChart Message (if you have MyChart) OR A paper copy in the mail If you have any lab test that is abnormal or we need to change your treatment, we will call you to review the results.  Testing/Procedures: NONE ordered at this time of appointment   Follow-Up: At [facility], you and your health needs are our priority.  As part of our continuing mission to provide you with exceptional heart care, we have created designated Provider Care Teams.  These Care Teams include your primary Cardiologist (physician) and Advanced Practice Providers (APPs -  Physician Assistants and Nurse Practitioners) who all work together to provide you with the care you need, when you need it.  Your next appointment:   12 month(s)  Provider:   Quay Burow, MD     Other Instructions

## 2022-06-24 ENCOUNTER — Ambulatory Visit: Payer: Medicare Other | Admitting: Urology

## 2022-07-25 DIAGNOSIS — E211 Secondary hyperparathyroidism, not elsewhere classified: Secondary | ICD-10-CM | POA: Diagnosis not present

## 2022-07-25 DIAGNOSIS — I701 Atherosclerosis of renal artery: Secondary | ICD-10-CM | POA: Diagnosis not present

## 2022-07-25 DIAGNOSIS — R809 Proteinuria, unspecified: Secondary | ICD-10-CM | POA: Diagnosis not present

## 2022-07-25 DIAGNOSIS — I129 Hypertensive chronic kidney disease with stage 1 through stage 4 chronic kidney disease, or unspecified chronic kidney disease: Secondary | ICD-10-CM | POA: Diagnosis not present

## 2022-08-12 DIAGNOSIS — E211 Secondary hyperparathyroidism, not elsewhere classified: Secondary | ICD-10-CM | POA: Diagnosis not present

## 2022-08-12 DIAGNOSIS — I129 Hypertensive chronic kidney disease with stage 1 through stage 4 chronic kidney disease, or unspecified chronic kidney disease: Secondary | ICD-10-CM | POA: Diagnosis not present

## 2022-08-12 DIAGNOSIS — E870 Hyperosmolality and hypernatremia: Secondary | ICD-10-CM | POA: Diagnosis not present

## 2022-08-12 DIAGNOSIS — R809 Proteinuria, unspecified: Secondary | ICD-10-CM | POA: Diagnosis not present

## 2022-08-12 DIAGNOSIS — I701 Atherosclerosis of renal artery: Secondary | ICD-10-CM | POA: Diagnosis not present

## 2022-08-19 ENCOUNTER — Telehealth: Payer: Self-pay | Admitting: Cardiovascular Disease

## 2022-08-19 NOTE — Telephone Encounter (Signed)
Pt would like a callback regarding Carotid test that he's scheduled for. Pt states he knew nothing of this particular test. Please advise

## 2022-08-19 NOTE — Telephone Encounter (Signed)
Called pt to discuss carotid dopplers. Per Dr. Allyson Sabal, pt to have renal and carotid dopplers on annual basis. It looks like vascular scheduler just got these scheduled for pt at [facility] office. Pt also inquires about blood work. Pt was told at office visit with Edd Fabian, NP in January to have fasting lipid and liver panel done in July. Explained that this is fasting blood work and pt can use any [facility] to have these drawn. Pt verbalizes understanding. He does request that I send a mychart message with this information as well.

## 2022-09-09 ENCOUNTER — Other Ambulatory Visit: Payer: Self-pay | Admitting: Cardiovascular Disease

## 2022-09-09 DIAGNOSIS — I701 Atherosclerosis of renal artery: Secondary | ICD-10-CM

## 2022-09-09 DIAGNOSIS — I6523 Occlusion and stenosis of bilateral carotid arteries: Secondary | ICD-10-CM

## 2022-09-15 ENCOUNTER — Other Ambulatory Visit: Payer: Self-pay | Admitting: Cardiovascular Disease

## 2022-09-15 DIAGNOSIS — I482 Chronic atrial fibrillation, unspecified: Secondary | ICD-10-CM

## 2022-09-16 ENCOUNTER — Other Ambulatory Visit: Payer: Self-pay

## 2022-09-16 ENCOUNTER — Telehealth: Payer: Self-pay | Admitting: Cardiovascular Disease

## 2022-09-16 DIAGNOSIS — I482 Chronic atrial fibrillation, unspecified: Secondary | ICD-10-CM

## 2022-09-16 DIAGNOSIS — Z5181 Encounter for therapeutic drug level monitoring: Secondary | ICD-10-CM

## 2022-09-16 MED ORDER — APIXABAN 5 MG PO TABS: ORAL_TABLET | ORAL | 0 refills | Status: DC

## 2022-09-16 NOTE — Telephone Encounter (Signed)
Patient is requesting call back in regards to results. Requesting call back.

## 2022-09-16 NOTE — Telephone Encounter (Signed)
Spoke with pt; told that a refill was sent to his pharmacy so he should be able to pick up his medication. Told him that he will have his labs drawn on 09/29/22 when he goes for his appointment in [place]. Told him that we would notify him if anything changes- he does not need a lab slip at this time- if he does, we will let him know.  He verbalized understanding.

## 2022-09-16 NOTE — Telephone Encounter (Signed)
Prescription refill request for Eliquis received. Indication: AF Last office visit: 05/23/22  Sherlean Foot NP Scr: 2.01 on 05/16/21 Age: 77 Weight: 112.5kg  Pt scheduled to get lab work at 09/29/22.  Based on above findings Eliquis 5mg  twice daily is the appropriate dose.  Refill approved x 1 per pt request till he last labs done.  LM on voice mail for patient.

## 2022-09-17 NOTE — Telephone Encounter (Signed)
Lab orders put in for CBC/BMP to be done at [facility].  Thanks

## 2022-09-17 NOTE — Telephone Encounter (Signed)
He can go to the lab in side [facility] or the outpt lab right across the street from the ED .  [facility] lab on the 2nd floor.  Thanks for all of your help.      Left the information above over voicemail. He does not need a lab slip, the order is in the system with [facility].

## 2022-09-17 NOTE — Telephone Encounter (Signed)
He can go to the lab in side [facility] or the outpt lab right across the street from the ED .  [facility] lab on the 2nd floor.  Thanks for all of your help.

## 2022-09-25 ENCOUNTER — Telehealth: Payer: Self-pay | Admitting: Cardiovascular Disease

## 2022-09-25 NOTE — Telephone Encounter (Signed)
When he lays on his back;- he gets dizzy, nauseated, and sometimes has vomited.   When he sleeps at night he has to lay on his side  He wants to know if he has to lay flat on his back for these tests?  He reports that when he had to get an MRI years ago- Dr Lin Givens prescribed him something to help and it worked very well. A pill that he took the night before and the day of the MRI. He does not remember what the name of the medication was, but was wondering if he could get something like that to help him for 5/20?  Says he uses [place] for prescriptions   Told him that I would send this info to his provider and will get back to him.

## 2022-09-25 NOTE — Telephone Encounter (Signed)
Patient called and stated that he needs something to take for nausea and dizziness. Says that he cannot lay on his back for that reason. Patient is having a Renal and Carotid done on 5/20

## 2022-09-26 NOTE — Telephone Encounter (Signed)
Malena Peer D, RPH-CPP  Scheryl Marten, RN; Cv Div Pharmd; Runell Gess, MD4 hours ago (12:12 PM)    I dont know anything about these tests if he has to lie flat, but he could take meclizine OTC 25mg  30 min -1 hr prior to his test.    Runell Gess, MD  Malena Peer D, RPH-CPP1 hour ago (3:09 PM)    That's fine with me   Spoke with pt regarding Melissa's recommendations. Pt will pick this up tomorrow when he has to go to the pharmacy. Pt did request that I send a mychart message with name of medication.  Mychart message sent.  Pt verbalizes understanding.

## 2022-09-29 ENCOUNTER — Ambulatory Visit (INDEPENDENT_AMBULATORY_CARE_PROVIDER_SITE_OTHER): Payer: Medicare Other

## 2022-09-29 ENCOUNTER — Ambulatory Visit: Payer: Medicare Other | Attending: Cardiovascular Disease

## 2022-09-29 DIAGNOSIS — I701 Atherosclerosis of renal artery: Secondary | ICD-10-CM | POA: Diagnosis not present

## 2022-09-29 DIAGNOSIS — I6523 Occlusion and stenosis of bilateral carotid arteries: Secondary | ICD-10-CM

## 2022-10-09 DIAGNOSIS — D631 Anemia in chronic kidney disease: Secondary | ICD-10-CM | POA: Diagnosis not present

## 2022-10-09 DIAGNOSIS — R809 Proteinuria, unspecified: Secondary | ICD-10-CM | POA: Diagnosis not present

## 2022-10-09 DIAGNOSIS — Z79899 Other long term (current) drug therapy: Secondary | ICD-10-CM | POA: Diagnosis not present

## 2022-10-09 DIAGNOSIS — N1832 Chronic kidney disease, stage 3b: Secondary | ICD-10-CM | POA: Diagnosis not present

## 2022-10-16 ENCOUNTER — Telehealth: Payer: Self-pay | Admitting: Cardiovascular Disease

## 2022-10-16 DIAGNOSIS — I482 Chronic atrial fibrillation, unspecified: Secondary | ICD-10-CM

## 2022-10-16 MED ORDER — APIXABAN 5 MG PO TABS
ORAL_TABLET | ORAL | 5 refills | Status: DC
Start: 2022-10-16 — End: 2023-06-08

## 2022-10-16 NOTE — Telephone Encounter (Signed)
In care everywhere:  Patient labs done on July 25, 2022 were reviewed Patient hemoglobin was at 15.6 Sodium 146-high, potassium 4.2, chloride 109, bicarb 26  Patient creatinine was 2.1 GFR comes in to be 31 mm/min  Calcium 9.6, phosphorus 3.6, albumin 3.5

## 2022-10-16 NOTE — Telephone Encounter (Signed)
Patient stated he had labs done last week across from [facility]. I do not see any labs in his chart or care everywhere. He could not tell me the name of lab or the number so that I can call to request labs. He stated Dr. Hazle Coca told him that is where he can go. He will need a refill on his Eliquis before next Wednesday. He stated he needed labs before his next refill. Do you know the lab he is talking about?

## 2022-10-16 NOTE — Telephone Encounter (Addendum)
Prescription refill request for Eliquis received. Indication: Afib  Last office visit: 05/23/22 Molli Hazard)  Scr: 2.1 (07/25/22 via CareEverywhere per Laural Golden, PahrmD)  Age: 77 Weight: 112.5kg  Called pt and made him aware refill will be sent to requested pharmacy Confirmed appropriate pharmacy and dose with pt.  Appropriate dose. Refill sent.

## 2022-10-16 NOTE — Telephone Encounter (Signed)
Pt called in stating he is supposed to have lab done before he's able to get a refill on his eliquis. He states he got them done last week at the lab across from [facility] and wants to know if Dr. Allyson Sabal has those results yet. Please advise.

## 2022-10-21 ENCOUNTER — Ambulatory Visit: Payer: Medicare Other | Admitting: Urology

## 2022-11-24 ENCOUNTER — Other Ambulatory Visit: Payer: Self-pay | Admitting: Cardiovascular Disease

## 2022-12-04 DIAGNOSIS — R809 Proteinuria, unspecified: Secondary | ICD-10-CM | POA: Diagnosis not present

## 2022-12-04 DIAGNOSIS — E211 Secondary hyperparathyroidism, not elsewhere classified: Secondary | ICD-10-CM | POA: Diagnosis not present

## 2022-12-04 DIAGNOSIS — E870 Hyperosmolality and hypernatremia: Secondary | ICD-10-CM | POA: Diagnosis not present

## 2022-12-04 DIAGNOSIS — I129 Hypertensive chronic kidney disease with stage 1 through stage 4 chronic kidney disease, or unspecified chronic kidney disease: Secondary | ICD-10-CM | POA: Diagnosis not present

## 2022-12-04 DIAGNOSIS — I701 Atherosclerosis of renal artery: Secondary | ICD-10-CM | POA: Diagnosis not present

## 2022-12-15 DIAGNOSIS — N280 Ischemia and infarction of kidney: Secondary | ICD-10-CM | POA: Diagnosis not present

## 2022-12-15 DIAGNOSIS — I129 Hypertensive chronic kidney disease with stage 1 through stage 4 chronic kidney disease, or unspecified chronic kidney disease: Secondary | ICD-10-CM | POA: Diagnosis not present

## 2022-12-15 DIAGNOSIS — R809 Proteinuria, unspecified: Secondary | ICD-10-CM | POA: Diagnosis not present

## 2022-12-15 DIAGNOSIS — N1832 Chronic kidney disease, stage 3b: Secondary | ICD-10-CM | POA: Diagnosis not present

## 2022-12-17 ENCOUNTER — Ambulatory Visit: Payer: Medicare Other | Admitting: Urology

## 2022-12-22 ENCOUNTER — Ambulatory Visit: Payer: Medicare Other | Admitting: Urology

## 2022-12-22 DIAGNOSIS — N400 Enlarged prostate without lower urinary tract symptoms: Secondary | ICD-10-CM

## 2022-12-22 DIAGNOSIS — N289 Disorder of kidney and ureter, unspecified: Secondary | ICD-10-CM

## 2022-12-22 DIAGNOSIS — R972 Elevated prostate specific antigen [PSA]: Secondary | ICD-10-CM

## 2023-02-24 ENCOUNTER — Ambulatory Visit: Payer: Medicare Other | Admitting: Urology

## 2023-03-19 DIAGNOSIS — N189 Chronic kidney disease, unspecified: Secondary | ICD-10-CM | POA: Diagnosis not present

## 2023-04-14 ENCOUNTER — Other Ambulatory Visit: Payer: Self-pay | Admitting: Cardiovascular Disease

## 2023-05-18 ENCOUNTER — Ambulatory Visit: Payer: Medicare Other | Admitting: Cardiovascular Disease

## 2023-05-26 ENCOUNTER — Ambulatory Visit: Payer: Medicare Other | Admitting: Urology

## 2023-06-07 ENCOUNTER — Other Ambulatory Visit: Payer: Self-pay | Admitting: Cardiovascular Disease

## 2023-06-07 DIAGNOSIS — I482 Chronic atrial fibrillation, unspecified: Secondary | ICD-10-CM

## 2023-06-08 NOTE — Telephone Encounter (Signed)
Prescription refill request for Eliquis received. Indication:afib Last office visit:1/24 [facility] Age: 78 Weight:112.5  KG  PRESCRIPTION REFILLED

## 2023-06-11 DIAGNOSIS — D631 Anemia in chronic kidney disease: Secondary | ICD-10-CM | POA: Diagnosis not present

## 2023-06-11 DIAGNOSIS — N1832 Chronic kidney disease, stage 3b: Secondary | ICD-10-CM | POA: Diagnosis not present

## 2023-06-11 DIAGNOSIS — R809 Proteinuria, unspecified: Secondary | ICD-10-CM | POA: Diagnosis not present

## 2023-06-18 DIAGNOSIS — R809 Proteinuria, unspecified: Secondary | ICD-10-CM | POA: Diagnosis not present

## 2023-06-18 DIAGNOSIS — N280 Ischemia and infarction of kidney: Secondary | ICD-10-CM | POA: Diagnosis not present

## 2023-06-18 DIAGNOSIS — N1832 Chronic kidney disease, stage 3b: Secondary | ICD-10-CM | POA: Diagnosis not present

## 2023-06-18 DIAGNOSIS — I129 Hypertensive chronic kidney disease with stage 1 through stage 4 chronic kidney disease, or unspecified chronic kidney disease: Secondary | ICD-10-CM | POA: Diagnosis not present

## 2023-06-23 ENCOUNTER — Ambulatory Visit: Payer: Medicare Other | Admitting: Cardiovascular Disease

## 2023-06-29 ENCOUNTER — Encounter: Payer: Self-pay | Admitting: Cardiovascular Disease

## 2023-06-29 ENCOUNTER — Ambulatory Visit: Payer: Medicare Other | Attending: Cardiovascular Disease | Admitting: Cardiovascular Disease

## 2023-06-29 VITALS — BP 120/56 | HR 74 | Ht 71.0 in | Wt 249.0 lb

## 2023-06-29 DIAGNOSIS — E782 Mixed hyperlipidemia: Secondary | ICD-10-CM

## 2023-06-29 DIAGNOSIS — I482 Chronic atrial fibrillation, unspecified: Secondary | ICD-10-CM

## 2023-06-29 DIAGNOSIS — I10 Essential (primary) hypertension: Secondary | ICD-10-CM | POA: Diagnosis not present

## 2023-06-29 DIAGNOSIS — I701 Atherosclerosis of renal artery: Secondary | ICD-10-CM | POA: Diagnosis not present

## 2023-06-29 LAB — CBC

## 2023-06-29 NOTE — Progress Notes (Signed)
06/29/2023 DONTREAL MIERA   1946/01/27  [id number]  Primary Physician Assunta Found, MD Primary Cardiologist: Runell Gess MD FACP, [place], [place], [place]  HPI:  Steven Scott is a 78 y.o.  moderately overweight, married Caucasian male, father of 2 who I last saw in the office 09/04/2021.  He has a history of persistent hypertension status post bilateral renal artery PTA and stenting with a positive Myoview, as well as heart catheterization that showed a high-grade distal RCA stenosis. I stented both his renal arteries successfully but was unable to cross his distal RCA, which Dr. Tresa Endo subsequently performed successfully on March 28, 2008, with a Xience V drug-eluting stent with an excellent result. He clinically improved. He had an excellent lipid profile. Renal Dopplers performed January 31, 2011, showed progression of disease in both renal arteries suggesting high "in-stent restenosis." I performed angiography on him October 18 revealing 90% right and 70% left in-stent restenosis with 60 and 40-mm gradients, respectively. He underwent AngioSculpt cutting balloon atherectomy of both renal arteries with an excellent angiographic and followup Doppler result. His blood pressure has remained stable. He is otherwise asymptomatic. He was changed to generic antihypertensive medications off of Tribenzor and apparently his blood pressures have been more difficult to control since that time. He does admit to dietary indiscretion with regards to salt as well.  I saw Steven Scott 04/19/12. He denies chest pain or shortness of breath. Apparently his creatinine has slowly increased now to close to 2. Recent renal Dopplers performed in our office 11/19/12 suggest rapid progression of the in-stent restenosis and throat within the left renal artery stent with a renal/aortic ratio of 7.33. Based on this, I re-angiogram him on 12/14/12 revealing an 80-90% mid in-stent restenosis" within the left renal artery stent  which I restented with a ICast  covered stent. His right renal artery had 60-70% ostial stenosis at that time. I performed cutting balloon angioplasty on the right renal artery in-stent restenosis.Followup Dopplers performed last month revealed normalization of his left renal artery to aortic ratio with mild progression of disease on the right. Since I saw him in the office 6 months ago he's done well .  Renal Dopplers that suggest patent renal stents although his renal dimensions have mildly decreased. Because of high-grade right ICA stenosis I referred him to Dr. Myra Gianotti performed elective endarterectomy on 09/26/15. He had minimal disease on the left. A Myoview stress test on prior to that was nonischemic .     Unfortunately, his wife of 50 years died 02/06/19.  She had Alzheimer's for many years and he was taking care of her.  He does have 2 children that live in [place] but no grandchildren yet.  He did have renal Doppler studies performed 12/29/2018 that shows some progression of disease on the left with known occluded right renal artery.  His renal aortic ratio increased from 5.1-6.4.  Renal dimensions remained stable at 12.2 cm.  His blood pressure has been under good control.   He had a neurosurgical procedure by Dr. Lovell Sheehan for herniated disc.  He currently walks with a walker because of chronic back pain and instability.  Since I saw him a year ago he remained stable.  He denies chest pain or shortness of breath.  He was found to have new onset A-fib and currently is on Eliquis.  He denies chest pain or shortness of breath.  His most recent renal Dopplers performed 09/29/2022 revealed a renal aortic ratio of  6.34 with a repeat renal dimension of 12.36 cm on the left.  His right renal artery is occluded and he has an atrophic right kidney.  Current Meds  Medication Sig   apixaban (ELIQUIS) 5 MG TABS tablet TAKE (1) TABLET BY MOUTH TWICE DAILY.   aspirin EC 81 MG tablet Take 1 tablet (81 mg  total) by mouth daily. Start on 11/15/14 (Patient taking differently: Take 81 mg by mouth every morning. Start on 11/15/14)   atorvastatin (LIPITOR) 40 MG tablet TAKE ONE TABLET BY MOUTH ONCE DAILY.   calcitRIOL (ROCALTROL) 0.25 MCG capsule Take 0.25 mcg by mouth 3 (three) times a week.   diphenhydrAMINE (BENADRYL) 25 MG tablet Take 25 mg by mouth at bedtime.   docusate sodium (COLACE) 100 MG capsule Take 1 capsule (100 mg total) by mouth 2 (two) times daily.   gabapentin (NEURONTIN) 300 MG capsule TAKE 1 CAPSULE BY MOUTHOTHREE TIMES A DAY.   HYDROcodone-acetaminophen (NORCO/VICODIN) 5-325 MG tablet Take 1 tablet by mouth every 6 (six) hours as needed.   nebivolol (BYSTOLIC) 5 MG tablet Take 1 tablet (5 mg total) by mouth every morning.   Olmesartan-amLODIPine-HCTZ 40-10-25 MG TABS TAKE 1 TABLET BY MOUTH DAILY WITH BREAKFAST.   pantoprazole (PROTONIX) 40 MG tablet Take 1 tablet (40 mg total) by mouth daily before breakfast.     Allergies  Allergen Reactions   Penicillins Rash    Social History   Socioeconomic History   Marital status: Widowed    Spouse name: Not on file   Number of children: 22   Years of education: Not on file   Highest education level: Not on file  Occupational History   Occupation: retired  Tobacco Use   Smoking status: Former    Current packs/day: 0.00    Average packs/day: 2.0 packs/day for 50.0 years (100.0 ttl pk-yrs)    Types: Cigarettes    Start date: 02/23/1961    Quit date: 02/24/2011    Years since quitting: 12.3   Smokeless tobacco: Never  Vaping Use   Vaping status: Never Used  Substance and Sexual Activity   Alcohol use: No    Alcohol/week: 0.0 standard drinks of alcohol    Comment: 12/13/2012 "last alcohol was ~ 4 yr ago"   Drug use: No   Sexual activity: Not Currently  Other Topics Concern   Not on file  Social History Narrative   Not on file   Social Drivers of Health   Financial Resource Strain: Not on file  Food Insecurity: Not on  file  Transportation Needs: Not on file  Physical Activity: Not on file  Stress: Not on file  Social Connections: Not on file  Intimate Partner Violence: Not on file     Review of Systems: General: negative for chills, fever, night sweats or weight changes.  Cardiovascular: negative for chest pain, dyspnea on exertion, edema, orthopnea, palpitations, paroxysmal nocturnal dyspnea or shortness of breath Dermatological: negative for rash Respiratory: negative for cough or wheezing Urologic: negative for hematuria Abdominal: negative for nausea, vomiting, diarrhea, bright red blood per rectum, melena, or hematemesis Neurologic: negative for visual changes, syncope, or dizziness All other systems reviewed and are otherwise negative except as noted above.    Blood pressure (!) 120/56, pulse 74, height 5\' 11"  (1.803 m), weight 249 lb (112.9 kg), SpO2 97%.  General appearance: alert and no distress Neck: no adenopathy, no carotid bruit, no JVD, supple, symmetrical, trachea midline, and thyroid not enlarged, symmetric, no tenderness/mass/nodules Lungs: clear to  auscultation bilaterally Heart: irregularly irregular rhythm Extremities: extremities normal, atraumatic, no cyanosis or edema Pulses: 2+ and symmetric Skin: Skin color, texture, turgor normal. No rashes or lesions Neurologic: Grossly normal  EKG EKG Interpretation Date/Time:  Monday June 29 2023 10:08:35 EST Ventricular Rate:  74 PR Interval:    QRS Duration:  86 QT Interval:  354 QTC Calculation: 392 R Axis:   49  Text Interpretation: Atrial fibrillation Septal infarct (cited on or before 19-Sep-2015) When compared with ECG of 19-Sep-2015 10:17, No significant change was found Confirmed by Nanetta Batty [phone]) on 06/29/2023 10:09:33 AM    ASSESSMENT AND PLAN:   Hyperlipidemia History of hyperlipidemia on atorvastatin with lipid profile performed 11/22/2021 revealing total cholesterol 94, LDL 42 and HDL 38.  CAD  -S/P RCA DES 2010 History of CAD status post RCA stenosis with stenting by Dr. Tresa Endo 03/28/2008 with a Xience drug-eluting stent.  He has been asymptomatic since.  He denies chest pain or shortness of breath.  Essential hypertension History of essential hypertension her blood pressure measured today at 120/56.  He is on Bystolic.  Renal artery stenosis (HCC) History of bilateral renal artery stenosis status post remote bilateral PTA and stenting with 3 intervention in 2012 and 2014 using Cutting Balloon atherectomy on the right and iCAST covered stenting on the left.  His right renal artery is noted to be occluded with an atrophic right kidney.  His left renal artery revealed moderate stenosis by duplex 09/29/2022 with kidney length of 12.36 cm.  Will recheck this this coming May.  Her serum creatinine is in the 1.7 range.  Chronic atrial fibrillation (HCC) History of persistent A-fib rate controlled on Eliquis oral anticoagulation.     Runell Gess MD FACP,FACC,FAHA, [facility] 06/29/2023 10:24 AM

## 2023-06-29 NOTE — Assessment & Plan Note (Signed)
History of bilateral renal artery stenosis status post remote bilateral PTA and stenting with 3 intervention in 2012 and 2014 using Cutting Balloon atherectomy on the right and iCAST covered stenting on the left.  His right renal artery is noted to be occluded with an atrophic right kidney.  His left renal artery revealed moderate stenosis by duplex 09/29/2022 with kidney length of 12.36 cm.  Will recheck this this coming May.  Her serum creatinine is in the 1.7 range.

## 2023-06-29 NOTE — Patient Instructions (Addendum)
Medication Instructions:  Your physician recommends that you continue on your current medications as directed. Please refer to the Current Medication list given to you today.  *If you need a refill on your cardiac medications before your next appointment, please call your pharmacy*   Lab Work: Your physician recommends that you have labs drawn today: BMET & CBC  If you have labs (blood work) drawn today and your tests are completely normal, you will receive your results only by: MyChart Message (if you have MyChart) OR A paper copy in the mail If you have any lab test that is abnormal or we need to change your treatment, we will call you to review the results.   Testing/Procedures: Your physician has requested that you have a renal artery duplex. During this test, an ultrasound is used to evaluate blood flow to the kidneys. Take your medications as you usually do. **To do in May**  No food after 11PM the night before.  Water is OK. (Don't drink liquids if you have been instructed not to for ANOTHER test). Avoid foods that produce bowel gas, for 24 hours prior to exam (see below). No breakfast, no chewing gum, no smoking or carbonated beverages. Patient may take morning medications with water. Come in for test at least 15 minutes early to register.  Please note: We ask at that you not bring children with you during ultrasound (echo/ vascular) testing. Due to room size and safety concerns, children are not allowed in the ultrasound rooms during exams. Our front office staff cannot provide observation of children in our lobby area while testing is being conducted. An adult accompanying a patient to their appointment will only be allowed in the ultrasound room at the discretion of the ultrasound technician under special circumstances. We apologize for any inconvenience.    Follow-Up: At [facility], you and your health needs are our priority.  As part of our continuing mission to  provide you with exceptional heart care, we have created designated Provider Care Teams.  These Care Teams include your primary Cardiologist (physician) and Advanced Practice Providers (APPs -  Physician Assistants and Nurse Practitioners) who all work together to provide you with the care you need, when you need it.  We recommend signing up for the patient portal called "MyChart".  Sign up information is provided on this After Visit Summary.  MyChart is used to connect with patients for Virtual Visits (Telemedicine).  Patients are able to view lab/test results, encounter notes, upcoming appointments, etc.  Non-urgent messages can be sent to your provider as well.   To learn more about what you can do with MyChart, go to [email or web address].    Your next appointment:   3 month(s)  Provider:   Nanetta Batty, MD     Other Instructions

## 2023-06-29 NOTE — Assessment & Plan Note (Signed)
History of essential hypertension her blood pressure measured today at 120/56.  He is on Bystolic.

## 2023-06-29 NOTE — Assessment & Plan Note (Signed)
History of CAD status post RCA stenosis with stenting by Dr. Tresa Endo 03/28/2008 with a Xience drug-eluting stent.  He has been asymptomatic since.  He denies chest pain or shortness of breath.

## 2023-06-29 NOTE — Assessment & Plan Note (Signed)
History of persistent A-fib rate controlled on Eliquis oral anticoagulation. 

## 2023-06-29 NOTE — Assessment & Plan Note (Signed)
History of hyperlipidemia on atorvastatin with lipid profile performed 11/22/2021 revealing total cholesterol 94, LDL 42 and HDL 38.

## 2023-06-30 LAB — BASIC METABOLIC PANEL
BUN/Creatinine Ratio: 15 (ref 10–24)
BUN: 30 mg/dL — ABNORMAL HIGH (ref 8–27)
CO2: 22 mmol/L (ref 20–29)
Calcium: 9.3 mg/dL (ref 8.6–10.2)
Chloride: 105 mmol/L (ref 96–106)
Creatinine, Ser: 2.02 mg/dL — ABNORMAL HIGH (ref 0.76–1.27)
Glucose: 99 mg/dL (ref 70–99)
Potassium: 4.5 mmol/L (ref 3.5–5.2)
Sodium: 147 mmol/L — ABNORMAL HIGH (ref 134–144)
eGFR: 33 mL/min/{1.73_m2} — ABNORMAL LOW (ref >59)

## 2023-06-30 LAB — CBC
Hematocrit: 48.7 % (ref 37.5–51.0)
Hemoglobin: 15.5 g/dL (ref 13.0–17.7)
MCH: 29.1 pg (ref 26.6–33.0)
MCHC: 31.8 g/dL (ref 31.5–35.7)
MCV: 91 fL (ref 79–97)
Platelets: 241 10*3/uL (ref 150–450)
RBC: 5.33 x10E6/uL (ref 4.14–5.80)
RDW: 13.3 % (ref 11.6–15.4)
WBC: 11 10*3/uL — ABNORMAL HIGH (ref 3.4–10.8)

## 2023-07-05 ENCOUNTER — Other Ambulatory Visit: Payer: Self-pay | Admitting: Cardiovascular Disease

## 2023-07-08 DIAGNOSIS — R7309 Other abnormal glucose: Secondary | ICD-10-CM | POA: Diagnosis not present

## 2023-07-08 DIAGNOSIS — I119 Hypertensive heart disease without heart failure: Secondary | ICD-10-CM | POA: Diagnosis not present

## 2023-07-08 DIAGNOSIS — E782 Mixed hyperlipidemia: Secondary | ICD-10-CM | POA: Diagnosis not present

## 2023-07-08 DIAGNOSIS — I10 Essential (primary) hypertension: Secondary | ICD-10-CM | POA: Diagnosis not present

## 2023-07-08 DIAGNOSIS — Z0000 Encounter for general adult medical examination without abnormal findings: Secondary | ICD-10-CM | POA: Diagnosis not present

## 2023-07-08 DIAGNOSIS — I2510 Atherosclerotic heart disease of native coronary artery without angina pectoris: Secondary | ICD-10-CM | POA: Diagnosis not present

## 2023-07-08 DIAGNOSIS — N184 Chronic kidney disease, stage 4 (severe): Secondary | ICD-10-CM | POA: Diagnosis not present

## 2023-07-14 ENCOUNTER — Ambulatory Visit: Payer: Medicare Other | Admitting: Urology

## 2023-08-04 ENCOUNTER — Other Ambulatory Visit: Payer: Self-pay | Admitting: Cardiovascular Disease

## 2023-08-04 DIAGNOSIS — I482 Chronic atrial fibrillation, unspecified: Secondary | ICD-10-CM

## 2023-08-04 NOTE — Telephone Encounter (Signed)
 Prescription refill request for Eliquis received. Indication: a fib Last office visit: 06/29/23 Scr: 2.02 [facility] 06/29/23 Age: 78 Weight: 112kg

## 2023-08-20 ENCOUNTER — Other Ambulatory Visit: Payer: Self-pay | Admitting: Cardiovascular Disease

## 2023-09-01 ENCOUNTER — Ambulatory Visit: Admitting: Urology

## 2023-09-01 ENCOUNTER — Encounter: Payer: Self-pay | Admitting: Urology

## 2023-09-01 VITALS — BP 160/62 | HR 81

## 2023-09-01 DIAGNOSIS — R350 Frequency of micturition: Secondary | ICD-10-CM

## 2023-09-01 DIAGNOSIS — R972 Elevated prostate specific antigen [PSA]: Secondary | ICD-10-CM

## 2023-09-01 DIAGNOSIS — R3915 Urgency of urination: Secondary | ICD-10-CM | POA: Diagnosis not present

## 2023-09-01 LAB — BLADDER SCAN AMB NON-IMAGING: Scan Result: 9

## 2023-09-01 NOTE — Progress Notes (Signed)
 Bladder Scan completed today.  Patient can void prior to the bladder scan. Bladder scan result: 9  Performed By: Melvenia Stabs. CMA  Additional notes-

## 2023-09-01 NOTE — Progress Notes (Signed)
 History of Present Illness:   2.15.2022: Steven Scott is here today following referral of his elevated PSA. He was last seen 3.5 years ago for BPH & elevated PSA. He denies any significant urinary complaints at this time.   PSA Trend Data: 09.15.11 - 3.47 09.25.12 - 2.91 04.29.14 - 3.91 10.27.15 - 4.46 06/13/2014--4.91 71 % free  12/21/2015--3.6  12/23/2016--3.3  12.03.21 - 5.5 I recommended continued follow-up with DRE/PSA without for ultrasound and biopsy at this time.  4.22.2025: Here for f/u--1st time in 2 years. PSA then was 5.3.  He does have urinary urgency and occasional urgency incontinence.  Significant mobility restriction.  IPSS 6/1.  Not on any medical therapy for LUTS.  He has not been treated for any urinary tract infections since his last visit in February, 2023.   Past Medical History:  Diagnosis Date   Arthritis    Atrial fibrillation (HCC) 03/2015   Carotid artery occlusion    CKD (chronic kidney disease)    Coronary artery disease    GERD (gastroesophageal reflux disease)    Headache    History of bleeding ulcers    History of kidney stones    Hyperlipidemia    Hypertension    Kidney stone    Pneumonia    Renal artery stenosis (HCC)    Multiple interventions   Seasonal allergies    Shortness of breath    "at any time" (12/13/2012)   Status post percutaneous angioplasty of renal artery 10/18 2012   PV angiogram preformed revealing 90% right and 70% left in stent restenosis with 60mm and 40mm gradients respectively.    Past Surgical History:  Procedure Laterality Date   CARDIAC CATHETERIZATION  03/02/2009   has moderate circumflex and diagonal branch disease with high-grade distal right disease and fail attempt at PCI   COLONOSCOPY  12/18/2010   Procedure: COLONOSCOPY;  Surgeon: Ruby Corporal, MD;  [facility];  Service: Endoscopy;  Laterality: N/A;   COLONOSCOPY N/A 09/21/2019   Procedure: COLONOSCOPY;  Surgeon: Ruby Corporal, MD;  [facility];  Service: Endoscopy;  Laterality: N/A;  1210   CORONARY ANGIOPLASTY WITH STENT PLACEMENT  03/28/2009   Successful PCI od high-grade 95% eccentric tandem plague stenosis in the distal right coronary artery with ultimate insertion of a 3.0 x 18 mm Xience V DES stent postdilated at 3.46 mm with the 99% stenosis being reduced to 0%   ENDARTERECTOMY Right 09/26/2015   Procedure: RIGHT CAROTID ARTERY ENDARTERECTOMY ;  Surgeon: Richrd Char, MD;  Location: [facility] OR;  Service: Vascular;  Laterality: Right;   ESOPHAGOGASTRODUODENOSCOPY N/A 11/08/2014   Procedure: ESOPHAGOGASTRODUODENOSCOPY (EGD);  Surgeon: Ruby Corporal, MD;  [facility];  Service: Endoscopy;  Laterality: N/A;   ESOPHAGOGASTRODUODENOSCOPY N/A 03/15/2015   Procedure: ESOPHAGOGASTRODUODENOSCOPY (EGD);  Surgeon: Ruby Corporal, MD;  [facility];  Service: Endoscopy;  Laterality: N/A;  1255   LITHOTRIPSY     "laser" (12/13/2012)   LUMBAR LAMINECTOMY/DECOMPRESSION MICRODISCECTOMY Bilateral 02/15/2020   Procedure: BILATERAL LUMBAR THREE- LUMBAR FOUR LAMINOTOMY, FORAMINOTOMY, LEFT LUMBAR THREE- LUMBAR FOUR DISKECTOMY;  Surgeon: Garry Kansas, MD;  Location: [facility] OR;  Service: Neurosurgery;  Laterality: Bilateral;  3C   PATCH ANGIOPLASTY Right 09/26/2015   Procedure: WITH HEMASHIELD PATCH ANGIOPLASTY;  Surgeon: Richrd Char, MD;  Location: [facility] OR;  Service: Vascular;  Laterality: Right;   PERCUTANEOUS STENT INTERVENTION Left 12/14/2012   Procedure: PERCUTANEOUS STENT INTERVENTION;  Surgeon: Avanell Leigh, MD;  Location:  [facility] CATH LAB;  Service: Cardiovascular;  Laterality: Left;  left renal artery   PERCUTANEOUS STENT INTERVENTION  02/16/2014   Procedure: PERCUTANEOUS STENT INTERVENTION;  Surgeon: Avanell Leigh, MD;  Location: [facility] CATH LAB;  Service: Cardiovascular;;  left renal   POLYPECTOMY  09/21/2019   Procedure: POLYPECTOMY;  Surgeon: Ruby Corporal, MD;  [facility];  Service: Endoscopy;;    RENAL ANGIOGRAM N/A 12/14/2012   Procedure: RENAL ANGIOGRAM;  Surgeon: Avanell Leigh, MD;  Location: [facility] C[facility] LAB;  Service: Cardiovascular;  Laterality: N/A;   RENAL ANGIOGRAM N/A 02/16/2014   Procedure: RENAL ANGIOGRAM;  Surgeon: Avanell Leigh, MD;  Location: [facility] CATH LAB;  Service: Cardiovascular;  Laterality: N/A;   RENAL ARTERY ANGIOPLASTY Bilateral 02/2014   PV restenting L RA with an ICast covered stent and cutting balloon atherectomy R RA   RENAL ARTERY STENT Bilateral 2010    Home Medications:  Allergies as of 09/01/2023       Reactions   Penicillins Rash        Medication List        Accurate as of September 01, 2023  7:06 AM. If you have any questions, ask your nurse or doctor.          aspirin  EC 81 MG tablet Take 1 tablet (81 mg total) by mouth daily. Start on 11/15/14 What changed: when to take this   atorvastatin  40 MG tablet Commonly known as: LIPITOR TAKE ONE TABLET BY MOUTH ONCE DAILY.   calcitRIOL 0.25 MCG capsule Commonly known as: ROCALTROL Take 0.25 mcg by mouth 3 (three) times a week.   clobetasol  cream 0.05 % Commonly known as: TEMOVATE  Apply 1 application topically daily as needed (Psoriasis).   diphenhydrAMINE  25 MG tablet Commonly known as: BENADRYL  Take 25 mg by mouth at bedtime.   docusate sodium  100 MG capsule Commonly known as: COLACE Take 1 capsule (100 mg total) by mouth 2 (two) times daily.   Eliquis  5 MG Tabs tablet Generic drug: apixaban  TAKE (1) TABLET BY MOUTH TWICE DAILY.   gabapentin 300 MG capsule Commonly known as: NEURONTIN TAKE 1 CAPSULE BY MOUTHOTHREE TIMES A DAY.   HYDROcodone-acetaminophen  5-325 MG tablet Commonly known as: NORCO/VICODIN Take 1 tablet by mouth every 6 (six) hours as needed.   nebivolol  5 MG tablet Commonly known as: BYSTOLIC  Take 1 tablet (5 mg total) by mouth every morning.   Olmesartan -amLODIPine -HCTZ 40-10-25 MG Tabs Take 1 tablet by mouth daily with breakfast.   pantoprazole  40 MG  tablet Commonly known as: PROTONIX  Take 1 tablet (40 mg total) by mouth daily before breakfast.        Allergies:  Allergies  Allergen Reactions   Penicillins Rash    Family History  Problem Relation Age of Onset   COPD Father 57       deceased   Kidney failure Father     Social History:  reports that he quit smoking about 12 years ago. His smoking use included cigarettes. He started smoking about 62 years ago. He has a 100 pack-year smoking history. He has never used smokeless tobacco. He reports that he does not drink alcohol and does not use drugs.  ROS: A complete review of systems was performed.  All systems are negative except for pertinent findings as noted.  Physical Exam:  Vital signs in last 24 hours: There were no vitals taken for this visit. Constitutional:  Alert and oriented, No acute distress Cardiovascular: Regular rate  Respiratory: Normal  respiratory effort Genitourinary: Normal anal sphincter tone.  Prostate 60 g, symmetric.  No nodules or tenderness.  No rectal masses. Lymphatic: No lymphadenopathy Neurologic: Grossly intact, no focal deficits Psychiatric: Normal mood and affect  I have reviewed prior pt notes  I have reviewed urinalysis results--clear  I have independently reviewed bladder-residual urine volume 9 mL  I have reviewed prior PSA results     Impression/Assessment:  1.  Elevated PSA in a 78 year old.  Appropriate sized gland.  I do not think we need further PSA screening  2.  LUTS-frequency, urgency.  He empties well.  Leakage is most likely secondary to poor mobility  Plan:  1.  Reassurance about exam.  We do not need to check PSA anymore  2.  At his request, I will have him come back in a year for recheck

## 2023-09-02 LAB — URINALYSIS, ROUTINE W REFLEX MICROSCOPIC
Bilirubin, UA: NEGATIVE
Glucose, UA: NEGATIVE
Ketones, UA: NEGATIVE
Leukocytes,UA: NEGATIVE
Nitrite, UA: NEGATIVE
RBC, UA: NEGATIVE
Specific Gravity, UA: 1.02 (ref 1.005–1.030)
Urobilinogen, Ur: 1 mg/dL (ref 0.2–1.0)
pH, UA: 6 (ref 5.0–7.5)

## 2023-09-30 DIAGNOSIS — R809 Proteinuria, unspecified: Secondary | ICD-10-CM | POA: Diagnosis not present

## 2023-09-30 DIAGNOSIS — E559 Vitamin D deficiency, unspecified: Secondary | ICD-10-CM | POA: Diagnosis not present

## 2023-09-30 DIAGNOSIS — N189 Chronic kidney disease, unspecified: Secondary | ICD-10-CM | POA: Diagnosis not present

## 2023-09-30 DIAGNOSIS — D631 Anemia in chronic kidney disease: Secondary | ICD-10-CM | POA: Diagnosis not present

## 2023-09-30 DIAGNOSIS — E211 Secondary hyperparathyroidism, not elsewhere classified: Secondary | ICD-10-CM | POA: Diagnosis not present

## 2023-10-02 ENCOUNTER — Ambulatory Visit: Payer: Self-pay | Admitting: Cardiovascular Disease

## 2023-10-02 ENCOUNTER — Ambulatory Visit (HOSPITAL_COMMUNITY)
Admission: RE | Admit: 2023-10-02 | Discharge: 2023-10-02 | Disposition: A | Discharge status: Home / Self Care | Payer: Medicare Other | Source: Ambulatory Visit | Attending: Cardiovascular Disease | Admitting: Cardiovascular Disease

## 2023-10-02 DIAGNOSIS — I701 Atherosclerosis of renal artery: Secondary | ICD-10-CM | POA: Insufficient documentation

## 2023-10-02 DIAGNOSIS — E782 Mixed hyperlipidemia: Secondary | ICD-10-CM | POA: Insufficient documentation

## 2023-10-03 ENCOUNTER — Other Ambulatory Visit: Payer: Self-pay | Admitting: General Practice

## 2023-10-07 DIAGNOSIS — R809 Proteinuria, unspecified: Secondary | ICD-10-CM | POA: Diagnosis not present

## 2023-10-07 DIAGNOSIS — N280 Ischemia and infarction of kidney: Secondary | ICD-10-CM | POA: Diagnosis not present

## 2023-10-07 DIAGNOSIS — N1832 Chronic kidney disease, stage 3b: Secondary | ICD-10-CM | POA: Diagnosis not present

## 2023-10-07 DIAGNOSIS — I129 Hypertensive chronic kidney disease with stage 1 through stage 4 chronic kidney disease, or unspecified chronic kidney disease: Secondary | ICD-10-CM | POA: Diagnosis not present

## 2023-10-19 ENCOUNTER — Encounter: Payer: Self-pay | Admitting: Cardiovascular Disease

## 2023-10-19 ENCOUNTER — Ambulatory Visit: Payer: Medicare Other | Attending: Cardiology | Admitting: Cardiovascular Disease

## 2023-10-19 VITALS — BP 148/62 | HR 82 | Ht 71.0 in | Wt 249.0 lb

## 2023-10-19 DIAGNOSIS — I482 Chronic atrial fibrillation, unspecified: Secondary | ICD-10-CM

## 2023-10-19 DIAGNOSIS — Z9861 Coronary angioplasty status: Secondary | ICD-10-CM | POA: Diagnosis not present

## 2023-10-19 DIAGNOSIS — I6523 Occlusion and stenosis of bilateral carotid arteries: Secondary | ICD-10-CM

## 2023-10-19 DIAGNOSIS — I701 Atherosclerosis of renal artery: Secondary | ICD-10-CM | POA: Diagnosis not present

## 2023-10-19 DIAGNOSIS — E782 Mixed hyperlipidemia: Secondary | ICD-10-CM | POA: Diagnosis not present

## 2023-10-19 DIAGNOSIS — I2510 Atherosclerotic heart disease of native coronary artery without angina pectoris: Secondary | ICD-10-CM | POA: Diagnosis not present

## 2023-10-19 DIAGNOSIS — I10 Essential (primary) hypertension: Secondary | ICD-10-CM | POA: Diagnosis not present

## 2023-10-19 NOTE — Progress Notes (Signed)
 10/19/2023 Steven Scott   [birth year]  [id number]  Primary Physician Minus Amel, MD Primary Cardiologist: Steven Leigh MD FACP, [place], [place], [place]  HPI:  Steven Scott is a 78 y.o.  moderately overweight, married Caucasian male, father of 2 who I last saw in the office 06/29/2023.  He has a history of persistent hypertension status post bilateral renal artery PTA and stenting with a positive Myoview, as well as heart catheterization that showed a high-grade distal RCA stenosis. I stented both his renal arteries successfully but was unable to cross his distal RCA, which Dr. Loetta Scott subsequently performed successfully on March 28, 2008, with a Xience V drug-eluting stent with an excellent result. He clinically improved. He had an excellent lipid profile. Renal Dopplers performed January 31, 2011, showed progression of disease in both renal arteries suggesting high "in-stent restenosis." I performed angiography on him October 18 revealing 90% right and 70% left in-stent restenosis with 60 and 40-mm gradients, respectively. He underwent AngioSculpt cutting balloon atherectomy of both renal arteries with an excellent angiographic and followup Doppler result. His blood pressure has remained stable. He is otherwise asymptomatic. He was changed to generic antihypertensive medications off of Tribenzor and apparently his blood pressures have been more difficult to control since that time. He does admit to dietary indiscretion with regards to salt as well.   I saw Steven Scott 04/19/12. He denies chest pain or shortness of breath. Apparently his creatinine has slowly increased now to close to 2. Recent renal Dopplers performed in our office 11/19/12 suggest rapid progression of the in-stent restenosis and throat within the left renal artery stent with a renal/aortic ratio of 7.33. Based on this, I re-angiogram him on 12/14/12 revealing an 80-90% mid in-stent restenosis" within the left renal artery stent  which I restented with a ICast  covered stent. His right renal artery had 60-70% ostial stenosis at that time. I performed cutting balloon angioplasty on the right renal artery in-stent restenosis.Followup Dopplers performed last month revealed normalization of his left renal artery to aortic ratio with mild progression of disease on the right. Since I saw him in the office 6 months ago he's done well .  Renal Dopplers that suggest patent renal stents although his renal dimensions have mildly decreased. Because of high-grade right ICA stenosis I referred him to Dr. Charlotte Scott performed elective endarterectomy on 09/26/15. He had minimal disease on the left. A Myoview stress test on prior to that was nonischemic .     Unfortunately, his wife of 50 years died 02/24/19.  She had Alzheimer's for many years and he was taking care of her.  He does have 2 children that live in [place] but no grandchildren yet.  He did have renal Doppler studies performed 12/29/2018 that shows some progression of disease on the left with known occluded right renal artery.  His renal aortic ratio increased from 5.1-6.4.  Renal dimensions remained stable at 12.2 cm.  His blood pressure has been under good control.   He had a neurosurgical procedure by Dr. Larrie Scott for herniated disc.  He currently walks with a walker because of chronic back pain and instability.  Since I saw him in the office 4 months ago he is remained stable.  He remains in A-fib on Eliquis  oral anticoagulation rate controlled.  He walks with a walker mostly because of his back.  He denies chest pain or shortness of breath.  His most recent renal Doppler studies performed 10/02/2023 revealing  an atrophic right kidney with an occluded right renal artery that was known and a left renal aortic ratio of 4.72.   Current Meds  Medication Sig   apixaban  (ELIQUIS ) 5 MG TABS tablet TAKE (1) TABLET BY MOUTH TWICE DAILY.   aspirin  EC 81 MG tablet Take 1 tablet (81 mg total)  by mouth daily. Start on 11/15/14 (Patient taking differently: Take 81 mg by mouth every morning. Start on 11/15/14)   atorvastatin  (LIPITOR) 40 MG tablet TAKE ONE TABLET BY MOUTH ONCE DAILY.   calcitRIOL (ROCALTROL) 0.25 MCG capsule Take 0.25 mcg by mouth 3 (three) times a week.   nebivolol  (BYSTOLIC ) 5 MG tablet Take 1 tablet (5 mg total) by mouth every morning.   Olmesartan -amLODIPine -HCTZ 40-10-25 MG TABS Take 1 tablet by mouth daily with breakfast.   pantoprazole  (PROTONIX ) 40 MG tablet Take 1 tablet (40 mg total) by mouth daily before breakfast.     Allergies  Allergen Reactions   Penicillins Rash    Social History   Socioeconomic History   Marital status: Widowed    Spouse name: Not on file   Number of children: 22   Years of education: Not on file   Highest education level: Not on file  Occupational History   Occupation: retired  Tobacco Use   Smoking status: Former    Current packs/day: 0.00    Average packs/day: 2.0 packs/day for 50.0 years (100.0 ttl pk-yrs)    Types: Cigarettes    Start date: 02/23/1961    Quit date: 02/24/2011    Years since quitting: 12.6   Smokeless tobacco: Never  Vaping Use   Vaping status: Never Used  Substance and Sexual Activity   Alcohol use: No    Alcohol/week: 0.0 standard drinks of alcohol    Comment: 12/13/2012 "last alcohol was ~ 4 yr ago"   Drug use: No   Sexual activity: Not Currently  Other Topics Concern   Not on file  Social History Narrative   Not on file   Social Drivers of Health   Financial Resource Strain: Not on file  Food Insecurity: Not on file  Transportation Needs: Not on file  Physical Activity: Not on file  Stress: Not on file  Social Connections: Not on file  Intimate Partner Violence: Not on file     Review of Systems: General: negative for chills, fever, night sweats or weight changes.  Cardiovascular: negative for chest pain, dyspnea on exertion, edema, orthopnea, palpitations, paroxysmal nocturnal  dyspnea or shortness of breath Dermatological: negative for rash Respiratory: negative for cough or wheezing Urologic: negative for hematuria Abdominal: negative for nausea, vomiting, diarrhea, bright red blood per rectum, melena, or hematemesis Neurologic: negative for visual changes, syncope, or dizziness All other systems reviewed and are otherwise negative except as noted above.    Blood pressure (!) 148/62, pulse 82, height 5\' 11"  (1.803 m), weight 249 lb (112.9 kg), SpO2 94%.  General appearance: alert and no distress Neck: no adenopathy, no carotid bruit, no JVD, supple, symmetrical, trachea midline, and thyroid  not enlarged, symmetric, no tenderness/mass/nodules Lungs: clear to auscultation bilaterally Heart: irregularly irregular rhythm Extremities: extremities normal, atraumatic, no cyanosis or edema Pulses: 2+ and symmetric Skin: Skin color, texture, turgor normal. No rashes or lesions Neurologic: Grossly normal  EKG EKG Interpretation Date/Time:  Monday October 19 2023 10:21:26 EDT Ventricular Rate:  70 PR Interval:    QRS Duration:  78 QT Interval:  352 QTC Calculation: 380 R Axis:   76  Text Interpretation:  Atrial fibrillation Septal infarct (cited on or before 19-Sep-2015) When compared with ECG of 29-Jun-2023 10:08, Nonspecific T wave abnormality now evident in Inferior leads Confirmed by Lauro Portal [id number] on 10/19/2023 10:43:24 AM    ASSESSMENT AND PLAN:   Hyperlipidemia History of hyperlipidemia on statin therapy with lipid profile performed 11/22/2021 Salma Walrond total cholesterol 94, LDL 42 HDL 38, followed by his PCP.  CAD -S/P RCA DES 2010 History of CAD status post cardiac catheterization revealing high-grade distal RCA stenosis.  I was unable to cross the distal RCA which was successfully performed subsequently by Dr. Loetta Scott 03/28/2008 with a Xience 5 drug-eluting stent with excellent result.  He has had no coronary issues since that time.  He denies chest pain  or shortness of breath.  Essential hypertension History of essential hypertension with blood pressure measured today 148/62.  He is on Bystolic , Benicar , amlodipine  and hydrochlorothiazide .  Renal artery stenosis (HCC) History of bilateral renal artery stenosis with stenting remotely.  I agree angiogram to him 02/27/2011 revealing 90% right and 70% left in-stent restenosis.  He underwent angio sculpt Cutting Balloon atherectomy both renal arteries and patent and excellent result.  I reangiogram to him 12/14/2012 revealing 80 to 90% mid in-stent restenosis within the left renal artery stent which I restented using iCAST covered stent.  He had right renal artery had 60 to 70% ostial stenosis but at that time which I performed Cutting Balloon angioplasty on.  His most recent renal Doppler studies performed 10/02/2023 revealed known occluded right renal artery with a left renal aortic ratio of 4.72 which has remained stable and it pole-to-pole kidney left of 11.38.  He does have known cysts bilaterally.  Bilateral carotid artery disease (HCC) History of carotid artery disease status post elective right carotid endarterectomy performed by Dr. Charlotte Scott 09/26/2015.  His most recent carotid Doppler studies performed 09/29/2022 revealed a widely patent endarterectomy site.     Steven Leigh MD FACP,FACC,FAHA, [facility] 10/19/2023 10:52 AM

## 2023-10-19 NOTE — Patient Instructions (Signed)
 Medication Instructions:  Your physician recommends that you continue on your current medications as directed. Please refer to the Current Medication list given to you today.  *If you need a refill on your cardiac medications before your next appointment, please call your pharmacy*   Testing/Procedures: Your physician has requested that you have a renal artery duplex. During this test, an ultrasound is used to evaluate blood flow to the kidneys. Take your medications as you usually do. This will take place at [address] 4th floor.  No food after 11PM the night before.  Water  is OK. (Don't drink liquids if you have been instructed not to for ANOTHER test). Avoid foods that produce bowel gas, for 24 hours prior to exam (see below). No breakfast, no chewing gum, no smoking or carbonated beverages. Patient may take morning medications with water . Come in for test at least 15 minutes early to register.  Please note: We ask at that you not bring children with you during ultrasound (echo/ vascular) testing. Due to room size and safety concerns, children are not allowed in the ultrasound rooms during exams. Our front office staff cannot provide observation of children in our lobby area while testing is being conducted. An adult accompanying a patient to their appointment will only be allowed in the ultrasound room at the discretion of the ultrasound technician under special circumstances. We apologize for any inconvenience. **To be done in May 2026**   Follow-Up: At [facility], you and your health needs are our priority.  As part of our continuing mission to provide you with exceptional heart care, our providers are all part of one team.  This team includes your primary Cardiologist (physician) and Advanced Practice Providers or APPs (Physician Assistants and Nurse Practitioners) who all work together to provide you with the care you need, when you need it.  Your next appointment:    12 month(s)  Provider:   Lauro Portal, MD    We recommend signing up for the patient portal called "MyChart".  Sign up information is provided on this After Visit Summary.  MyChart is used to connect with patients for Virtual Visits (Telemedicine).  Patients are able to view lab/test results, encounter notes, upcoming appointments, etc.  Non-urgent messages can be sent to your provider as well.   To learn more about what you can do with MyChart, go to [email or web address].

## 2023-10-19 NOTE — Assessment & Plan Note (Signed)
 History of bilateral renal artery stenosis with stenting remotely.  I agree angiogram to him 02/27/2011 revealing 90% right and 70% left in-stent restenosis.  He underwent angio sculpt Cutting Balloon atherectomy both renal arteries and patent and excellent result.  I reangiogram to him 12/14/2012 revealing 80 to 90% mid in-stent restenosis within the left renal artery stent which I restented using iCAST covered stent.  He had right renal artery had 60 to 70% ostial stenosis but at that time which I performed Cutting Balloon angioplasty on.  His most recent renal Doppler studies performed 10/02/2023 revealed known occluded right renal artery with a left renal aortic ratio of 4.72 which has remained stable and it pole-to-pole kidney left of 11.38.  He does have known cysts bilaterally.

## 2023-10-19 NOTE — Assessment & Plan Note (Signed)
 History of CAD status post cardiac catheterization revealing high-grade distal RCA stenosis.  I was unable to cross the distal RCA which was successfully performed subsequently by Dr. Loetta Ringer 03/28/2008 with a Xience 5 drug-eluting stent with excellent result.  He has had no coronary issues since that time.  He denies chest pain or shortness of breath.

## 2023-10-19 NOTE — Assessment & Plan Note (Signed)
 History of hyperlipidemia on statin therapy with lipid profile performed 11/22/2021 Steven Scott total cholesterol 94, LDL 42 HDL 38, followed by his PCP.

## 2023-10-19 NOTE — Assessment & Plan Note (Signed)
 History of carotid artery disease status post elective right carotid endarterectomy performed by Dr. Charlotte Cookey 09/26/2015.  His most recent carotid Doppler studies performed 09/29/2022 revealed a widely patent endarterectomy site.

## 2023-10-19 NOTE — Assessment & Plan Note (Signed)
 History of essential hypertension with blood pressure measured today 148/62.  He is on Bystolic , Benicar , amlodipine  and hydrochlorothiazide .

## 2023-11-01 ENCOUNTER — Other Ambulatory Visit: Payer: Self-pay | Admitting: Cardiovascular Disease

## 2023-11-01 DIAGNOSIS — I482 Chronic atrial fibrillation, unspecified: Secondary | ICD-10-CM

## 2023-11-02 NOTE — Telephone Encounter (Signed)
 Prescription refill request for Eliquis  received. Indication:afib Last office visit:6/25 Scr:2.02  2/25 Age: 78 Weight:112.9  kg  Prescription refilled

## 2024-01-05 DIAGNOSIS — E211 Secondary hyperparathyroidism, not elsewhere classified: Secondary | ICD-10-CM | POA: Diagnosis not present

## 2024-01-05 DIAGNOSIS — D631 Anemia in chronic kidney disease: Secondary | ICD-10-CM | POA: Diagnosis not present

## 2024-01-05 DIAGNOSIS — N189 Chronic kidney disease, unspecified: Secondary | ICD-10-CM | POA: Diagnosis not present

## 2024-01-05 DIAGNOSIS — R809 Proteinuria, unspecified: Secondary | ICD-10-CM | POA: Diagnosis not present

## 2024-01-05 DIAGNOSIS — I10 Essential (primary) hypertension: Secondary | ICD-10-CM | POA: Diagnosis not present

## 2024-01-12 DIAGNOSIS — R809 Proteinuria, unspecified: Secondary | ICD-10-CM | POA: Diagnosis not present

## 2024-01-12 DIAGNOSIS — I129 Hypertensive chronic kidney disease with stage 1 through stage 4 chronic kidney disease, or unspecified chronic kidney disease: Secondary | ICD-10-CM | POA: Diagnosis not present

## 2024-01-12 DIAGNOSIS — N1832 Chronic kidney disease, stage 3b: Secondary | ICD-10-CM | POA: Diagnosis not present

## 2024-01-12 DIAGNOSIS — N280 Ischemia and infarction of kidney: Secondary | ICD-10-CM | POA: Diagnosis not present

## 2024-02-04 ENCOUNTER — Telehealth: Payer: Self-pay | Admitting: Cardiovascular Disease

## 2024-02-04 MED ORDER — NEBIVOLOL HCL 5 MG PO TABS: 5.0000 mg | ORAL_TABLET | Freq: Every morning | ORAL | 2 refills | Status: DC

## 2024-02-04 NOTE — Telephone Encounter (Signed)
*  STAT* If patient is at the pharmacy, call can be transferred to refill team.   1. Which medications need to be refilled? (please list name of each medication and dose if known) nebivolol  (BYSTOLIC ) 5 MG tablet  pantoprazole  (PROTONIX ) 40 MG tablet   2. Would you like to learn more about the convenience, safety, & potential cost savings by using the [facility]? No   3. Are you open to using the [facility] (Type [facility].) No   4. Which pharmacy/location (including street and city if local pharmacy) is medication to be sent to? [facility] - [place], [place] - [address]    5. Do they need a 30 day or 90 day supply? 90 day  Pt is out of medications

## 2024-02-04 NOTE — Addendum Note (Signed)
 Addended by: LORING ANDRIETTE HERO on: 02/04/2024 02:53 PM   Modules accepted: Orders

## 2024-02-04 NOTE — Telephone Encounter (Signed)
 Steven Scott w/ [facility] in [place] - has had patient on bystolic  10mg  daily since 12/2022. Advised to fill as prescribed by this provider, as patient has been on this dose routinely. Unclear why refill request was also sent to our office.

## 2024-02-04 NOTE — Telephone Encounter (Signed)
 Bystolic  refilled  Pantoprazole  deferred to PCP  MyChart message sent to patient.

## 2024-02-04 NOTE — Telephone Encounter (Signed)
 For review-meds have not been filled by us  since 2021/22. Are we managing/ok to fill?

## 2024-08-30 ENCOUNTER — Ambulatory Visit: Admitting: Urology

## 2024-09-09 ENCOUNTER — Encounter (HOSPITAL_COMMUNITY)
